# Patient Record
Sex: Female | Born: 1945 | Race: White | Hispanic: No | Marital: Married | Smoking: Former smoker
Health system: Southern US, Community
[De-identification: ages and names within clinical notes are randomized; demographics above are authoritative.]

## PROBLEM LIST (undated history)

## (undated) DIAGNOSIS — Z87442 Personal history of urinary calculi: Secondary | ICD-10-CM

## (undated) DIAGNOSIS — K219 Gastro-esophageal reflux disease without esophagitis: Secondary | ICD-10-CM

## (undated) DIAGNOSIS — F32A Depression, unspecified: Secondary | ICD-10-CM

## (undated) DIAGNOSIS — I82409 Acute embolism and thrombosis of unspecified deep veins of unspecified lower extremity: Secondary | ICD-10-CM

## (undated) DIAGNOSIS — I739 Peripheral vascular disease, unspecified: Secondary | ICD-10-CM

## (undated) DIAGNOSIS — Z9889 Other specified postprocedural states: Secondary | ICD-10-CM

## (undated) DIAGNOSIS — I10 Essential (primary) hypertension: Secondary | ICD-10-CM

## (undated) DIAGNOSIS — Z8619 Personal history of other infectious and parasitic diseases: Secondary | ICD-10-CM

## (undated) DIAGNOSIS — I779 Disorder of arteries and arterioles, unspecified: Secondary | ICD-10-CM

## (undated) DIAGNOSIS — F329 Major depressive disorder, single episode, unspecified: Secondary | ICD-10-CM

## (undated) DIAGNOSIS — D6851 Activated protein C resistance: Secondary | ICD-10-CM

## (undated) DIAGNOSIS — G4730 Sleep apnea, unspecified: Secondary | ICD-10-CM

## (undated) DIAGNOSIS — I219 Acute myocardial infarction, unspecified: Secondary | ICD-10-CM

## (undated) DIAGNOSIS — N179 Acute kidney failure, unspecified: Secondary | ICD-10-CM

## (undated) DIAGNOSIS — L03115 Cellulitis of right lower limb: Secondary | ICD-10-CM

## (undated) DIAGNOSIS — Z8673 Personal history of transient ischemic attack (TIA), and cerebral infarction without residual deficits: Secondary | ICD-10-CM

## (undated) DIAGNOSIS — J189 Pneumonia, unspecified organism: Secondary | ICD-10-CM

## (undated) DIAGNOSIS — E785 Hyperlipidemia, unspecified: Secondary | ICD-10-CM

## (undated) DIAGNOSIS — R112 Nausea with vomiting, unspecified: Secondary | ICD-10-CM

## (undated) DIAGNOSIS — R569 Unspecified convulsions: Secondary | ICD-10-CM

## (undated) DIAGNOSIS — I2510 Atherosclerotic heart disease of native coronary artery without angina pectoris: Secondary | ICD-10-CM

## (undated) DIAGNOSIS — E1165 Type 2 diabetes mellitus with hyperglycemia: Secondary | ICD-10-CM

## (undated) HISTORY — PX: BACK SURGERY: SHX140

## (undated) HISTORY — DX: Disorder of arteries and arterioles, unspecified: I77.9

## (undated) HISTORY — DX: Unspecified convulsions: R56.9

## (undated) HISTORY — PX: CATARACT EXTRACTION: SUR2

## (undated) HISTORY — PX: CHOLECYSTECTOMY: SHX55

## (undated) HISTORY — DX: Personal history of other infectious and parasitic diseases: Z86.19

## (undated) HISTORY — PX: TUBAL LIGATION: SHX77

## (undated) HISTORY — PX: OTHER SURGICAL HISTORY: SHX169

## (undated) HISTORY — PX: ABDOMINAL HYSTERECTOMY: SHX81

## (undated) HISTORY — DX: Peripheral vascular disease, unspecified: I73.9

## (undated) HISTORY — DX: Cellulitis of right lower limb: L03.115

## (undated) HISTORY — PX: LITHOTRIPSY: SUR834

## (undated) HISTORY — PX: TONSILLECTOMY: SUR1361

## (undated) HISTORY — DX: Acute kidney failure, unspecified: N17.9

## (undated) HISTORY — DX: Personal history of transient ischemic attack (TIA), and cerebral infarction without residual deficits: Z86.73

## (undated) HISTORY — PX: CARDIAC CATHETERIZATION: SHX172

---

## 1987-12-18 HISTORY — PX: ABDOMINAL HYSTERECTOMY: SHX81

## 2002-12-17 DIAGNOSIS — Z8673 Personal history of transient ischemic attack (TIA), and cerebral infarction without residual deficits: Secondary | ICD-10-CM

## 2002-12-17 HISTORY — DX: Personal history of transient ischemic attack (TIA), and cerebral infarction without residual deficits: Z86.73

## 2012-03-26 DIAGNOSIS — L403 Pustulosis palmaris et plantaris: Secondary | ICD-10-CM | POA: Insufficient documentation

## 2012-05-31 DIAGNOSIS — N200 Calculus of kidney: Secondary | ICD-10-CM | POA: Insufficient documentation

## 2014-06-22 DIAGNOSIS — M1990 Unspecified osteoarthritis, unspecified site: Secondary | ICD-10-CM | POA: Insufficient documentation

## 2015-12-18 HISTORY — PX: CATARACT EXTRACTION: SUR2

## 2016-01-23 DIAGNOSIS — E7253 Hyperoxaluria: Secondary | ICD-10-CM | POA: Insufficient documentation

## 2016-01-23 DIAGNOSIS — M109 Gout, unspecified: Secondary | ICD-10-CM | POA: Insufficient documentation

## 2016-01-23 DIAGNOSIS — R82994 Hypercalciuria: Secondary | ICD-10-CM | POA: Insufficient documentation

## 2016-01-23 DIAGNOSIS — R82993 Hyperuricosuria: Secondary | ICD-10-CM | POA: Insufficient documentation

## 2016-01-23 DIAGNOSIS — R34 Anuria and oliguria: Secondary | ICD-10-CM | POA: Insufficient documentation

## 2018-03-27 ENCOUNTER — Encounter: Payer: Self-pay | Admitting: Internal Medicine

## 2018-03-27 ENCOUNTER — Ambulatory Visit: Payer: Self-pay | Admitting: Internal Medicine

## 2018-03-27 DIAGNOSIS — E1165 Type 2 diabetes mellitus with hyperglycemia: Secondary | ICD-10-CM | POA: Diagnosis not present

## 2018-03-27 DIAGNOSIS — E1159 Type 2 diabetes mellitus with other circulatory complications: Secondary | ICD-10-CM

## 2018-03-27 LAB — POCT GLYCOSYLATED HEMOGLOBIN (HGB A1C): Hemoglobin A1C: 9.5

## 2018-03-27 MED ORDER — FREESTYLE LIBRE 14 DAY SENSOR MISC: 1.0000 | 11 refills | Status: AC

## 2018-03-27 MED ORDER — FREESTYLE LIBRE 14 DAY READER DEVI: 1.0000 | Freq: Once | 1 refills | Status: AC

## 2018-03-27 NOTE — Progress Notes (Signed)
Patient ID: Adriana [name], female   DOB: 27-May-1946, 72 y.o.   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 She moved from [place] to [place] in 10/2017. Insurance: Hydrologist  Last hemoglobin A1c was: No results found for: HGBA1C  Pt is on a regimen of: - Metformin 1000 mg 2x a day, with meals - Basaglar 24 units at bedtime - Humalog 12 units 3-4x a week (!), before meals  Pt checks her sugars 1x a month (!). - am: n/c - 2h after b'fast: n/c - before lunch: n/c - 2h after lunch: n/c - before dinner: n/c - 2h after dinner: n/c - bedtime: n/c - nighttime: n/c No lows. Lowest sugar was 68; she has hypoglycemia awareness at 70s.  Highest sugar was 215.  Glucometer: One Touch  Pt's meals are: - Brunch: leftovers from dinner, sausage or bacon + scrambled eggs + toast, spaghetti and meatballs, chicken salad + croissant, sandwich - Dinner: hamburger patties, red meat, chicken + fries + veggies - Snacks: fruit, 3 desserts a week; 2 chocolate chip cookies a day  - no CKD, last BUN/creatinine:  No results found for: BUN, CREATININE  On Lisinopril.  - last set of lipids: No results found for: CHOL, HDL, LDLCALC, LDLDIRECT, TRIG, CHOLHDL  On Simvastatin 40.  - last eye exam was in 2018. 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 + macular edema.  + glaucoma, s/p Sx and implants.  - no numbness and tingling in her feet.  Pt has FH of DM in B, MGM.  + palmar-plantar pustular psoriasis. + h/o kidney stones. + h/o thyroid nodule - s/p Benign Bx 2017  ROS: Constitutional: no weight gain/loss, no fatigue, no subjective hyperthermia/hypothermia Eyes: no blurry vision, no xerophthalmia ENT: no sore throat, no nodules palpated in throat, no dysphagia/odynophagia, no hoarseness Cardiovascular: no CP/SOB/palpitations/leg swelling Respiratory:  no cough/SOB Gastrointestinal: no N/V/D/C/+ heartburn Musculoskeletal: + Both: Muscle/joint aches Skin: + Rash on feet  Neurological: no tremors/numbness/tingling/dizziness Psychiatric: no depression/anxiety  Past medical history: Type 2 diabetes GERD Factor V Leiden mutation Osteoarthritis Pulmonary-plantar pustular psoriasis History of kidney stones History of stroke in 2004  Past surgical history: Double carotid artery surgery 2004 Cholecystectomy Lithotripsy- 3 times Back surgery Hysterectomy Tubal ligation Root canals-3 surgeries Cataract surgery x2 with implants  Social History   Socioeconomic History  . Marital status:  Married    Spouse name: Not on file  . Number of children: 3  . Years of education: Not on file  . Highest education level: Not on file  Occupational History  .  Retired from [employer]  . Smoking status:  Former smoker, quit 2004  Substance and Sexual Activity  . Alcohol use: No  . Drug use: No  Lifestyle  Relationships  . Social connections:    Talks on phone: Not on file    Gets together: Not on file    Attends religious service: Not on file    Active member of club or organization: Not on file    Attends meetings of clubs or organizations: Not on file    Relationship status: Not on file  . Intimate partner violence:    Fear of current or ex partner: Not on file    Emotionally abused: Not on file    Physically abused: Not on file    Forced sexual activity: Not on file  Other Topics Concern  . Not on file  Social History Narrative  . Not on file   Current Outpatient Medications on File Prior to Visit  Medication Sig Dispense Refill  . chlorthalidone (HYGROTON) 25 MG tablet Take 25 mg by mouth daily.    . Cholecalciferol (VITAMIN D) 2000 units CAPS Take by mouth.    . clobetasol cream (TEMOVATE) 0.05 % Apply 1 application topically as needed.    . diclofenac sodium (VOLTAREN) 1 % GEL Apply topically as  needed.    . Insulin Glargine (BASAGLAR KWIKPEN) 100 UNIT/ML SOPN Inject 24 Units into the skin at bedtime.    . Insulin Lispro (HUMALOG KWIKPEN [place]) Inject 12 Units into the skin 3 (three) times daily before meals.    Marland Kitchen. lisinopril (PRINIVIL,ZESTRIL) 5 MG tablet Take 5 mg by mouth daily.    . metFORMIN (GLUCOPHAGE) 1000 MG tablet Take 1,000 mg by mouth 2 (two) times daily with a meal.    . potassium citrate (UROCIT-K) 10 MEQ (1080 MG) SR tablet Take 10 mEq by mouth 3 (three) times daily with meals.    . promethazine (PHENERGAN) 25 MG tablet Take 12.5 mg by mouth every 6 (six) hours as needed for nausea or vomiting.    . simvastatin (ZOCOR) 40 MG tablet Take 40 mg by mouth daily.    . tacrolimus (PROTOPIC) 0.1 % ointment Apply topically as needed.    . warfarin (COUMADIN) 5 MG tablet Take 5 mg by mouth daily.     No current facility-administered medications on file prior to visit.    Allergies  Allergen Reactions  . Anesthesia S-I-60 Nausea And Vomiting  . Aspirin Nausea And Vomiting    Family history: See HPI + Mother with hypertension, rheumatic fever.  Father with lung cancer.  PE: There were no vitals taken for this visit.  Patient left before obtaining vital signs. Wt Readings from Last 3 Encounters:  No data found for Wt   Constitutional: Normal weight, in NAD Eyes: PERRLA, EOMI, no exophthalmos ENT: moist mucous membranes, no thyromegaly, no cervical lymphadenopathy Cardiovascular: RRR, No MRG Respiratory: CTA B Gastrointestinal: abdomen soft, NT, ND, BS+ Musculoskeletal: no deformities, strength intact in all 4 Skin: moist, warm, no rashes Neurological: no tremor with outstretched hands, DTR normal in all 4  ASSESSMENT: 1. DM2, insulin-dependent, uncontrolled, with complications - + carotid stenosis >> 2004: surgery - + cerebrovascular ds. 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 Patient with long-standing, uncontrolled  diabetes, on basal insulin regimen only, which is insufficient.  She is supposed to be on Humalog before meals, but she is taking this rarely.  She is also not having a good diet and not exercising.   I gave her instructions about improving her diet and we also discussed about the importance of checking sugars at least 2-3 times a day.  I suggested a freestyle libre CGM since the prescription to her pharmacy.  I demonstrated how this is used. - In the meantime, since I suspect that postprandial hyperglycemia is the cause for her uncontrolled diabetes (she is not checking sugars at home, but HbA1c today is high, at 9.5%), I suggested to start Trulicity once a week.  We will start low dose.  Discussed about benefits and possible side effects.  If she cannot obtain this or she cannot tolerate it, we will need to use Humalog before meals.  I gave her a more flexible regimen,  in case we need to use Humalog.  For now, we will continue Basaglar at the current dose and also metformin 2x 1000 mg  a day. - I suggested to:  Patient Instructions  Start checking sugars 3x a day, before meals and occasional at bedtime.  Please continue: - Basaglar 24 units at bedtime  Please start Trulicity 0.75 mg weekly. Let me know when you are close to running out to call in the higher dose to your pharmacy (1.5 mg).  If you cannot take this, then take: - Humalog: 8-12 units depending on the size of the meal  Please call and schedule an eye appt with Dr. Randon Goldsmith: [facility]:  Dr. Jeni Salles MD ?  Address: [address] [place], [place], [place] [address]  [phone]  Please return in 3 months with your sugar log.   - Strongly advised her to start checking sugars at different times of the day - check 2-3 times a day, rotating checks - given sugar log and advised how to fill it and to bring it at next appt  - given foot care handout and explained the principles  - given instructions for  hypoglycemia management "15-15 rule"  - advised for yearly eye exams >> recommended local ophthalmologist  - Return to clinic in 3 mo with sugar log   - time spent with the patient: 45 min, of which >50% was spent in obtaining information about her symptoms, reviewing her previous labs, evaluations, and treatments, counseling her about her condition (please see the discussed topics above), and developing a plan to further investigate it; she had a number of questions which I addressed.   Carlus Pavlov, MD PhD [facility] Endocrinology

## 2018-03-27 NOTE — Patient Instructions (Addendum)
Start checking sugars 3x a day, before meals and occasional at bedtime.  Please continue: - Basaglar 24 units at bedtime  Please start Trulicity 0.75 mg weekly. Let me know when you are close to running out to call in the higher dose to your pharmacy (1.5 mg).  If you cannot take this, then take: - Humalog: 8-12 units depending on the size of the meal  Please call and schedule an eye appt with Dr. Randon Goldsmith: [facility]:  Dr. Jeni Salles MD ?  Address: [address] [place], [place], [place] [address]  [phone]  Please return in 3 months with your sugar log.   PATIENT INSTRUCTIONS FOR TYPE 2 DIABETES:  **Please join MyChart!** - see attached instructions about how to join if you have not done so already.  DIET AND EXERCISE Diet and exercise is an important part of diabetic treatment.  We recommended aerobic exercise in the form of brisk walking (working between 40-60% of maximal aerobic capacity, similar to brisk walking) for 150 minutes per week (such as 30 minutes five days per week) along with 3 times per week performing 'resistance' training (using various gauge rubber tubes with handles) 5-10 exercises involving the major muscle groups (upper body, lower body and core) performing 10-15 repetitions (or near fatigue) each exercise. Start at half the above goal but build slowly to reach the above goals. If limited by weight, joint pain, or disability, we recommend daily walking in a swimming pool with water up to waist to reduce pressure from joints while allow for adequate exercise.    BLOOD GLUCOSES Monitoring your blood glucoses is important for continued management of your diabetes. Please check your blood glucoses 2-4 times a day: fasting, before meals and at bedtime (you can rotate these measurements - e.g. one day check before the 3 meals, the next day check before 2 of the meals and before bedtime, etc.).   HYPOGLYCEMIA (low blood sugar) Hypoglycemia is  usually a reaction to not eating, exercising, or taking too much insulin/ other diabetes drugs.  Symptoms include tremors, sweating, hunger, confusion, headache, etc. Treat IMMEDIATELY with 15 grams of Carbs: . 4 glucose tablets .  cup regular juice/soda . 2 tablespoons raisins . 4 teaspoons sugar . 1 tablespoon honey Recheck blood glucose in 15 mins and repeat above if still symptomatic/blood glucose <100.  RECOMMENDATIONS TO REDUCE YOUR RISK OF DIABETIC COMPLICATIONS: * Take your prescribed MEDICATION(S) * Follow a DIABETIC diet: Complex carbs, fiber rich foods, (monounsaturated and polyunsaturated) fats * AVOID saturated/trans fats, high fat foods, >2,300 mg salt per day. * EXERCISE at least 5 times a week for 30 minutes or preferably daily.  * DO NOT SMOKE OR DRINK more than 1 drink a day. * Check your FEET every day. Do not wear tightfitting shoes. Contact us if you develop an ulcer * See your EYE doctor once a year or more if needed * Get a FLU shot once a year * Get a PNEUMONIA vaccine once before and once after age 35 years  GOALS:  * Your Hemoglobin A1c of <7%  * fasting sugars need to be <130 * after meals sugars need to be <180 (2h after you start eating) * Your Systolic BP should be 140 or lower  * Your Diastolic BP should be 80 or lower  * Your HDL (Good Cholesterol) should be 40 or higher  * Your LDL (Bad Cholesterol) should be 100 or lower. * Your Triglycerides should be 150 or lower  *  Your Urine microalbumin (kidney function) should be <30 * Your Body Mass Index should be 25 or lower    Please consider the following ways to cut down carbs and fat and increase fiber and micronutrients in your diet: - substitute whole grain for white bread or pasta - substitute brown rice for white rice - substitute 90-calorie flat bread pieces for slices of bread when possible - substitute sweet potatoes or yams for white potatoes - substitute humus for margarine - substitute  tofu for cheese when possible - substitute almond or rice milk for regular milk (would not drink soy milk daily due to concern for soy estrogen influence on breast cancer risk) - substitute dark chocolate for other sweets when possible - substitute water - can add lemon or orange slices for taste - for diet sodas (artificial sweeteners will trick your body that you can eat sweets without getting calories and will lead you to overeating and weight gain in the long run) - do not skip breakfast or other meals (this will slow down the metabolism and will result in more weight gain over time)  - can try smoothies made from fruit and almond/rice milk in am instead of regular breakfast - can also try old-fashioned (not instant) oatmeal made with almond/rice milk in am - order the dressing on the side when eating salad at a restaurant (pour less than half of the dressing on the salad) - eat as little meat as possible - can try juicing, but should not forget that juicing will get rid of the fiber, so would alternate with eating raw veg./fruits or drinking smoothies - use as little oil as possible, even when using olive oil - can dress a salad with a mix of balsamic vinegar and lemon juice, for e.g. - use agave nectar, stevia sugar, or regular sugar rather than artificial sweateners - steam or broil/roast veggies  - snack on veggies/fruit/nuts (unsalted, preferably) when possible, rather than processed foods - reduce or eliminate aspartame in diet (it is in diet sodas, chewing gum, etc) Read the labels!  Try to read Dr. Katherina RightNeal Barnard's book: "Program for Reversing Diabetes" for other ideas for healthy eating.

## 2018-03-28 DIAGNOSIS — Z7901 Long term (current) use of anticoagulants: Secondary | ICD-10-CM | POA: Insufficient documentation

## 2018-03-28 DIAGNOSIS — K219 Gastro-esophageal reflux disease without esophagitis: Secondary | ICD-10-CM | POA: Insufficient documentation

## 2018-03-28 DIAGNOSIS — I6523 Occlusion and stenosis of bilateral carotid arteries: Secondary | ICD-10-CM | POA: Insufficient documentation

## 2018-03-28 DIAGNOSIS — Z720 Tobacco use: Secondary | ICD-10-CM | POA: Insufficient documentation

## 2018-03-28 DIAGNOSIS — M810 Age-related osteoporosis without current pathological fracture: Secondary | ICD-10-CM | POA: Insufficient documentation

## 2018-04-28 ENCOUNTER — Ambulatory Visit: Payer: Self-pay | Admitting: Internal Medicine

## 2018-04-29 ENCOUNTER — Emergency Department (HOSPITAL_COMMUNITY)
Admission: EM | Admit: 2018-04-29 | Discharge: 2018-04-30 | Disposition: A | Discharge status: Home / Self Care | Payer: Federal, State, Local not specified - PPO | Attending: Emergency Medicine | Admitting: Emergency Medicine

## 2018-04-29 DIAGNOSIS — M79604 Pain in right leg: Secondary | ICD-10-CM | POA: Diagnosis present

## 2018-04-29 DIAGNOSIS — Z87891 Personal history of nicotine dependence: Secondary | ICD-10-CM | POA: Insufficient documentation

## 2018-04-29 DIAGNOSIS — Z79899 Other long term (current) drug therapy: Secondary | ICD-10-CM | POA: Diagnosis not present

## 2018-04-29 DIAGNOSIS — L03115 Cellulitis of right lower limb: Secondary | ICD-10-CM | POA: Insufficient documentation

## 2018-04-30 ENCOUNTER — Other Ambulatory Visit: Payer: Self-pay

## 2018-04-30 ENCOUNTER — Encounter (HOSPITAL_COMMUNITY): Payer: Self-pay | Admitting: Emergency Medicine

## 2018-04-30 LAB — URINALYSIS, ROUTINE W REFLEX MICROSCOPIC
BILIRUBIN URINE: NEGATIVE
Glucose, UA: >=500 mg/dL — AB
Ketones, ur: 5 mg/dL — AB
NITRITE: NEGATIVE
Protein, ur: NEGATIVE mg/dL
SPECIFIC GRAVITY, URINE: 1.024 (ref 1.005–1.030)
pH: 5 (ref 5.0–8.0)

## 2018-04-30 LAB — BASIC METABOLIC PANEL
Anion gap: 11 (ref 5–15)
BUN: 25 mg/dL — ABNORMAL HIGH (ref 6–20)
CHLORIDE: 92 mmol/L — AB (ref 101–111)
CO2: 30 mmol/L (ref 22–32)
CREATININE: 1.03 mg/dL — AB (ref 0.44–1.00)
Calcium: 9.8 mg/dL (ref 8.9–10.3)
GFR calc non Af Amer: 54 mL/min — ABNORMAL LOW (ref >60)
Glucose, Bld: 310 mg/dL — ABNORMAL HIGH (ref 65–99)
Potassium: 4.6 mmol/L (ref 3.5–5.1)
Sodium: 133 mmol/L — ABNORMAL LOW (ref 135–145)

## 2018-04-30 LAB — CBC WITH DIFFERENTIAL/PLATELET
Abs Immature Granulocytes: 0.1 10*3/uL (ref 0.0–0.1)
Basophils Absolute: 0 10*3/uL (ref 0.0–0.1)
Basophils Relative: 0 %
EOS PCT: 0 %
Eosinophils Absolute: 0 10*3/uL (ref 0.0–0.7)
HEMATOCRIT: 45.2 % (ref 36.0–46.0)
HEMOGLOBIN: 14.7 g/dL (ref 12.0–15.0)
Immature Granulocytes: 0 %
LYMPHS ABS: 1.8 10*3/uL (ref 0.7–4.0)
LYMPHS PCT: 16 %
MCH: 29.2 pg (ref 26.0–34.0)
MCHC: 32.5 g/dL (ref 30.0–36.0)
MCV: 89.9 fL (ref 78.0–100.0)
MONOS PCT: 9 %
Monocytes Absolute: 1 10*3/uL (ref 0.1–1.0)
Neutro Abs: 8.6 10*3/uL — ABNORMAL HIGH (ref 1.7–7.7)
Neutrophils Relative %: 75 %
Platelets: 221 10*3/uL (ref 150–400)
RBC: 5.03 MIL/uL (ref 3.87–5.11)
RDW: 13.2 % (ref 11.5–15.5)
WBC: 11.5 10*3/uL — ABNORMAL HIGH (ref 4.0–10.5)

## 2018-04-30 LAB — CBG MONITORING, ED
GLUCOSE-CAPILLARY: 196 mg/dL — AB (ref 65–99)
Glucose-Capillary: 331 mg/dL — ABNORMAL HIGH (ref 65–99)

## 2018-04-30 MED ORDER — FENTANYL CITRATE (PF) 100 MCG/2ML IJ SOLN
50.0000 ug | Freq: Once | INTRAMUSCULAR | Status: AC
  Administered 2018-04-30: 50 ug via INTRAVENOUS
  Filled 2018-04-30: qty 2

## 2018-04-30 MED ORDER — ONDANSETRON 4 MG PO TBDP
4.0000 mg | ORAL_TABLET | Freq: Once | ORAL | Status: AC
  Administered 2018-04-30: 4 mg via ORAL
  Filled 2018-04-30: qty 1

## 2018-04-30 MED ORDER — SULFAMETHOXAZOLE-TRIMETHOPRIM 800-160 MG PO TABS
1.0000 | ORAL_TABLET | Freq: Once | ORAL | Status: AC
  Administered 2018-04-30: 1 via ORAL
  Filled 2018-04-30: qty 1

## 2018-04-30 MED ORDER — SULFAMETHOXAZOLE-TRIMETHOPRIM 800-160 MG PO TABS: 1.0000 | ORAL_TABLET | Freq: Two times a day (BID) | ORAL | 0 refills | Status: DC

## 2018-04-30 MED ORDER — INSULIN ASPART 100 UNIT/ML ~~LOC~~ SOLN
5.0000 [IU] | Freq: Once | SUBCUTANEOUS | Status: AC
  Administered 2018-04-30: 5 [IU] via INTRAVENOUS
  Filled 2018-04-30: qty 1

## 2018-04-30 MED ORDER — SODIUM CHLORIDE 0.9 % IV BOLUS
1000.0000 mL | Freq: Once | INTRAVENOUS | Status: AC
  Administered 2018-04-30: 1000 mL via INTRAVENOUS

## 2018-04-30 MED ORDER — SODIUM CHLORIDE 0.9 % IV BOLUS
500.0000 mL | Freq: Once | INTRAVENOUS | Status: AC
  Administered 2018-04-30: 500 mL via INTRAVENOUS

## 2018-04-30 MED ORDER — HYDROCODONE-ACETAMINOPHEN 5-325 MG PO TABS: 2.0000 | ORAL_TABLET | Freq: Four times a day (QID) | ORAL | 0 refills | Status: DC | PRN

## 2018-04-30 MED ORDER — ONDANSETRON 4 MG PO TBDP: 4.0000 mg | ORAL_TABLET | Freq: Four times a day (QID) | ORAL | 0 refills | Status: DC | PRN

## 2018-04-30 MED ORDER — HYDROCODONE-ACETAMINOPHEN 5-325 MG PO TABS
2.0000 | ORAL_TABLET | Freq: Once | ORAL | Status: AC
  Administered 2018-04-30: 2 via ORAL
  Filled 2018-04-30: qty 2

## 2018-04-30 MED ORDER — CEPHALEXIN 500 MG PO CAPS: 500.0000 mg | ORAL_CAPSULE | Freq: Four times a day (QID) | ORAL | 0 refills | Status: DC

## 2018-04-30 MED ORDER — CEPHALEXIN 250 MG PO CAPS
500.0000 mg | ORAL_CAPSULE | Freq: Once | ORAL | Status: AC
  Administered 2018-04-30: 500 mg via ORAL
  Filled 2018-04-30: qty 2

## 2018-04-30 NOTE — Discharge Instructions (Signed)
Please discontinue the steroids that you are prescribed today.

## 2018-04-30 NOTE — ED Provider Notes (Signed)
TIME SEEN: 3:01 AM  CHIEF COMPLAINT: Right lower extremity pain, nausea, chills, hypoglycemia  HPI: Patient is a 72 year old female with history of insulin-dependent diabetes, hypertension, factor V Leiden on Coumadin who presents to the emergency department with right lower extremity pain, redness and warmth.  Symptoms have been present for several days and progressively worsening.  Was seen at a hospital in [place] earlier today and had normal blood work and was told this was likely an allergic reaction and started on steroids.  States that this is driven her blood sugar up after only 1 dose of steroids and it was in the 250s earlier today.  States the redness, warmth has gotten worse and she is now having increasing pain, chills and nausea.  She had an ultrasound outside hospital that showed no DVT but did show an inguinal lymph node.  States that they were told by her daughter's dermatologist to come to the emergency department as this appeared to be more infectious in nature.  ROS: See HPI Constitutional: no fever; + chills Eyes: no drainage  ENT: no runny nose   Cardiovascular:  no chest pain  Resp: no SOB  GI: no vomiting GU: no dysuria Integumentary: no rash  Allergy: no hives  Musculoskeletal: no leg swelling  Neurological: no slurred speech ROS otherwise negative  PAST MEDICAL HISTORY/PAST SURGICAL HISTORY:  History reviewed. No pertinent past medical history.  MEDICATIONS:  Prior to Admission medications   Medication Sig Start Date End Date Taking? Authorizing Provider  chlorthalidone (HYGROTON) 25 MG tablet Take 25 mg by mouth daily.    [provider]  Cholecalciferol (VITAMIN D) 2000 units CAPS Take by mouth.    [provider]  clobetasol cream (TEMOVATE) 0.05 % Apply 1 application topically as needed.    [provider]  Continuous Blood Gluc Sensor (FREESTYLE LIBRE 14 DAY SENSOR) MISC 1 each by Does not apply route every 14 (fourteen)  days. Change every 2 weeks 03/27/18   Carlus Pavlov, MD  diclofenac sodium (VOLTAREN) 1 % GEL Apply topically as needed.    [provider]  Insulin Glargine (BASAGLAR KWIKPEN) 100 UNIT/ML SOPN Inject 24 Units into the skin at bedtime.    [provider]  Insulin Lispro (HUMALOG KWIKPEN [place]) Inject 12 Units into the skin 3 (three) times daily before meals.    [provider]  lisinopril (PRINIVIL,ZESTRIL) 5 MG tablet Take 5 mg by mouth daily.    [provider]  metFORMIN (GLUCOPHAGE) 1000 MG tablet Take 1,000 mg by mouth 2 (two) times daily with a meal.    [provider]  potassium citrate (UROCIT-K) 10 MEQ (1080 MG) SR tablet Take 10 mEq by mouth 3 (three) times daily with meals.    [provider]  promethazine (PHENERGAN) 25 MG tablet Take 12.5 mg by mouth every 6 (six) hours as needed for nausea or vomiting.    [provider]  simvastatin (ZOCOR) 40 MG tablet Take 40 mg by mouth daily.    [provider]  tacrolimus (PROTOPIC) 0.1 % ointment Apply topically as needed.    [provider]  warfarin (COUMADIN) 5 MG tablet Take 5 mg by mouth daily.    [provider]    ALLERGIES:  Allergies  Allergen Reactions  . Anesthesia S-I-60 Nausea And Vomiting  . Aspirin Nausea And Vomiting    SOCIAL HISTORY:  Social History   Tobacco Use  . Smoking status: Former Smoker    Last attempt to  quit: 2004    Years since quitting: 15.3  . Smokeless tobacco: Never Used  Substance Use Topics  . Alcohol use: Not Currently    FAMILY HISTORY: No family history on file.  EXAM: BP 122/73 (BP Location: Right Arm)   Pulse 95   Temp 98.2 F (36.8 C) (Oral)   Resp 18   Ht  (1.676 m)   Wt 63 kg (139 lb)   SpO2 96%   BMI 22.44 kg/m  CONSTITUTIONAL: Alert and oriented and responds appropriately to questions. Well-appearing; well-nourished HEAD: Normocephalic EYES: Conjunctivae clear, pupils appear  equal, EOMI ENT: normal nose; moist mucous membranes NECK: Supple, no meningismus, no nuchal rigidity, no LAD  CARD: RRR; S1 and S2 appreciated; no murmurs, no clicks, no rubs, no gallops RESP: Normal chest excursion without splinting or tachypnea; breath sounds clear and equal bilaterally; no wheezes, no rhonchi, no rales, no hypoxia or respiratory distress, speaking full sentences ABD/GI: Normal bowel sounds; non-distended; soft, non-tender, no rebound, no guarding, no peritoneal signs, no hepatosplenomegaly BACK:  The back appears normal and is non-tender to palpation, there is no CVA tenderness EXT: Patient has redness and warmth noted almost circumferentially around the distal right lower extremity just above the ankle.  There is no joint effusion.  Compartments are soft.  2+ DP pulses bilaterally.  Normal ROM in all joints; non-tender to palpation; no edema; normal capillary refill; no cyanosis, no calf tenderness or swelling    SKIN: Normal color for age and race; warm; no petechiae or purpura, no blisters or desquamation, no urticaria NEURO: Moves all extremities equally PSYCH: The patient's mood and manner are appropriate. Grooming and personal hygiene are appropriate.  MEDICAL DECISION MAKING: Patient here with what appears to be cellulitis.  I have reviewed the labs and imaging that was obtained from outside hospital.  Appears at that time showed a white blood cell count of 10.3, INR of 2.08, blood glucose of 259.  She had an ultrasound of the right lower extremity that showed no sonographic evidence of DVT but did show nonspecific mildly prominent lymph node in the right groin that measured 1.9 cm.  I am concerned more for cellulitis given her presentation.  Her blood glucose here is 331.  We will repeat labs today to ensure no DKA and give IV fluids, IV insulin.  We will start her on Keflex and Bactrim and give her pain and nausea medicine.  She is otherwise well-appearing, does not appear  toxic or septic at this time.   ED PROGRESS: Patient's labs show now a mild leukocytosis of 11.5 with left shift.  No DKA.  Blood sugar has improved to 196 after IV fluids and insulin.  She is comfortable plan to be discharged home on Keflex, Bactrim and with pain medication.  She will follow-up with her primary care physician.  Area of redness has been outlined with a tissue marker.  We discussed at length return precautions.  Patient and daughter are comfortable with this plan and appreciative of care.  Have advised her to discontinue the steroids that she was prescribed this morning.  At this time, I do not feel there is any life-threatening condition present. I have reviewed and discussed all results (EKG, imaging, lab, urine as appropriate) and exam findings with patient/family. I have reviewed nursing notes and appropriate previous records.  I feel the patient is safe to be discharged home without further emergent workup and can continue workup as an outpatient as needed. Discussed usual  and customary return precautions. Patient/family verbalize understanding and are comfortable with this plan.  Outpatient follow-up has been provided if needed. All questions have been answered.      Latoria Dry, Layla Maw, DO 04/30/18 [phone]

## 2018-04-30 NOTE — ED Triage Notes (Signed)
Pt states that she woke up with a rash and leg pain to her lower right leg radiating to her groin. Erythema present. Pt states she was seen in [place] at [facility] and had labs and [place] dont to R/O DVT. Pt was negative for DVT but had a swollen nymph node and no significant elevated labs. She was given steroids and discharged.

## 2018-05-01 ENCOUNTER — Encounter (HOSPITAL_COMMUNITY): Payer: Self-pay | Admitting: Emergency Medicine

## 2018-05-01 ENCOUNTER — Other Ambulatory Visit: Payer: Self-pay

## 2018-05-01 ENCOUNTER — Inpatient Hospital Stay (HOSPITAL_COMMUNITY)
Admission: EM | Admit: 2018-05-01 | Discharge: 2018-05-05 | DRG: 872 | Disposition: A | Discharge status: Home Health | Payer: Medicare Other | Source: Ambulatory Visit | Attending: Internal Medicine | Admitting: Internal Medicine

## 2018-05-01 ENCOUNTER — Emergency Department (HOSPITAL_COMMUNITY): Payer: Medicare Other

## 2018-05-01 DIAGNOSIS — F419 Anxiety disorder, unspecified: Secondary | ICD-10-CM | POA: Diagnosis present

## 2018-05-01 DIAGNOSIS — I82409 Acute embolism and thrombosis of unspecified deep veins of unspecified lower extremity: Secondary | ICD-10-CM | POA: Diagnosis present

## 2018-05-01 DIAGNOSIS — K2970 Gastritis, unspecified, without bleeding: Secondary | ICD-10-CM | POA: Diagnosis present

## 2018-05-01 DIAGNOSIS — I639 Cerebral infarction, unspecified: Secondary | ICD-10-CM | POA: Diagnosis not present

## 2018-05-01 DIAGNOSIS — N179 Acute kidney failure, unspecified: Secondary | ICD-10-CM | POA: Diagnosis present

## 2018-05-01 DIAGNOSIS — L03115 Cellulitis of right lower limb: Secondary | ICD-10-CM | POA: Diagnosis present

## 2018-05-01 DIAGNOSIS — E785 Hyperlipidemia, unspecified: Secondary | ICD-10-CM

## 2018-05-01 DIAGNOSIS — T50905A Adverse effect of unspecified drugs, medicaments and biological substances, initial encounter: Secondary | ICD-10-CM | POA: Diagnosis present

## 2018-05-01 DIAGNOSIS — E1159 Type 2 diabetes mellitus with other circulatory complications: Secondary | ICD-10-CM | POA: Diagnosis present

## 2018-05-01 DIAGNOSIS — Z86718 Personal history of other venous thrombosis and embolism: Secondary | ICD-10-CM

## 2018-05-01 DIAGNOSIS — M25571 Pain in right ankle and joints of right foot: Secondary | ICD-10-CM

## 2018-05-01 DIAGNOSIS — I700 Atherosclerosis of aorta: Secondary | ICD-10-CM | POA: Diagnosis present

## 2018-05-01 DIAGNOSIS — G4733 Obstructive sleep apnea (adult) (pediatric): Secondary | ICD-10-CM | POA: Diagnosis present

## 2018-05-01 DIAGNOSIS — D6851 Activated protein C resistance: Secondary | ICD-10-CM

## 2018-05-01 DIAGNOSIS — E1165 Type 2 diabetes mellitus with hyperglycemia: Secondary | ICD-10-CM

## 2018-05-01 DIAGNOSIS — Z7901 Long term (current) use of anticoagulants: Secondary | ICD-10-CM | POA: Diagnosis not present

## 2018-05-01 DIAGNOSIS — Z823 Family history of stroke: Secondary | ICD-10-CM

## 2018-05-01 DIAGNOSIS — I10 Essential (primary) hypertension: Secondary | ICD-10-CM | POA: Diagnosis present

## 2018-05-01 DIAGNOSIS — R1084 Generalized abdominal pain: Secondary | ICD-10-CM | POA: Diagnosis not present

## 2018-05-01 DIAGNOSIS — Z801 Family history of malignant neoplasm of trachea, bronchus and lung: Secondary | ICD-10-CM

## 2018-05-01 DIAGNOSIS — I739 Peripheral vascular disease, unspecified: Secondary | ICD-10-CM | POA: Diagnosis not present

## 2018-05-01 DIAGNOSIS — I872 Venous insufficiency (chronic) (peripheral): Secondary | ICD-10-CM | POA: Diagnosis present

## 2018-05-01 DIAGNOSIS — R6520 Severe sepsis without septic shock: Secondary | ICD-10-CM | POA: Diagnosis present

## 2018-05-01 DIAGNOSIS — I959 Hypotension, unspecified: Secondary | ICD-10-CM | POA: Diagnosis present

## 2018-05-01 DIAGNOSIS — R109 Unspecified abdominal pain: Secondary | ICD-10-CM | POA: Diagnosis present

## 2018-05-01 DIAGNOSIS — Z825 Family history of asthma and other chronic lower respiratory diseases: Secondary | ICD-10-CM

## 2018-05-01 DIAGNOSIS — Z87891 Personal history of nicotine dependence: Secondary | ICD-10-CM | POA: Diagnosis not present

## 2018-05-01 DIAGNOSIS — Z7952 Long term (current) use of systemic steroids: Secondary | ICD-10-CM

## 2018-05-01 DIAGNOSIS — Z8673 Personal history of transient ischemic attack (TIA), and cerebral infarction without residual deficits: Secondary | ICD-10-CM

## 2018-05-01 DIAGNOSIS — I708 Atherosclerosis of other arteries: Secondary | ICD-10-CM | POA: Diagnosis present

## 2018-05-01 DIAGNOSIS — I803 Phlebitis and thrombophlebitis of lower extremities, unspecified: Secondary | ICD-10-CM | POA: Diagnosis present

## 2018-05-01 DIAGNOSIS — F329 Major depressive disorder, single episode, unspecified: Secondary | ICD-10-CM | POA: Diagnosis present

## 2018-05-01 DIAGNOSIS — A419 Sepsis, unspecified organism: Principal | ICD-10-CM

## 2018-05-01 DIAGNOSIS — R52 Pain, unspecified: Secondary | ICD-10-CM

## 2018-05-01 DIAGNOSIS — E860 Dehydration: Secondary | ICD-10-CM | POA: Diagnosis present

## 2018-05-01 DIAGNOSIS — Z794 Long term (current) use of insulin: Secondary | ICD-10-CM

## 2018-05-01 DIAGNOSIS — Z8619 Personal history of other infectious and parasitic diseases: Secondary | ICD-10-CM

## 2018-05-01 DIAGNOSIS — F32A Depression, unspecified: Secondary | ICD-10-CM | POA: Diagnosis present

## 2018-05-01 HISTORY — DX: Hyperlipidemia, unspecified: E78.5

## 2018-05-01 HISTORY — DX: Major depressive disorder, single episode, unspecified: F32.9

## 2018-05-01 HISTORY — DX: Personal history of other infectious and parasitic diseases: Z86.19

## 2018-05-01 HISTORY — DX: Depression, unspecified: F32.A

## 2018-05-01 HISTORY — DX: Acute embolism and thrombosis of unspecified deep veins of unspecified lower extremity: I82.409

## 2018-05-01 HISTORY — DX: Activated protein C resistance: D68.51

## 2018-05-01 HISTORY — DX: Essential (primary) hypertension: I10

## 2018-05-01 HISTORY — DX: Cellulitis of right lower limb: L03.115

## 2018-05-01 HISTORY — DX: Type 2 diabetes mellitus with hyperglycemia: E11.65

## 2018-05-01 LAB — CBC WITH DIFFERENTIAL/PLATELET
Abs Immature Granulocytes: 0 10*3/uL (ref 0.0–0.1)
Basophils Absolute: 0 10*3/uL (ref 0.0–0.1)
Basophils Relative: 0 %
EOS PCT: 0 %
Eosinophils Absolute: 0 10*3/uL (ref 0.0–0.7)
HEMATOCRIT: 40.2 % (ref 36.0–46.0)
HEMOGLOBIN: 12.9 g/dL (ref 12.0–15.0)
Immature Granulocytes: 0 %
LYMPHS ABS: 2.2 10*3/uL (ref 0.7–4.0)
LYMPHS PCT: 17 %
MCH: 29 pg (ref 26.0–34.0)
MCHC: 32.1 g/dL (ref 30.0–36.0)
MCV: 90.3 fL (ref 78.0–100.0)
MONO ABS: 1.2 10*3/uL — AB (ref 0.1–1.0)
MONOS PCT: 9 %
Neutro Abs: 9.7 10*3/uL — ABNORMAL HIGH (ref 1.7–7.7)
Neutrophils Relative %: 74 %
Platelets: 195 10*3/uL (ref 150–400)
RBC: 4.45 MIL/uL (ref 3.87–5.11)
RDW: 13.2 % (ref 11.5–15.5)
WBC: 13.2 10*3/uL — AB (ref 4.0–10.5)

## 2018-05-01 LAB — URINALYSIS, ROUTINE W REFLEX MICROSCOPIC
Bilirubin Urine: NEGATIVE
GLUCOSE, UA: NEGATIVE mg/dL
Hgb urine dipstick: NEGATIVE
Ketones, ur: NEGATIVE mg/dL
LEUKOCYTES UA: NEGATIVE
Nitrite: NEGATIVE
Protein, ur: NEGATIVE mg/dL
SPECIFIC GRAVITY, URINE: 1.018 (ref 1.005–1.030)
pH: 5 (ref 5.0–8.0)

## 2018-05-01 LAB — COMPREHENSIVE METABOLIC PANEL
ALBUMIN: 3.2 g/dL — AB (ref 3.5–5.0)
ALK PHOS: 115 U/L (ref 38–126)
ALT: 38 U/L (ref 14–54)
AST: 49 U/L — ABNORMAL HIGH (ref 15–41)
Anion gap: 14 (ref 5–15)
BILIRUBIN TOTAL: 0.4 mg/dL (ref 0.3–1.2)
BUN: 23 mg/dL — AB (ref 6–20)
CALCIUM: 8.4 mg/dL — AB (ref 8.9–10.3)
CO2: 22 mmol/L (ref 22–32)
CREATININE: 1.31 mg/dL — AB (ref 0.44–1.00)
Chloride: 99 mmol/L — ABNORMAL LOW (ref 101–111)
GFR calc Af Amer: 47 mL/min — ABNORMAL LOW (ref >60)
GFR calc non Af Amer: 40 mL/min — ABNORMAL LOW (ref >60)
GLUCOSE: 127 mg/dL — AB (ref 65–99)
Potassium: 4.1 mmol/L (ref 3.5–5.1)
Sodium: 135 mmol/L (ref 135–145)
Total Protein: 6.4 g/dL — ABNORMAL LOW (ref 6.5–8.1)

## 2018-05-01 LAB — I-STAT CG4 LACTIC ACID, ED
LACTIC ACID, VENOUS: 1.83 mmol/L (ref 0.5–1.9)
Lactic Acid, Venous: 2.65 mmol/L (ref 0.5–1.9)

## 2018-05-01 LAB — PROTIME-INR
INR: 3.14
Prothrombin Time: 32 seconds — ABNORMAL HIGH (ref 11.4–15.2)

## 2018-05-01 LAB — GLUCOSE, CAPILLARY: Glucose-Capillary: 82 mg/dL (ref 65–99)

## 2018-05-01 MED ORDER — SIMVASTATIN 40 MG PO TABS
40.0000 mg | ORAL_TABLET | Freq: Every day | ORAL | Status: DC
  Administered 2018-05-02 – 2018-05-05 (×4): 40 mg via ORAL
  Filled 2018-05-01 (×4): qty 1

## 2018-05-01 MED ORDER — ACETAMINOPHEN 325 MG PO TABS
650.0000 mg | ORAL_TABLET | Freq: Four times a day (QID) | ORAL | Status: DC | PRN
  Administered 2018-05-02 – 2018-05-04 (×4): 650 mg via ORAL
  Filled 2018-05-01 (×4): qty 2

## 2018-05-01 MED ORDER — INSULIN ASPART 100 UNIT/ML ~~LOC~~ SOLN: 0.0000 [IU] | Freq: Three times a day (TID) | SUBCUTANEOUS | Status: DC

## 2018-05-01 MED ORDER — PROMETHAZINE HCL 25 MG PO TABS
12.5000 mg | ORAL_TABLET | Freq: Every day | ORAL | Status: DC
  Administered 2018-05-01 – 2018-05-04 (×4): 12.5 mg via ORAL
  Filled 2018-05-01 (×4): qty 1

## 2018-05-01 MED ORDER — VITAMIN D 1000 UNITS PO TABS
2000.0000 [IU] | ORAL_TABLET | Freq: Every day | ORAL | Status: DC
  Administered 2018-05-02 – 2018-05-05 (×4): 2000 [IU] via ORAL
  Filled 2018-05-01 (×4): qty 2

## 2018-05-01 MED ORDER — PIPERACILLIN-TAZOBACTAM 3.375 G IVPB 30 MIN
3.3750 g | Freq: Once | INTRAVENOUS | Status: AC
  Administered 2018-05-01: 3.375 g via INTRAVENOUS
  Filled 2018-05-01: qty 50

## 2018-05-01 MED ORDER — SODIUM CHLORIDE 0.9 % IV BOLUS
1000.0000 mL | Freq: Once | INTRAVENOUS | Status: AC
Start: 2018-05-01 — End: 2018-05-01
  Administered 2018-05-01: 1000 mL via INTRAVENOUS

## 2018-05-01 MED ORDER — HYDROCORTISONE NA SUCCINATE PF 100 MG IJ SOLR
50.0000 mg | Freq: Once | INTRAMUSCULAR | Status: AC
  Administered 2018-05-01: 50 mg via INTRAVENOUS
  Filled 2018-05-01: qty 2

## 2018-05-01 MED ORDER — ONDANSETRON HCL 4 MG/2ML IJ SOLN
4.0000 mg | Freq: Three times a day (TID) | INTRAMUSCULAR | Status: DC | PRN
Start: 2018-05-01 — End: 2018-05-05
  Administered 2018-05-02 – 2018-05-04 (×4): 4 mg via INTRAVENOUS
  Filled 2018-05-01 (×4): qty 2

## 2018-05-01 MED ORDER — ACETAMINOPHEN 650 MG RE SUPP: 650.0000 mg | Freq: Four times a day (QID) | RECTAL | Status: DC | PRN

## 2018-05-01 MED ORDER — WARFARIN - PHARMACIST DOSING INPATIENT
Freq: Every day | Status: DC
  Administered 2018-05-03 – 2018-05-05 (×2)

## 2018-05-01 MED ORDER — SODIUM CHLORIDE 0.9 % IV BOLUS
2000.0000 mL | Freq: Once | INTRAVENOUS | Status: AC
  Administered 2018-05-01: 2000 mL via INTRAVENOUS

## 2018-05-01 MED ORDER — SODIUM CHLORIDE 0.9 % IV SOLN
INTRAVENOUS | Status: DC
Start: 2018-05-01 — End: 2018-05-03
  Administered 2018-05-01 – 2018-05-03 (×2): via INTRAVENOUS

## 2018-05-01 MED ORDER — METRONIDAZOLE IN NACL 5-0.79 MG/ML-% IV SOLN
500.0000 mg | Freq: Three times a day (TID) | INTRAVENOUS | Status: DC
  Administered 2018-05-01 – 2018-05-04 (×8): 500 mg via INTRAVENOUS
  Filled 2018-05-01 (×8): qty 100

## 2018-05-01 MED ORDER — SODIUM CHLORIDE 0.9 % IV SOLN
1.0000 g | INTRAVENOUS | Status: DC
  Administered 2018-05-02 – 2018-05-04 (×3): 1 g via INTRAVENOUS
  Filled 2018-05-01 (×3): qty 10

## 2018-05-01 MED ORDER — VITAMIN D 50 MCG (2000 UT) PO CAPS: 2000.0000 [IU] | ORAL_CAPSULE | Freq: Every day | ORAL | Status: DC

## 2018-05-01 MED ORDER — HYDROCODONE-ACETAMINOPHEN 5-325 MG PO TABS
2.0000 | ORAL_TABLET | Freq: Four times a day (QID) | ORAL | Status: DC | PRN
  Administered 2018-05-01 – 2018-05-02 (×2): 2 via ORAL
  Filled 2018-05-01 (×2): qty 2

## 2018-05-01 MED ORDER — FENTANYL CITRATE (PF) 100 MCG/2ML IJ SOLN
25.0000 ug | Freq: Once | INTRAMUSCULAR | Status: AC
  Administered 2018-05-01: 25 ug via INTRAVENOUS
  Filled 2018-05-01: qty 2

## 2018-05-01 MED ORDER — ALPRAZOLAM 0.25 MG PO TABS
0.2500 mg | ORAL_TABLET | Freq: Three times a day (TID) | ORAL | Status: DC | PRN
  Administered 2018-05-02: 0.25 mg via ORAL
  Filled 2018-05-01: qty 1

## 2018-05-01 MED ORDER — VANCOMYCIN HCL IN DEXTROSE 1-5 GM/200ML-% IV SOLN
1000.0000 mg | Freq: Once | INTRAVENOUS | Status: AC
Start: 2018-05-01 — End: 2018-05-01
  Administered 2018-05-01: 1000 mg via INTRAVENOUS
  Filled 2018-05-01: qty 200

## 2018-05-01 MED ORDER — ONDANSETRON HCL 4 MG/2ML IJ SOLN
4.0000 mg | Freq: Once | INTRAMUSCULAR | Status: AC
  Administered 2018-05-01: 4 mg via INTRAVENOUS
  Filled 2018-05-01: qty 2

## 2018-05-01 MED ORDER — SODIUM CHLORIDE 0.9 % IV SOLN: 1.0000 g | INTRAVENOUS | Status: DC

## 2018-05-01 MED ORDER — HYDRALAZINE HCL 20 MG/ML IJ SOLN: 5.0000 mg | INTRAMUSCULAR | Status: DC | PRN

## 2018-05-01 MED ORDER — VANCOMYCIN HCL IN DEXTROSE 1-5 GM/200ML-% IV SOLN
1000.0000 mg | INTRAVENOUS | Status: DC
Start: 2018-05-02 — End: 2018-05-04
  Administered 2018-05-02 – 2018-05-03 (×2): 1000 mg via INTRAVENOUS
  Filled 2018-05-01 (×2): qty 200

## 2018-05-01 MED ORDER — BUPROPION HCL ER (XL) 150 MG PO TB24
150.0000 mg | ORAL_TABLET | Freq: Every day | ORAL | Status: DC
  Administered 2018-05-03 – 2018-05-05 (×3): 150 mg via ORAL
  Filled 2018-05-01 (×3): qty 1

## 2018-05-01 MED ORDER — ZOLPIDEM TARTRATE 5 MG PO TABS: 5.0000 mg | ORAL_TABLET | Freq: Every evening | ORAL | Status: DC | PRN

## 2018-05-01 MED ORDER — FENTANYL CITRATE (PF) 100 MCG/2ML IJ SOLN: 50.0000 ug | Freq: Once | INTRAMUSCULAR | Status: DC

## 2018-05-01 NOTE — ED Notes (Signed)
Pt alert and oriented x4. Ambulatory with assistance. Skin warm and dry. Respirations equal and unlabored. Abdomen soft and non distended. Pt has right foot and tenderness. Pt has redden area on posterior side of leg, area marked. Cap refill on toes is less than three seconds. Pt also has redden area on upper right thigh, area also marked, skin still intact.

## 2018-05-01 NOTE — ED Notes (Signed)
Pt given [place] sandwich and caffeine free coke

## 2018-05-01 NOTE — ED Notes (Signed)
Pt placed on 2L [place] due to sats 88% on room air

## 2018-05-01 NOTE — Progress Notes (Signed)
Pharmacy Antibiotic Note  Adriana [name] is a 72 y.o. female admitted on 05/01/2018 with cellulitis. Pharmacy has been consulted for vancomycin dosing. Patient also receiving ceftriaxone dosed per MD. WBC 13.2, temp 100 on admit to ED. Scr 1.31 (BLunknown - 0.6-0.8 in 2013, ~1 on 04/30/18), estimated CrCl ~40 mL/min.  Plan: Vancomycin  IV q24h. Goal trough 10-15 mcg/mL. F/u C&S, clinical status, renal function, de-escalation, LOT, vancomycin levels as indicated  Height:  (167.6 cm) Weight: 139 lb (63 kg) IBW/kg (Calculated) : 59.3  Temp (24hrs), Avg:99.2 F (37.3 C), Min:98.3 F (36.8 C), Max:100 F (37.8 C)  Recent Labs  Lab 04/30/18 0342 05/01/18 1705 05/01/18 1722 05/01/18 1911  WBC 11.5* 13.2*  --   --   CREATININE 1.03* 1.31*  --   --   LATICACIDVEN  --   --  2.65* 1.83    Estimated Creatinine Clearance: 37.4 mL/min (A) (by C-G formula based on SCr of 1.31 mg/dL (H)).    Allergies  Allergen Reactions  . Anesthesia S-I-60 Nausea And Vomiting  . Aspirin Nausea And Vomiting    Antimicrobials this admission: Vanc 5/16 >> CTX 5/16 >>  Dose adjustments this admission: None  Microbiology results: 5/16 BCx: pending  Thank you for allowing pharmacy to be a part of this patient's care.  Roderic Scarce Zigmund Daniel, PharmD PGY1 Pharmacy Resident Pager: [phone] 05/01/2018 9:27 PM

## 2018-05-01 NOTE — ED Notes (Signed)
X-ray at bedside

## 2018-05-01 NOTE — ED Triage Notes (Signed)
Pt sent in from doctors office for worsening cellulitis to her leg, NP called and spoke with this nurse stating that the redness on her leg was getting worse, pt was tachycardic, and febrile in the office.

## 2018-05-01 NOTE — ED Notes (Signed)
ED Provider at bedside. 

## 2018-05-01 NOTE — Progress Notes (Signed)
ANTICOAGULATION CONSULT NOTE - Initial Consult  Pharmacy Consult for Warfarin Indication: Factor V Leiden, hx DVT  Allergies  Allergen Reactions  . Anesthesia S-I-60 Nausea And Vomiting  . Aspirin Nausea And Vomiting   Patient Measurements: Height:  (167.6 cm) Weight: 139 lb (63 kg) IBW/kg (Calculated) : 59.3  Vital Signs: Temp: 100 F (37.8 C) (05/16 1741) Temp Source: Rectal (05/16 1741) BP: 96/47 (05/16 2015) Pulse Rate: 87 (05/16 2015)  Labs: Recent Labs    04/30/18 0342 05/01/18 1705  HGB 14.7 12.9  HCT 45.2 40.2  PLT 221 195  LABPROT  --  32.0*  INR  --  3.14  CREATININE 1.03* 1.31*   Estimated Creatinine Clearance: 37.4 mL/min (A) (by C-G formula based on SCr of 1.31 mg/dL (H)).  Assessment: 13 yoF on warfarin PTA for h/o Factor V Leiden and DVT. Pharmacy consulted to dose warfarin upon admission. Last dose taken 5/16 at 1500. INR currently 3.14. CBC WNL, no bleeding noted. Patient currently NPO.  PTA warfarin regimen:  PO daily (admit INR 3.14)  Goal of Therapy:  INR 2-3 Monitor platelets by anticoagulation protocol: Yes   Plan:  Warfarin already taken today - will get INR in AM and reassess Daily INR Monitor for s/sx of bleeding  Erin N. Zigmund Daniel, PharmD PGY1 Pharmacy Resident Pager: [phone] 05/01/2018,9:43 PM

## 2018-05-01 NOTE — ED Provider Notes (Signed)
Patient placed in Quick Look pathway, seen and evaluated   Chief Complaint: Worsening redness to right leg  HPI:   Adriana [name] is a 72 y.o. female sent by PCP for worsening cellulitis to right lower extremity.  In the emergency department on 5/15 and started on Bactrim and Keflex.  Over the last 2 days, redness has progressively worsened.  She has had subjective fever and chills as well.  She was seen by her primary care doctor earlier today where NP from facility states that redness on her leg was worse.  She was tachycardic and febrile in the office.  She has also been complaining of nausea and vomiting.  She did get 12.5 IM Phenergan.  She took a pain pill which did have some acetaminophen in it, but no other antipyretics taken prior to arrival.   ROS: + Color change of skin, fever.   Physical Exam:   Gen: No distress  Neuro: Awake and Alert  Skin: Warm    Focused Exam: Area of erythema to right lower extremity was demarcated at prior ER visit.  It has spread outside of these lines and was marked again at PCP today.  It is warm to the touch.    Initiation of care has begun. The patient has been counseled on the process, plan, and necessity for staying for the completion/evaluation, and the remainder of the medical screening examination.  Hypotensive 80/47.  Signs of worsening cellulitis failed outpatient antibiotics.  Per nurse practitioner at PCP, was tachycardic and febrile in the office.  Started on IV fluids, blood cultures and labs ordered.  Nursing staff made aware of hypotension and need for bed as soon as available.     Ward, Chase Picket, PA-C 05/01/18 1709    Little, Ambrose Finland, MD 05/02/18 1249

## 2018-05-01 NOTE — ED Provider Notes (Signed)
[name] EMERGENCY DEPARTMENT Provider Note   CSN: [id number] Arrival date & time: 05/01/18  1648     History   Chief Complaint Chief Complaint  Patient presents with  . Cellulitis    HPI Adriana [name] is a 72 y.o. female.  The history is provided by the patient and a relative. No language interpreter was used.    Adriana [name] is a 72 y.o. female who presents to the Emergency Department complaining of leg pain. 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 She presents for evaluation of the leg swelling and pain. Four days ago she developed redness and pain in her right leg with a knot in her right groin. She was seen in the ED in [place] and given a one-time dose of steroids. She later presented to the emergency department at [facility] two days ago was started on antibiotics, pain medications and nausea medicines. After she was seen in the emergency department she states that the swelling and redness had improved in her leg until last night when the redness began to extend outside of the borders and she developed worsening nausea and chills. No reports of fever at home but she has not checked her temperature. She went to her PCPs office today who referred her to the emergency department due to worsening leukocytosis as well as hypertension. She did receive IM Phenergan prior to ED arrival. She has been compliant with the Bactrim and Keflex that she was prescribed a few days ago.  Past Medical History:  Diagnosis Date  . Depression   . DVT (deep venous thrombosis) (HCC)   . Essential hypertension   . Factor V Leiden mutation (HCC)   . HLD (hyperlipidemia)   . Poorly controlled type 2 diabetes mellitus (HCC)   . Stroke (cerebrum) [facility])     Patient Active Problem List   Diagnosis Date Noted  . Sepsis (HCC) 05/01/2018  . Cellulitis of right leg 05/01/2018  . Abdominal pain 05/01/2018  . Depression   . DVT (deep  venous thrombosis) (HCC)   . Essential hypertension   . Factor V Leiden mutation (HCC)   . HLD (hyperlipidemia)   . Poorly controlled type 2 diabetes mellitus (HCC)   . Stroke (cerebrum) (HCC)   . Poorly controlled type 2 diabetes mellitus with circulatory disorder (HCC) 03/27/2018    Past Surgical History:  Procedure Laterality Date  . ABDOMINAL HYSTERECTOMY    . BACK SURGERY    . Carotid artery endarterectomy    . CHOLECYSTECTOMY    . TONSILLECTOMY    . TUBAL LIGATION       OB History   None      Home Medications    Prior to Admission medications   Medication Sig Start Date End Date Taking? Authorizing Provider  buPROPion (WELLBUTRIN XL) 150 MG 24 hr tablet Take 150 mg by mouth daily. 03/28/18  Yes [provider]  cephALEXin (KEFLEX) 500 MG capsule Take 1 capsule (500 mg total) by mouth 4 (four) times daily. 04/30/18  Yes Ward, Layla Maw, DO  chlorthalidone (HYGROTON) 25 MG tablet Take 25 mg by mouth daily.   Yes [provider]  Cholecalciferol (VITAMIN D) 2000 units CAPS Take 2,000 Units by mouth daily.    Yes [provider]  clobetasol cream (TEMOVATE) 0.05 % Apply 1 application topically as needed.   Yes [provider]  diclofenac sodium (VOLTAREN) 1 % GEL Apply topically as needed.  Yes [provider]  HYDROcodone-acetaminophen (NORCO/VICODIN) 5-325 MG tablet Take 2 tablets by mouth every 6 (six) hours as needed. 04/30/18  Yes Ward, Baxter Hire N, DO  lisinopril (PRINIVIL,ZESTRIL) 5 MG tablet Take 5 mg by mouth daily.   Yes [provider]  metFORMIN (GLUCOPHAGE) 1000 MG tablet Take 1,000 mg by mouth 2 (two) times daily with a meal.   Yes [provider]  ondansetron (ZOFRAN ODT) 4 MG disintegrating tablet Take 1 tablet (4 mg total) by mouth every 6 (six) hours as needed. 04/30/18  Yes Ward, Baxter Hire N, DO  potassium citrate (UROCIT-K) 10 MEQ (1080 MG) SR tablet Take 10 mEq by mouth 3 (three) times daily with  meals.   Yes [provider]  predniSONE (DELTASONE) 20 MG tablet Take 20 mg by mouth 3 (three) times daily. 04/29/18  Yes [provider]  promethazine (PHENERGAN) 12.5 MG tablet Take 12.5 mg by mouth at bedtime. 04/14/18  Yes [provider]  simvastatin (ZOCOR) 40 MG tablet Take 40 mg by mouth daily.   Yes [provider]  sulfamethoxazole-trimethoprim (BACTRIM DS,SEPTRA DS) 800-160 MG tablet Take 1 tablet by mouth 2 (two) times daily for 7 days. 04/30/18 05/07/18 Yes Ward, Layla Maw, DO  warfarin (COUMADIN) 5 MG tablet Take 5 mg by mouth daily.   Yes [provider]  Continuous Blood Gluc Sensor (FREESTYLE LIBRE 14 DAY SENSOR) MISC 1 each by Does not apply route every 14 (fourteen) days. Change every 2 weeks 03/27/18   Carlus Pavlov, MD    Family History Family History  Problem Relation Age of Onset  . Stroke Mother   . Lung cancer Father   . COPD Brother     Social History Social History   Tobacco Use  . Smoking status: Former Smoker    Last attempt to quit: 2004    Years since quitting: 15.3  . Smokeless tobacco: Never Used  Substance Use Topics  . Alcohol use: Not Currently  . Drug use: Not Currently     Allergies   Anesthesia s-i-60 and Aspirin   Review of Systems Review of Systems  All other systems reviewed and are negative.    Physical Exam Updated Vital Signs BP (!) 95/57   Pulse 96   Temp (!) 100.9 F (38.3 C) (Rectal)   Resp 20   Ht  (1.676 m)   Wt 63 kg (139 lb)   SpO2 (!) 86%   BMI 22.44 kg/m   Physical Exam  Constitutional: She is oriented to person, place, and time. She appears well-developed and well-nourished.  HENT:  Head: Normocephalic and atraumatic.  Cardiovascular: Normal rate and regular rhythm.  No murmur heard. Pulmonary/Chest: Effort normal and breath sounds normal. No respiratory distress.  Abdominal: Soft. There is no tenderness. There is no rebound and no guarding.    Musculoskeletal:  Right inguinal region with firm erythema and integration. There is mild local tenderness. There is no crepitance or abscess. 2+ DP pulses bilaterally. Right calf with area of erythema edema and induration with streaking proximately.  Neurological: She is alert and oriented to person, place, and time.  Skin: Skin is warm and dry.  Psychiatric: She has a normal mood and affect. Her behavior is normal.  Nursing note and vitals reviewed.    ED Treatments / Results  Labs (all labs ordered are listed, but only abnormal results are displayed) Labs Reviewed  COMPREHENSIVE METABOLIC PANEL - Abnormal; Notable for the following components:  Result Value   Chloride 99 (*)    Glucose, Bld 127 (*)    BUN 23 (*)    Creatinine, Ser 1.31 (*)    Calcium 8.4 (*)    Total Protein 6.4 (*)    Albumin 3.2 (*)    AST 49 (*)    GFR calc non Af Amer 40 (*)    GFR calc Af Amer 47 (*)    All other components within normal limits  CBC WITH DIFFERENTIAL/PLATELET - Abnormal; Notable for the following components:   WBC 13.2 (*)    Neutro Abs 9.7 (*)    Monocytes Absolute 1.2 (*)    All other components within normal limits  PROTIME-INR - Abnormal; Notable for the following components:   Prothrombin Time 32.0 (*)    All other components within normal limits  I-STAT CG4 LACTIC ACID, ED - Abnormal; Notable for the following components:   Lactic Acid, Venous 2.65 (*)    All other components within normal limits  CULTURE, BLOOD (ROUTINE X 2)  CULTURE, BLOOD (ROUTINE X 2)  MRSA PCR SCREENING  URINALYSIS, ROUTINE W REFLEX MICROSCOPIC  GLUCOSE, CAPILLARY  LACTIC ACID, PLASMA  PROCALCITONIN  CORTISOL-AM, BLOOD  LIPASE, BLOOD  C-REACTIVE PROTEIN  SEDIMENTATION RATE  PROTIME-INR  I-STAT CG4 LACTIC ACID, ED    EKG EKG Interpretation  Date/Time:  Thursday May 01 2018 17:17:46 EDT Ventricular Rate:  92 PR Interval:    QRS Duration: 92 QT Interval:  361 QTC Calculation: 447 R  Axis:   87 Text Interpretation:  Sinus rhythm Left atrial enlargement Borderline right axis deviation Baseline wander in lead(s) I II aVR V2 V3 V4 V5 V6 Confirmed by Tilden Fossa [phone]) on 05/01/2018 5:41:52 PM   Radiology Dg Chest Port 1 View  Result Date: 05/01/2018 CLINICAL DATA:  Fever, sepsis. EXAM: PORTABLE CHEST 1 VIEW COMPARISON:  None. FINDINGS: Cardiomediastinal silhouette is normal. Mediastinal contours appear intact. Calcific atherosclerotic disease of the aorta. There is no evidence of focal airspace consolidation, pleural effusion or pneumothorax. Osseous structures are without acute abnormality. Soft tissues are grossly normal. IMPRESSION: No active disease. Calcific atherosclerotic disease of the aorta. Electronically Signed   By: Ted Mcalpine M.D.   On: 05/01/2018 18:25    Procedures Procedures (including critical care time) CRITICAL CARE Performed by: Tilden Fossa   Total critical care time: 35 minutes  Critical care time was exclusive of separately billable procedures and treating other patients.  Critical care was necessary to treat or prevent imminent or life-threatening deterioration.  Critical care was time spent personally by me on the following activities: development of treatment plan with patient and/or surrogate as well as nursing, discussions with consultants, evaluation of patient's response to treatment, examination of patient, obtaining history from patient or surrogate, ordering and performing treatments and interventions, ordering and review of laboratory studies, ordering and review of radiographic studies, pulse oximetry and re-evaluation of patient's condition.  Medications Ordered in ED Medications  ondansetron (ZOFRAN) injection 4 mg (has no administration in time range)  0.9 %  sodium chloride infusion ( Intravenous New Bag/Given 05/01/18 2343)  metroNIDAZOLE (FLAGYL) IVPB 500 mg (0 mg Intravenous Stopped 05/02/18 0042)  insulin aspart  (novoLOG) injection 0-9 Units (has no administration in time range)  cefTRIAXone (ROCEPHIN) 1 g in sodium chloride 0.9 % 100 mL IVPB (has no administration in time range)  acetaminophen (TYLENOL) tablet 650 mg (650 mg Oral Given 05/02/18 0044)    Or  acetaminophen (TYLENOL) suppository 650 mg (  Rectal See Alternative 05/02/18 0044)  hydrALAZINE (APRESOLINE) injection 5 mg (has no administration in time range)  zolpidem (AMBIEN) tablet 5 mg (has no administration in time range)  ALPRAZolam (XANAX) tablet 0.25 mg (has no administration in time range)  buPROPion (WELLBUTRIN XL) 24 hr tablet 150 mg (has no administration in time range)  HYDROcodone-acetaminophen (NORCO/VICODIN) 5-325 MG per tablet 2 tablet (2 tablets Oral Given 05/01/18 2329)  promethazine (PHENERGAN) tablet 12.5 mg (12.5 mg Oral Given 05/01/18 2330)  simvastatin (ZOCOR) tablet 40 mg (has no administration in time range)  vancomycin (VANCOCIN) IVPB 1000 mg/200 mL premix (has no administration in time range)  cholecalciferol (VITAMIN D) tablet 2,000 Units (2,000 Units Oral Not Given 05/01/18 2343)  Warfarin - Pharmacist Dosing Inpatient (has no administration in time range)  sodium chloride 0.9 % bolus 500 mL (has no administration in time range)  sodium chloride 0.9 % bolus 2,000 mL (0 mLs Intravenous Stopped 05/01/18 1954)  piperacillin-tazobactam (ZOSYN) IVPB 3.375 g (0 g Intravenous Stopped 05/01/18 1844)  vancomycin (VANCOCIN) IVPB 1000 mg/200 mL premix (0 mg Intravenous Stopped 05/01/18 1900)  ondansetron (ZOFRAN) injection 4 mg (4 mg Intravenous Given 05/01/18 1748)  fentaNYL (SUBLIMAZE) injection 25 mcg (25 mcg Intravenous Given 05/01/18 2133)  sodium chloride 0.9 % bolus 1,000 mL (0 mLs Intravenous Stopped 05/01/18 2330)  hydrocortisone sodium succinate (SOLU-CORTEF) 100 MG injection 50 mg (50 mg Intravenous Given 05/01/18 2328)     Initial Impression / Assessment and Plan / ED Course  I have reviewed the triage vital signs and  the nursing notes.  Pertinent labs & imaging results that were available during my care of the patient were reviewed by me and considered in my medical decision making (see chart for details).    Records reviewed from [facility]. At their lab today she had a creatinine of 1.1, sodium 136, glucose 112, BUN 26, WBC 14, hemoglobin 13.1, platelet 197, INR 3.9. Vital signs in their office 100/44, heart rate 103, SPO2 93%, temperature 99.3.   Patient here for evaluation of fever, leg pain. She was hypotensive on ED arrival with exam concerning for cellulitis. No evidence of necrotizing skin infection or abscess at this time. She was started on IV fluids and broad-spectrum antibiotics. Her hypertension did resolve following IV fluid.  Patient and family updated of findings of studies and recommendation for admission and they are in agreement with plan.  Hospitalist consulted for admission.    Final Clinical Impressions(s) / ED Diagnoses   Final diagnoses:  Severe sepsis (HCC)  Cellulitis of right lower extremity    ED Discharge Orders    None       Tilden Fossa, MD 05/02/18 [phone]

## 2018-05-01 NOTE — H&P (Signed)
History and Physical    Adriana [name] OIN:[id number] DOB: September 24, 1946 DOA: 05/01/2018  Referring MD/NP/PA:   PCP: [name], MD   Patient coming from:  The patient is coming from home.  At baseline, pt is independent for most of ADL.   Chief Complaint: Right leg pain, fever, chills, nausea, vomiting, abdominal pain  HPI: Adriana [name] is a 72 y.o. female with medical history significant of hypertension, hyperlipidemia, diabetes mellitus, stroke, depression, OSA not on CPAP, factor V Leiden mutation and DVT on Coumadin, who presents with right leg pain, fever, chills, nausea, vomiting, abdominal pain.  Pt states that she has been having right leg pain for 2 days. The right leg is also swelling and erythematous. It has spreaded from lower leg to upper thigh.  Was seen at a hospital in [place] earlier yesterday. She was told this was likely an allergic reaction and started on steroids. She states that this is driven her blood sugar up at 250s after only 1 dose of steroids, therefore she stopped taking prednisone. Her symptoms have been progressively getting worse.  Her pain is constant, 8 out of 10 in severity, sharp, nonradiating.  She also developed fever and chills.  She was seen in ED yesterday, and started with Keflex and Bactrim for cellulitis, without improvement. Pt also has nausea vomiting and abdominal pain.  She vomited twice.  Her abdominal pain has been going on for almost a week.  She had loose stool yesterday, which has resolved.  Currently no diarrhea.  Abdominal pain is located in the upper abdomen, constant, moderate, nonradiating. Of note, she had an ultrasound outside hospital in [place] that showed no DVT, but did show an inguinal lymph node.  Patient had hypotension with a blood pressure 73/47, which improved to SBP>90s after 2L NS bolus.  Patient had one episode of O2 desaturation 88% in ED, which improved to 96% later on room air.   Currently patient does not have any respiratory distress, no cough, shortness of breath, or chest pain.  ED Course: pt was found to have WBC 13.2, lactic acid 2.65, 1.83, INR 3.14, negative urinalysis, acute renal injury with a creatinine 1.31, BUN 23, temperature 100, heart rate 87, respiration rate 24, negative chest x-ray.  Patient is admitted to stepdown as inpatient.  Review of Systems:   General: has fevers, chills, no body weight gain, has fatigue HEENT: no blurry vision, hearing changes or sore throat Respiratory: no dyspnea, coughing, wheezing CV: no chest pain, no palpitations GI: has nausea, vomiting, abdominal pain, no diarrhea, constipation GU: no dysuria, burning on urination, increased urinary frequency, hematuria  Ext: no leg edema Neuro: no unilateral weakness, numbness, or tingling, no vision change or hearing loss Skin: has right leg swelling, erythema, warmth and tenderness MSK: No muscle spasm, no deformity, no limitation of range of movement in spin Heme: No easy bruising.  Travel history: No recent long distant travel.  Allergy:  Allergies  Allergen Reactions  . Anesthesia S-I-60 Nausea And Vomiting  . Aspirin Nausea And Vomiting    Past Medical History:  Diagnosis Date  . Depression   . DVT (deep venous thrombosis) ([place])   . Essential hypertension   . Factor V Leiden mutation ([place])   . HLD (hyperlipidemia)   . Poorly controlled type 2 diabetes mellitus ([place])   . Stroke (cerebrum) [facility])     Past Surgical History:  Procedure Laterality Date  . ABDOMINAL HYSTERECTOMY    . BACK SURGERY    .  Carotid artery endarterectomy    . CHOLECYSTECTOMY    . TONSILLECTOMY    . TUBAL LIGATION      Social History:  reports that she quit smoking about 15 years ago. She has never used smokeless tobacco. She reports that she drank alcohol. She reports that she has current or past drug history.  Family History:  Family History  Problem Relation Age of Onset  .  Stroke Mother   . Lung cancer Father   . COPD Brother      Prior to Admission medications   Medication Sig Start Date End Date Taking? Authorizing Provider  oxyCODONE-acetaminophen (PERCOCET/ROXICET) 5-325 MG tablet Take 1 tablet by mouth every 4 (four) hours as needed for pain. 10/31/15  Yes [provider]  buPROPion (WELLBUTRIN XL) 150 MG 24 hr tablet Take 150 mg by mouth daily. 03/28/18   [provider]  cephALEXin (KEFLEX) 500 MG capsule Take 1 capsule (500 mg total) by mouth 4 (four) times daily. 04/30/18   Ward, Delice Bison, DO  chlorthalidone (HYGROTON) 25 MG tablet Take 25 mg by mouth daily.    [provider]  Cholecalciferol (VITAMIN D) 2000 units CAPS Take by mouth.    [provider]  clobetasol cream (TEMOVATE) 4.53 % Apply 1 application topically as needed.    [provider]  Continuous Blood Gluc Sensor (FREESTYLE LIBRE 14 DAY SENSOR) MISC 1 each by Does not apply route every 14 (fourteen) days. Change every 2 weeks 03/27/18   Philemon Kingdom, MD  diclofenac sodium (VOLTAREN) 1 % GEL Apply topically as needed.    [provider]  HYDROcodone-acetaminophen (NORCO/VICODIN) 5-325 MG tablet Take 2 tablets by mouth every 6 (six) hours as needed. 04/30/18   Ward, Delice Bison, DO  Insulin Glargine (BASAGLAR KWIKPEN) 100 UNIT/ML SOPN Inject 24 Units into the skin at bedtime.    [provider]  Insulin Lispro (HUMALOG KWIKPEN [place]) Inject 12 Units into the skin 3 (three) times daily before meals.    [provider]  lisinopril (PRINIVIL,ZESTRIL) 5 MG tablet Take 5 mg by mouth daily.    [provider]  metFORMIN (GLUCOPHAGE) 1000 MG tablet Take 1,000 mg by mouth 2 (two) times daily with a meal.    [provider]  ondansetron (ZOFRAN ODT) 4 MG disintegrating tablet Take 1 tablet (4 mg total) by mouth every 6 (six) hours as needed. 04/30/18   Ward, Delice Bison, DO  potassium citrate (UROCIT-K) 10 MEQ (1080  MG) SR tablet Take 10 mEq by mouth 3 (three) times daily with meals.    [provider]  predniSONE (DELTASONE) 20 MG tablet Take 20 mg by mouth 3 (three) times daily. 04/29/18   [provider]  promethazine (PHENERGAN) 12.5 MG tablet Take 12.5 mg by mouth at bedtime. 04/14/18   [provider]  promethazine (PHENERGAN) 25 MG tablet Take 12.5 mg by mouth every 6 (six) hours as needed for nausea or vomiting.    [provider]  simvastatin (ZOCOR) 40 MG tablet Take 40 mg by mouth daily.    [provider]  sulfamethoxazole-trimethoprim (BACTRIM DS,SEPTRA DS) 800-160 MG tablet Take 1 tablet by mouth 2 (two) times daily for 7 days. 04/30/18 05/07/18  Ward, Delice Bison, DO  tacrolimus (PROTOPIC) 0.1 % ointment Apply topically as needed.    [provider]  warfarin (COUMADIN) 5 MG tablet Take 5 mg by mouth daily.    [provider]    Physical Exam: Vitals:  05/02/18 0530 05/02/18 0545 05/02/18 0615 05/02/18 0630  BP: (!) 104/54 (!) 88/44 (!) 116/58 (!) 107/52  Pulse: 79 89 83 81  Resp: _0 Temp:      TempSrc:      SpO2: 99% 97% 91% (!) 89%  Weight:      Height:       General: Not in acute distress HEENT:       Eyes: PERRL, EOMI, no scleral icterus.       ENT: No discharge from the ears and nose, no pharynx injection, no tonsillar enlargement.        Neck: No JVD, no bruit, no mass felt. Heme: No neck lymph node enlargement. Cardiac: S1/S2, RRR, No murmurs, No gallops or rubs. Respiratory:  No rales, wheezing, rhonchi or rubs. GI: Soft, nondistended, has tenderness in upper abdomen, no rebound pain, no organomegaly, BS present. GU: No hematuria Ext: No pitting leg edema bilaterally. 2+DP/PT pulse bilaterally. Musculoskeletal: No joint deformities, No joint redness or warmth, no limitation of ROM in spin. Skin:  has right leg swelling, erythema, warmth and tenderness from right lower leg to upper thigh medially. Neuro:  Alert, oriented X3, cranial nerves II-XII grossly intact, moves all extremities normally.  Psych: Patient is not psychotic, no suicidal or hemocidal ideation.  Labs on Admission: I have personally reviewed following labs and imaging studies  CBC: Recent Labs  Lab 04/30/18 0342 05/01/18 1705  WBC 11.5* 13.2*  NEUTROABS 8.6* 9.7*  HGB 14.7 12.9  HCT 45.2 40.2  MCV 89.9 90.3  PLT 221 132   Basic Metabolic Panel: Recent Labs  Lab 04/30/18 0342 05/01/18 1705  NA 133* 135  K 4.6 4.1  CL 92* 99*  CO2 30 22  GLUCOSE 310* 127*  BUN 25* 23*  CREATININE 1.03* 1.31*  CALCIUM 9.8 8.4*   GFR: Estimated Creatinine Clearance: 37.4 mL/min (A) (by C-G formula based on SCr of 1.31 mg/dL (H)). Liver Function Tests: Recent Labs  Lab 05/01/18 1705  AST 49*  ALT 38  ALKPHOS 115  BILITOT 0.4  PROT 6.4*  ALBUMIN 3.2*   Recent Labs  Lab 05/02/18 0028  LIPASE 27   No results for input(s): AMMONIA in the last 168 hours. Coagulation Profile: Recent Labs  Lab 05/01/18 1705 05/02/18 0329  INR 3.14 3.53   Cardiac Enzymes: No results for input(s): CKTOTAL, CKMB, CKMBINDEX, TROPONINI in the last 168 hours. BNP (last 3 results) No results for input(s): PROBNP in the last 8760 hours. HbA1C: No results for input(s): HGBA1C in the last 72 hours. CBG: Recent Labs  Lab 04/30/18 0327 04/30/18 0523 05/01/18 2258  GLUCAP 331* 196* 82   Lipid Profile: No results for input(s): CHOL, HDL, LDLCALC, TRIG, CHOLHDL, LDLDIRECT in the last 72 hours. Thyroid Function Tests: No results for input(s): TSH, T4TOTAL, FREET4, T3FREE, THYROIDAB in the last 72 hours. Anemia Panel: No results for input(s): VITAMINB12, FOLATE, FERRITIN, TIBC, IRON, RETICCTPCT in the last 72 hours. Urine analysis:    Component Value Date/Time   COLORURINE YELLOW 05/01/2018 [place] 05/01/2018 1658   LABSPEC 1.018 05/01/2018 1658   PHURINE 5.0 05/01/2018 1658   GLUCOSEU NEGATIVE 05/01/2018 1658    HGBUR NEGATIVE 05/01/2018 [place] 05/01/2018 1658   KETONESUR NEGATIVE 05/01/2018 1658   PROTEINUR NEGATIVE 05/01/2018 1658   NITRITE NEGATIVE 05/01/2018 1658   LEUKOCYTESUR NEGATIVE 05/01/2018 1658   Sepsis Labs: _1 (procalcitonin:4,lacticidven:4) ) Recent Results (from the past 240 hour(s))  MRSA  PCR Screening     Status: None   Collection Time: 05/01/18 10:34 PM  Result Value Ref Range Status   MRSA by PCR NEGATIVE NEGATIVE Final    Comment:        The GeneXpert MRSA Assay (FDA approved for NASAL specimens only), is one component of a comprehensive MRSA colonization surveillance program. It is not intended to diagnose MRSA infection nor to guide or monitor treatment for MRSA infections. Performed at [facility], [place] [address], [place], [place] [address]      Radiological Exams on Admission: Ct Abdomen Pelvis Wo Contrast  Result Date: 05/02/2018 CLINICAL DATA:  Abdominal pain with nausea and vomiting EXAM: CT ABDOMEN AND PELVIS WITHOUT CONTRAST TECHNIQUE: Multidetector CT imaging of the abdomen and pelvis was performed following the standard protocol without IV contrast. Oral contrast was administered. COMPARISON:  None. FINDINGS: Lower chest: There is bibasilar atelectatic change. There are foci of coronary artery calcification evident. There is a small hiatal hernia. Hepatobiliary: No focal liver lesions are evident on this noncontrast enhanced study. Gallbladder is absent. There is no appreciable biliary duct dilatation. Pancreas: No pancreatic mass or inflammatory focus. Spleen: No splenic lesions are evident. Adrenals/Urinary Tract: Adrenals bilaterally appear unremarkable. Kidneys bilaterally show no appreciable mass or hydronephrosis on either side. There is mild perinephric stranding or along the lower pole left kidney with nearby slight fluid in the lateral conal fascia region. There is no renal or ureteral calculus on either side. Urinary  bladder is midline with wall thickness within normal limits. Stomach/Bowel: There are multiple descending colonic and sigmoid diverticula without diverticulitis. Mild wall thickening in the mid sigmoid region is likely due to muscular hypertrophy from chronic diverticulosis. No evidence of bowel obstruction. No free air or portal venous air appreciable. Vascular/Lymphatic: There is atherosclerotic calcification in the aorta and common iliac arteries. There is no abdominal aortic aneurysm. Major mesenteric vessels appear patent on this noncontrast enhanced study. Reproductive: Uterus is absent.  No evident pelvic mass. Other: Appendix appears normal. There is no abscess or ascites in the abdomen or pelvis. Musculoskeletal: There are no blastic or lytic bone lesions. There is degenerative change in the lumbar spine. No intramuscular or abdominal wall lesions are evident. IMPRESSION: 1. There is slight stranding along the lower pole left kidney with a small amount of fluid in the nearby lateral conal fascia on the left. No renal mass or hydronephrosis. The possibility of a degree of pyelonephritis on the left must be of concern. No evidence of abscess. Correlation with urinalysis advised. 2. No bowel obstruction. No abscess elsewhere in the abdomen or pelvis. Appendix appears normal. There is extensive left colonic diverticulosis without frank diverticulitis appreciable on this study. 3. Extensive aortoiliac atherosclerosis as well as coronary artery calcification. 4.  Small hiatal hernia. 5.  Gallbladder absent.  Uterus absent. Aortic Atherosclerosis (ICD10-I70.0). Electronically Signed   By: Lowella Grip III M.D.   On: 05/02/2018 07:29   Dg Chest Port 1 View  Result Date: 05/01/2018 CLINICAL DATA:  Fever, sepsis. EXAM: PORTABLE CHEST 1 VIEW COMPARISON:  None. FINDINGS: Cardiomediastinal silhouette is normal. Mediastinal contours appear intact. Calcific atherosclerotic disease of the aorta. There is no  evidence of focal airspace consolidation, pleural effusion or pneumothorax. Osseous structures are without acute abnormality. Soft tissues are grossly normal. IMPRESSION: No active disease. Calcific atherosclerotic disease of the aorta. Electronically Signed   By: Fidela Salisbury M.D.   On: 05/01/2018 18:25     EKG: Independently reviewed.  Sinus rhythm, QTC 447, LAD, nonspecific T wave change.    Assessment/Plan Principal Problem:   Cellulitis of right leg Active Problems:   Poorly controlled type 2 diabetes mellitus with circulatory disorder (HCC)   Depression   DVT (deep venous thrombosis) (HCC)   Essential hypertension   Factor V Leiden mutation (HCC)   HLD (hyperlipidemia)   Poorly controlled type 2 diabetes mellitus (HCC)   Stroke (cerebrum) (HCC)   Sepsis (HCC)   Abdominal pain   AKI (acute kidney injury) ([place])  Sepsis due to cellulitis of right leg: Patient admitted for sepsis with leukocytosis, hypotension, tachypnea.  Blood pressure responded to IV fluid.  Currently hemodynamically stable. No DVT by LE doppler from outside hospital  - will admit to SUD bed as inpt - Empiric antimicrobial treatment with vancomycin, Rocephin and Flagyl (patient received 1 dose of Zosyn) - PRN Zofran for nausea, Norco for pain - Blood cultures x 2  - ESR and CRP - will get Procalcitonin and trend lactic acid levels per sepsis protocol. - IVF: 3.5 L of NS bolus in ED, followed by 125 cc/h  Poorly controlled type 2 diabetes mellitus with circulatory disorder ([place]): Last A1c 9.5 on 03/27/18, poorly controled. Patient is taking metformin. -SSI  Depression and anxiety: Stable, no suicidal or homicidal ideations. -Continue home medications: Wellbutrin  DVT (deep venous thrombosis) and Factor V Leiden mutation  -coumadin per pharm  HTN:  --Hold home meds due to hypotension -IV hydralazine prn  HLD: -zocor  Stroke (cerebrum) ([place]): -on zocor  Abdominal pain: It is not clear.   Patient does not have acute abdominal on  physical examination. -check lipase  AKI: Likely due to prerenal secondary to dehydration and continuation of ACEI, diuretics, and bactrim - IVF as above - Follow up renal function by BMP - Hold lisinopril and DC'd Bactrim  DVT ppx: on Coumadin Code Status: Full code Family Communication:  Yes, patient's 2 daughters at bed side Disposition Plan:  Anticipate discharge back to previous home environment Consults called:  none Admission status:  SDU/inpation       Date of Service 05/02/2018    Ivor Costa [facility] Pager [phone]  If 7PM-7AM, please contact night-coverage [email or web address] Password [facility] 05/02/2018, 7:32 AM

## 2018-05-02 ENCOUNTER — Inpatient Hospital Stay (HOSPITAL_COMMUNITY): Payer: Medicare Other

## 2018-05-02 DIAGNOSIS — N179 Acute kidney failure, unspecified: Secondary | ICD-10-CM

## 2018-05-02 HISTORY — DX: Acute kidney failure, unspecified: N17.9

## 2018-05-02 LAB — SEDIMENTATION RATE: Sed Rate: 29 mm/hr — ABNORMAL HIGH (ref 0–22)

## 2018-05-02 LAB — PROTIME-INR
INR: 3.53
Prothrombin Time: 35.1 seconds — ABNORMAL HIGH (ref 11.4–15.2)

## 2018-05-02 LAB — GLUCOSE, CAPILLARY
Glucose-Capillary: 138 mg/dL — ABNORMAL HIGH (ref 65–99)
Glucose-Capillary: 149 mg/dL — ABNORMAL HIGH (ref 65–99)
Glucose-Capillary: 126 mg/dL — ABNORMAL HIGH (ref 65–99)
Glucose-Capillary: 171 mg/dL — ABNORMAL HIGH (ref 65–99)

## 2018-05-02 LAB — C-REACTIVE PROTEIN: CRP: 15.4 mg/dL — ABNORMAL HIGH (ref <1.0)

## 2018-05-02 LAB — MRSA PCR SCREENING: MRSA by PCR: NEGATIVE

## 2018-05-02 LAB — LIPASE, BLOOD: Lipase: 27 U/L (ref 11–51)

## 2018-05-02 LAB — CORTISOL-AM, BLOOD: Cortisol - AM: 32.3 ug/dL — ABNORMAL HIGH (ref 6.7–22.6)

## 2018-05-02 LAB — LACTIC ACID, PLASMA: LACTIC ACID, VENOUS: 1.6 mmol/L (ref 0.5–1.9)

## 2018-05-02 LAB — PROCALCITONIN

## 2018-05-02 MED ORDER — MORPHINE SULFATE (PF) 2 MG/ML IV SOLN
1.0000 mg | INTRAVENOUS | Status: DC | PRN
  Administered 2018-05-04: 2 mg via INTRAVENOUS
  Filled 2018-05-02: qty 1

## 2018-05-02 MED ORDER — SODIUM CHLORIDE 0.9 % IV BOLUS
500.0000 mL | Freq: Once | INTRAVENOUS | Status: AC
  Administered 2018-05-02: 500 mL via INTRAVENOUS

## 2018-05-02 MED ORDER — INSULIN ASPART 100 UNIT/ML ~~LOC~~ SOLN
0.0000 [IU] | Freq: Three times a day (TID) | SUBCUTANEOUS | Status: DC
  Administered 2018-05-03 (×2): 2 [IU] via SUBCUTANEOUS

## 2018-05-02 MED ORDER — TRAMADOL HCL 50 MG PO TABS
50.0000 mg | ORAL_TABLET | Freq: Four times a day (QID) | ORAL | Status: DC | PRN
  Administered 2018-05-02: 50 mg via ORAL
  Filled 2018-05-02: qty 1

## 2018-05-02 MED ORDER — OXYCODONE HCL 5 MG PO TABS
5.0000 mg | ORAL_TABLET | ORAL | Status: DC | PRN
  Administered 2018-05-02: 5 mg via ORAL
  Administered 2018-05-02: 10 mg via ORAL
  Administered 2018-05-03: 5 mg via ORAL
  Administered 2018-05-03 (×2): 10 mg via ORAL
  Administered 2018-05-04: 5 mg via ORAL
  Administered 2018-05-04: 10 mg via ORAL
  Administered 2018-05-04 – 2018-05-05 (×3): 5 mg via ORAL
  Filled 2018-05-02: qty 1
  Filled 2018-05-02: qty 2
  Filled 2018-05-02 (×2): qty 1
  Filled 2018-05-02 (×2): qty 2
  Filled 2018-05-02: qty 1
  Filled 2018-05-02: qty 2
  Filled 2018-05-02 (×2): qty 1

## 2018-05-02 MED ORDER — INSULIN ASPART 100 UNIT/ML ~~LOC~~ SOLN
0.0000 [IU] | Freq: Every day | SUBCUTANEOUS | Status: DC
  Administered 2018-05-03: 2 [IU] via SUBCUTANEOUS

## 2018-05-02 MED ORDER — ORAL CARE MOUTH RINSE
15.0000 mL | Freq: Two times a day (BID) | OROMUCOSAL | Status: DC
  Administered 2018-05-02 – 2018-05-04 (×4): 15 mL via OROMUCOSAL

## 2018-05-02 MED ORDER — IOPAMIDOL (ISOVUE-300) INJECTION 61%
INTRAVENOUS | Status: AC
  Filled 2018-05-02: qty 30

## 2018-05-02 MED ORDER — GABAPENTIN 100 MG PO CAPS
100.0000 mg | ORAL_CAPSULE | Freq: Two times a day (BID) | ORAL | Status: DC
  Administered 2018-05-02 – 2018-05-04 (×5): 100 mg via ORAL
  Filled 2018-05-02 (×5): qty 1

## 2018-05-02 NOTE — Plan of Care (Signed)
Pt progressing and reporting a decrease in redness and pain to right leg. Pt remains on IV abx at this time. Will continue to monitor.

## 2018-05-02 NOTE — Progress Notes (Signed)
ANTICOAGULATION CONSULT NOTE  Pharmacy Consult for Warfarin Indication: Factor V Leiden, hx DVT  Allergies  Allergen Reactions  . Anesthesia S-I-60 Nausea And Vomiting  . Aspirin Nausea And Vomiting   Patient Measurements: Height:  (167.6 cm) Weight: 139 lb (63 kg) IBW/kg (Calculated) : 59.3  Vital Signs: Temp: 98.1 F (36.7 C) (05/17 0913) Temp Source: Rectal (05/17 0913) BP: 121/65 (05/17 0913) Pulse Rate: 87 (05/17 0913)  Labs: Recent Labs    04/30/18 0342 05/01/18 1705 05/02/18 0329  HGB 14.7 12.9  --   HCT 45.2 40.2  --   PLT 221 195  --   LABPROT  --  32.0* 35.1*  INR  --  3.14 3.53  CREATININE 1.03* 1.31*  --    Estimated Creatinine Clearance: 37.4 mL/min (A) (by C-G formula based on SCr of 1.31 mg/dL (H)).  Assessment: 46 yoF on warfarin PTA for h/o Factor V Leiden and DVT. Pharmacy consulted to dose warfarin upon admission. Last dose taken 5/16 at 1500. INR supratherapeutic 3.14 on admit, now trending up to 3.53. CBC WNL, no bleeding documented. Noted, patient is on Flagyl - likely will cause INR to bump.  PTA warfarin regimen:  PO daily except 7.5mg  on Mon/Thurs per patient discussion with RN. Last dose 5/15 PTA  Goal of Therapy:  INR 2-3 Monitor platelets by anticoagulation protocol: Yes   Plan:  Hold warfarin again tonight with INR trending up (likely due to interaction with Flagyl) Monitor daily INR, CBC, s/sx bleeding  Babs Bertin, PharmD, BCPS Clinical Pharmacist 05/02/2018 12:46 PM

## 2018-05-02 NOTE — Progress Notes (Signed)
Initial Nutrition Assessment  DOCUMENTATION CODES:   Not applicable  INTERVENTION:    Continue Carbohydrate Modified diet  NUTRITION DIAGNOSIS:   Inadequate oral intake related to poor appetite as evidenced by meal completion < 25%  GOAL:   Patient will meet greater than or equal to 90% of their needs  MONITOR:   PO intake, Supplement acceptance, Labs, Skin, Weight trends, I & O's  REASON FOR ASSESSMENT:   Malnutrition Screening Tool  ASSESSMENT:   26 Female with PMH of DM, stroke, depression, OSA not on CPAP, factor V Leiden mutation and DVT on Coumadin who presented with 2 days of right leg pain, fever, chills, nausea, vomiting, and abdominal pain.  RD spoke with pt at bedside. Daughter present. Pt reports a poor appetite. Had a few bites of lunch. Says her grilled cheese was dry. Endorses a 12 lb weight loss x 1 month. She's not sure why. Believes it may be medication related.  Pt declined addition of nutrition supplements during hospitalization. Labs and medications reviewed. CBG's [phone].  NUTRITION - FOCUSED PHYSICAL EXAM:  Completed. No muscle or fat depletion noticed.  Diet Order:   Diet Order           Diet Carb Modified Fluid consistency: Thin; Room service appropriate? Yes  Diet effective now        EDUCATION NEEDS:   No education needs have been identified at this time  Skin:  Skin Assessment: Reviewed RN Assessment  Last BM:  5/16  Height:   Ht Readings from Last 1 Encounters:  05/01/18  (1.676 m)   Weight:   Wt Readings from Last 1 Encounters:  05/01/18 139 lb (63 kg)   Ideal Body Weight:  59 kg  BMI:  Body mass index is 22.44 kg/m.  Estimated Nutritional Needs:   Kcal:  1600-1800  Protein:  75-90 gm  Fluid:  1.6-1.8 L  Maureen Chatters, RD, LDN Pager #: [phone] After-Hours Pager #: [phone]

## 2018-05-02 NOTE — Progress Notes (Signed)
Neuro: Pt A&O X4, neuro exam WNL. Will continue to monitor.   Respiratory: Pt remains on RA, lungs clear/diminished.   Cardiovascular: Pt remains in sinus rhythm with no ectopy at this time. BP continues to be low at times but WDL. Pt afebrile with mild edema in right lower extremity.     GI/GU :Pt voiding in [facility] with no difficulty. Pt c/o abd pain and distention with poor appetite at this time. Pt given Zofran and reported relief.   Skin: Skin intact with no s/s of skin breakdown at this time. Pt able to reposition independently.   Pain: Pt c.o pain in right leg and reporting adequate relief with PRN medications.   Events: NO acute events throughout shift. Pts plan of care to continue with current regimen, Family updated and no further questions at this time.

## 2018-05-02 NOTE — Progress Notes (Signed)
[facility] TEAM 1 - Stepdown/ICU TEAM  Adriana [name]  ZOX:[id number] DOB: August 31, 1946 DOA: 05/01/2018 PCP: [name], MD    Brief Narrative:  72 y.o. female with a hx of hypertension, hyperlipidemia, diabetes mellitus, stroke, depression, OSA not on CPAP, factor V Leiden mutation and DVT on Coumadin who presented with 2 days of right leg pain, fever, chills, nausea, vomiting, and abdominal pain.  She was seen in ED the day prior to her admit and started with Keflex and Bactrim for cellulitis, without improvement.  In the ED she was found to have a blood pressure of 73/47, which improved to SBP>90s after 2L NS bolus. WBC 13.2, lactate 2.65, negative UA, crt 1.31, and an unremarkable CXR.    Significant Events: 5/16 admit   Subjective: The patient reports that she is feeling better already.  She feels that the erythema in her affected leg is much improved as are the spots tracking up her leg.  She denies chest pain or shortness of breath.  She feels her nausea vomiting and vague abdominal pain have nearly resolved.  Assessment & Plan:  Sepsis due to cellulitis of right leg no DVT by LE doppler from outside hospital - cont empiric antimicrobial treatment with vancomycin, Rocephin and Flagyl for now, but if continues to improve can likely narrow spectrum 5/18  Abdominal pain CT abdom unremarkable - pain has resolved - likely simply constitutional sx due to severity of cellulitis   Poorly controlled DM2 with circulatory disorder A1c 9.5 03/27/18 - CBG well controlled at this time - follow   Depression and anxiety Continue home med  Hx of DVT / Factor V Leiden mutation  coumadin per Pharmacy - is therapeutic   Hypotension w/ hx of HTN Holding usual BP med for now - keep hydrated - follow trend   HLD Cont usual home med tx  Hx of CVA  AKI prerenal secondary to dehydration and continuation of ACEI, diuretics, and bactrim - f/u in AM w/ volume expansion   DVT  prophylaxis: warfarin  Code Status: FULL CODE Family Communication: spoke w/ daughter at bedside   Disposition Plan:   Consultants:  None  Antimicrobials:  Rocephin 5/16 > Flagyl 5/16 > Vanc 5/16 >  Objective: Blood pressure 121/65, pulse 87, temperature 98.1 F (36.7 C), temperature source Rectal, resp. rate 13, height  (1.676 m), weight 63 kg (139 lb), SpO2 95 %.  Intake/Output Summary (Last 24 hours) at 05/02/2018 1333 Last data filed at 05/02/2018 0600 Gross per 24 hour  Intake 6585.42 ml  Output 700 ml  Net 5885.42 ml   Filed Weights   05/01/18 1656  Weight: 63 kg (139 lb)    Examination: General: No acute respiratory distress Lungs: Clear to auscultation bilaterally without wheezes or crackles Cardiovascular: Regular rate and rhythm without murmur gallop or rub normal S1 and S2 Abdomen: Nontender, nondistended, soft, bowel sounds positive, no rebound, no ascites, no appreciable mass Extremities: circumferential area of erythema R LE is now smaller than ink lines previously noting it's edge - 2 small areas in R groin c/w streaking erythema due to lymphatic spread from LE infection - no fluctuance or purulent d/c - no signif skin wounds    CBC: Recent Labs  Lab 04/30/18 0342 05/01/18 1705  WBC 11.5* 13.2*  NEUTROABS 8.6* 9.7*  HGB 14.7 12.9  HCT 45.2 40.2  MCV 89.9 90.3  PLT 221 195   Basic Metabolic Panel: Recent Labs  Lab 04/30/18 0342 05/01/18 1705  NA  133* 135  K 4.6 4.1  CL 92* 99*  CO2 30 22  GLUCOSE 310* 127*  BUN 25* 23*  CREATININE 1.03* 1.31*  CALCIUM 9.8 8.4*   GFR: Estimated Creatinine Clearance: 37.4 mL/min (A) (by C-G formula based on SCr of 1.31 mg/dL (H)).  Liver Function Tests: Recent Labs  Lab 05/01/18 1705  AST 49*  ALT 38  ALKPHOS 115  BILITOT 0.4  PROT 6.4*  ALBUMIN 3.2*   Recent Labs  Lab 05/02/18 0028  LIPASE 27    Coagulation Profile: Recent Labs  Lab 05/01/18 1705 05/02/18 0329  INR 3.14 3.53     HbA1C: Hemoglobin A1C  Date/Time Value Ref Range Status  03/27/2018 09:23 AM 9.5%  Final    CBG: Recent Labs  Lab 04/30/18 0327 04/30/18 0523 05/01/18 2258 05/02/18 0732 05/02/18 1305  GLUCAP 331* 196* 82 138* 149*    Recent Results (from the past 240 hour(s))  Culture, blood (Routine x 2)     Status: None (Preliminary result)   Collection Time: 05/01/18  5:05 PM  Result Value Ref Range Status   Specimen Description BLOOD LEFT HAND  Final   Special Requests   Final    BOTTLES DRAWN AEROBIC ONLY Blood Culture results may not be optimal due to an inadequate volume of blood received in culture bottles   Culture   Final    NO GROWTH < 24 HOURS Performed at [facility], [address], [place], [place] [address]    Report Status PENDING  Incomplete  Culture, blood (Routine x 2)     Status: None (Preliminary result)   Collection Time: 05/01/18  5:28 PM  Result Value Ref Range Status   Specimen Description BLOOD RIGHT ANTECUBITAL  Final   Special Requests   Final    BOTTLES DRAWN AEROBIC AND ANAEROBIC Blood Culture adequate volume   Culture   Final    NO GROWTH < 24 HOURS Performed at [facility], [address] [place], [place] [address]    Report Status PENDING  Incomplete  MRSA PCR Screening     Status: None   Collection Time: 05/01/18 10:34 PM  Result Value Ref Range Status   MRSA by PCR NEGATIVE NEGATIVE Final    Comment:        The GeneXpert MRSA Assay (FDA approved for NASAL specimens only), is one component of a comprehensive MRSA colonization surveillance program. It is not intended to diagnose MRSA infection nor to guide or monitor treatment for MRSA infections. Performed at [facility], [address] [place], [place] [address]      Scheduled Meds: . buPROPion  150 mg Oral Daily  . cholecalciferol  2,000 Units Oral Daily  . insulin aspart  0-5 Units Subcutaneous QHS  . insulin aspart  0-9 Units Subcutaneous TID WC  .  iopamidol      . mouth rinse  15 mL Mouth Rinse BID  . promethazine  12.5 mg Oral QHS  . simvastatin  40 mg Oral Daily  . Warfarin - Pharmacist Dosing Inpatient   Does not apply q1800     LOS: 1 day   Lonia Blood, MD [facility] Office  [phone] Pager - Text Page per Amion as per below:  On-Call/Text Page:      [email or web address]      password [email or web address]  If 7PM-7AM, please contact night-coverage [email or web address] Password [email or web address] 05/02/2018, 1:33 PM

## 2018-05-02 NOTE — Progress Notes (Signed)
Pt is NPO pending the results of her abdominal CT scan. If CT abdomen is negative, pt can have heart healthy/carb modified diet, per Dr. Stoney Bang, RN 05/02/18 12:43 AM

## 2018-05-03 LAB — GLUCOSE, CAPILLARY
GLUCOSE-CAPILLARY: 181 mg/dL — AB (ref 65–99)
Glucose-Capillary: 114 mg/dL — ABNORMAL HIGH (ref 65–99)
Glucose-Capillary: 195 mg/dL — ABNORMAL HIGH (ref 65–99)
Glucose-Capillary: 211 mg/dL — ABNORMAL HIGH (ref 65–99)

## 2018-05-03 LAB — PROTIME-INR
INR: 2.39
PROTHROMBIN TIME: 25.8 s — AB (ref 11.4–15.2)

## 2018-05-03 LAB — BASIC METABOLIC PANEL
Anion gap: 8 (ref 5–15)
BUN: 6 mg/dL (ref 6–20)
CALCIUM: 7.8 mg/dL — AB (ref 8.9–10.3)
CO2: 26 mmol/L (ref 22–32)
CREATININE: 0.8 mg/dL (ref 0.44–1.00)
Chloride: 105 mmol/L (ref 101–111)
GFR calc non Af Amer: >60 mL/min (ref >60)
Glucose, Bld: 148 mg/dL — ABNORMAL HIGH (ref 65–99)
Potassium: 3.6 mmol/L (ref 3.5–5.1)
SODIUM: 139 mmol/L (ref 135–145)

## 2018-05-03 LAB — CBC
HCT: 35 % — ABNORMAL LOW (ref 36.0–46.0)
Hemoglobin: 11.2 g/dL — ABNORMAL LOW (ref 12.0–15.0)
MCH: 29.1 pg (ref 26.0–34.0)
MCHC: 32 g/dL (ref 30.0–36.0)
MCV: 90.9 fL (ref 78.0–100.0)
PLATELETS: 201 10*3/uL (ref 150–400)
RBC: 3.85 MIL/uL — AB (ref 3.87–5.11)
RDW: 13.4 % (ref 11.5–15.5)
WBC: 12.5 10*3/uL — ABNORMAL HIGH (ref 4.0–10.5)

## 2018-05-03 MED ORDER — WARFARIN SODIUM 3 MG PO TABS
3.0000 mg | ORAL_TABLET | Freq: Once | ORAL | Status: AC
  Administered 2018-05-03: 3 mg via ORAL
  Filled 2018-05-03: qty 1

## 2018-05-03 NOTE — Progress Notes (Addendum)
PROGRESS NOTE    Adriana [name]  ZOX:[id number] DOB: [birth year] DOA: 05/01/2018 PCP: [name], MD   Brief Narrative: Patient is a 72 year old female with past medical history of hypertension, hyperlipidemia, diabetes mellitus, stroke, depression, OSA not on CPAP, factor V. Leiden mutation and DVT on Coumadin who presented to the emergency department with 2-day history of right leg pain, fever, chills, nausea, vomiting and abdominal pain.  She failed outpatient oral antibiotic  treatment as she was seen in the ED on the day prior to the admission and was started with Keflex and Bactrim. Patient also found to be hypotensive on presentation.  Her lactic acid level was elevated.  Currently she is on IV antibiotics and her cellulitis is improving.  Assessment & Plan:   Principal Problem:   Cellulitis of right leg Active Problems:   Poorly controlled type 2 diabetes mellitus with circulatory disorder (HCC)   Depression   DVT (deep venous thrombosis) (HCC)   Essential hypertension   Factor V Leiden mutation (HCC)   HLD (hyperlipidemia)   Poorly controlled type 2 diabetes mellitus (HCC)   Stroke (cerebrum) (HCC)   Sepsis (HCC)   Abdominal pain   AKI (acute kidney injury) (HCC)  Sepsis secondary to cellulitis of right leg: Continue broad-spectrum antibiotics.  On vancomycin, Rocephin and Flagyl IV.  We will change antibiotics to oral on discharge tomorrow.  Left upper extremity Doppler did not show DVT.  Abdominal pain: Resolved.  CT abdomen unremarkable.  Poorly controlled diabetes mellitus type 2: Hemoglobin A1c 9.5 as per 03/27/2018.  Depression/anxiety: Continue home meds.  Currently mood is stable  History of DVT/factor V Leiden mutation: On warfarin at home.  Continue warfarin here.  Continue daily INR monitoring.  History of hypertension: Hypotensive on presentation.  Blood pressure medications were held on admission.  IV fluids discontinued today.  Blood pressure  stable currently.    History of CVA: Continue  statin.    Hyperlipidemia: Continue statin.  Acute kidney injury: Resolved with IV fluids.  Patient reported difficulty in ambulation.  Will request for physical therapy evaluation today.  DVT prophylaxis: Coumadin Code Status: Full Family Communication: Family member present at the bedside Disposition Plan: Home tomorrow   Consultants: None Procedures:None  Antimicrobials: Vancomycin, ceftriaxone, Flagyl since 5/16.Day 3  Subjective: Patient seen and examined the bedside this morning.  Remains comfortable.  Complains of difficulty on bearing weight on her right lower extremity.  Areas of cellulitis on the right lower extremities getting better.  Objective: Vitals:   05/03/18 0500 05/03/18 0700 05/03/18 0800 05/03/18 0900  BP: (!) 121/59 (!) 120/54 (!) 152/73 122/70  Pulse: 84 83 (!) 101 (!) 112  Resp: Temp: 99.1 F (37.3 C)     TempSrc: Rectal     SpO2: 94% 97% 92% 93%  Weight:      Height:        Intake/Output Summary (Last 24 hours) at 05/03/2018 1042 Last data filed at 05/03/2018 0902 Gross per 24 hour  Intake 3825.83 ml  Output 2025 ml  Net 1800.83 ml   Filed Weights   05/01/18 1656  Weight: 63 kg (139 lb)    Examination:  General exam: Appears calm and comfortable ,Not in distress,average built HEENT:PERRL,Oral mucosa moist, Ear/Nose normal on gross exam Respiratory system: Bilateral equal air entry, normal vesicular breath sounds, no wheezes or crackles  Cardiovascular system: S1 & S2 heard, RRR. No JVD, murmurs, rubs, gallops or clicks. No pedal edema. Gastrointestinal system:  Abdomen is nondistended, soft and nontender. No organomegaly or masses felt. Normal bowel sounds heard. Central nervous system: Alert and oriented. No focal neurological deficits. Extremities: Areas of Erythema of the left lower extremity just above the ankle ,no edema, no clubbing ,no cyanosis, distal peripheral pulses  palpable. Skin: No rashes, lesions or ulcers,no icterus ,no pallor MSK: Normal muscle bulk,tone ,power Psychiatry: Judgement and insight appear normal. Mood & affect appropriate.     Data Reviewed: I have personally reviewed following labs and imaging studies  CBC: Recent Labs  Lab 04/30/18 0342 05/01/18 1705 05/03/18 0343  WBC 11.5* 13.2* 12.5*  NEUTROABS 8.6* 9.7*  --   HGB 14.7 12.9 11.2*  HCT 45.2 40.2 35.0*  MCV 89.9 90.3 90.9  PLT 221 195 201   Basic Metabolic Panel: Recent Labs  Lab 04/30/18 0342 05/01/18 1705 05/03/18 0343  NA 133* 135 139  K 4.6 4.1 3.6  CL 92* 99* 105  CO2 GLUCOSE 310* 127* 148*  BUN 25* 23* 6  CREATININE 1.03* 1.31* 0.80  CALCIUM 9.8 8.4* 7.8*   GFR: Estimated Creatinine Clearance: 61.3 mL/min (by C-G formula based on SCr of 0.8 mg/dL). Liver Function Tests: Recent Labs  Lab 05/01/18 1705  AST 49*  ALT 38  ALKPHOS 115  BILITOT 0.4  PROT 6.4*  ALBUMIN 3.2*   Recent Labs  Lab 05/02/18 0028  LIPASE 27   No results for input(s): AMMONIA in the last 168 hours. Coagulation Profile: Recent Labs  Lab 05/01/18 1705 05/02/18 0329 05/03/18 0343  INR 3.14 3.53 2.39   Cardiac Enzymes: No results for input(s): CKTOTAL, CKMB, CKMBINDEX, TROPONINI in the last 168 hours. BNP (last 3 results) No results for input(s): PROBNP in the last 8760 hours. HbA1C: No results for input(s): HGBA1C in the last 72 hours. CBG: Recent Labs  Lab 05/02/18 0732 05/02/18 1305 05/02/18 1730 05/02/18 2118 05/03/18 0721  GLUCAP 138* 149* 126* 171* 114*   Lipid Profile: No results for input(s): CHOL, HDL, LDLCALC, TRIG, CHOLHDL, LDLDIRECT in the last 72 hours. Thyroid Function Tests: No results for input(s): TSH, T4TOTAL, FREET4, T3FREE, THYROIDAB in the last 72 hours. Anemia Panel: No results for input(s): VITAMINB12, FOLATE, FERRITIN, TIBC, IRON, RETICCTPCT in the last 72 hours. Sepsis Labs: Recent Labs  Lab 05/01/18 1722  05/01/18 1911 05/02/18 0028  PROCALCITON  --   --  <0.10  LATICACIDVEN 2.65* 1.83 1.6    Recent Results (from the past 240 hour(s))  Culture, blood (Routine x 2)     Status: None (Preliminary result)   Collection Time: 05/01/18  5:05 PM  Result Value Ref Range Status   Specimen Description BLOOD LEFT HAND  Final   Special Requests   Final    BOTTLES DRAWN AEROBIC ONLY Blood Culture results may not be optimal due to an inadequate volume of blood received in culture bottles   Culture   Final    NO GROWTH < 24 HOURS Performed at [facility], [address], [place], [place] [address]    Report Status PENDING  Incomplete  Culture, blood (Routine x 2)     Status: None (Preliminary result)   Collection Time: 05/01/18  5:28 PM  Result Value Ref Range Status   Specimen Description BLOOD RIGHT ANTECUBITAL  Final   Special Requests   Final    BOTTLES DRAWN AEROBIC AND ANAEROBIC Blood Culture adequate volume   Culture   Final    NO GROWTH < 24 HOURS Performed  at [facility], [address], [place], [place] [address]    Report Status PENDING  Incomplete  MRSA PCR Screening     Status: None   Collection Time: 05/01/18 10:34 PM  Result Value Ref Range Status   MRSA by PCR NEGATIVE NEGATIVE Final    Comment:        The GeneXpert MRSA Assay (FDA approved for NASAL specimens only), is one component of a comprehensive MRSA colonization surveillance program. It is not intended to diagnose MRSA infection nor to guide or monitor treatment for MRSA infections. Performed at [facility], [address] [place], [place] [address]          Radiology Studies: Ct Abdomen Pelvis Wo Contrast  Result Date: 05/02/2018 CLINICAL DATA:  Abdominal pain with nausea and vomiting EXAM: CT ABDOMEN AND PELVIS WITHOUT CONTRAST TECHNIQUE: Multidetector CT imaging of the abdomen and pelvis was performed following the standard protocol without IV contrast. Oral contrast was  administered. COMPARISON:  None. FINDINGS: Lower chest: There is bibasilar atelectatic change. There are foci of coronary artery calcification evident. There is a small hiatal hernia. Hepatobiliary: No focal liver lesions are evident on this noncontrast enhanced study. Gallbladder is absent. There is no appreciable biliary duct dilatation. Pancreas: No pancreatic mass or inflammatory focus. Spleen: No splenic lesions are evident. Adrenals/Urinary Tract: Adrenals bilaterally appear unremarkable. Kidneys bilaterally show no appreciable mass or hydronephrosis on either side. There is mild perinephric stranding or along the lower pole left kidney with nearby slight fluid in the lateral conal fascia region. There is no renal or ureteral calculus on either side. Urinary bladder is midline with wall thickness within normal limits. Stomach/Bowel: There are multiple descending colonic and sigmoid diverticula without diverticulitis. Mild wall thickening in the mid sigmoid region is likely due to muscular hypertrophy from chronic diverticulosis. No evidence of bowel obstruction. No free air or portal venous air appreciable. Vascular/Lymphatic: There is atherosclerotic calcification in the aorta and common iliac arteries. There is no abdominal aortic aneurysm. Major mesenteric vessels appear patent on this noncontrast enhanced study. Reproductive: Uterus is absent.  No evident pelvic mass. Other: Appendix appears normal. There is no abscess or ascites in the abdomen or pelvis. Musculoskeletal: There are no blastic or lytic bone lesions. There is degenerative change in the lumbar spine. No intramuscular or abdominal wall lesions are evident. IMPRESSION: 1. There is slight stranding along the lower pole left kidney with a small amount of fluid in the nearby lateral conal fascia on the left. No renal mass or hydronephrosis. The possibility of a degree of pyelonephritis on the left must be of concern. No evidence of abscess.  Correlation with urinalysis advised. 2. No bowel obstruction. No abscess elsewhere in the abdomen or pelvis. Appendix appears normal. There is extensive left colonic diverticulosis without frank diverticulitis appreciable on this study. 3. Extensive aortoiliac atherosclerosis as well as coronary artery calcification. 4.  Small hiatal hernia. 5.  Gallbladder absent.  Uterus absent. Aortic Atherosclerosis (ICD10-I70.0). Electronically Signed   By: Bretta Bang III M.D.   On: 05/02/2018 07:29   Dg Chest Port 1 View  Result Date: 05/01/2018 CLINICAL DATA:  Fever, sepsis. EXAM: PORTABLE CHEST 1 VIEW COMPARISON:  None. FINDINGS: Cardiomediastinal silhouette is normal. Mediastinal contours appear intact. Calcific atherosclerotic disease of the aorta. There is no evidence of focal airspace consolidation, pleural effusion or pneumothorax. Osseous structures are without acute abnormality. Soft tissues are grossly normal. IMPRESSION: No active disease. Calcific atherosclerotic disease  of the aorta. Electronically Signed   By: Ted Mcalpine M.D.   On: 05/01/2018 18:25        Scheduled Meds: . buPROPion  150 mg Oral Daily  . cholecalciferol  2,000 Units Oral Daily  . gabapentin  100 mg Oral BID  . insulin aspart  0-5 Units Subcutaneous QHS  . insulin aspart  0-9 Units Subcutaneous TID WC  . mouth rinse  15 mL Mouth Rinse BID  . promethazine  12.5 mg Oral QHS  . simvastatin  40 mg Oral Daily  . Warfarin - Pharmacist Dosing Inpatient   Does not apply q1800   Continuous Infusions: . cefTRIAXone (ROCEPHIN)  IV Stopped (05/03/18 0330)  . metronidazole 500 mg (05/03/18 0902)  . vancomycin Stopped (05/02/18 1920)     LOS: 2 days    Time spent: 35 mins. More than 50% of that time was spent in counseling and/or coordination of care.      Burnadette Pop, MD [facility] Pager [phone]  If 7PM-7AM, please contact night-coverage [email or web address] Password [facility] 05/03/2018, 10:42 AM

## 2018-05-03 NOTE — Progress Notes (Signed)
ANTICOAGULATION CONSULT NOTE  Pharmacy Consult for Warfarin Indication: Factor V Leiden, hx DVT  Allergies  Allergen Reactions  . Anesthesia S-I-60 Nausea And Vomiting  . Aspirin Nausea And Vomiting   Patient Measurements: Height:  (167.6 cm) Weight: 139 lb (63 kg) IBW/kg (Calculated) : 59.3  Vital Signs: Temp: 99.1 F (37.3 C) (05/18 0500) Temp Source: Rectal (05/18 0500) BP: 151/72 (05/18 1000) Pulse Rate: 107 (05/18 1000)  Labs: Recent Labs    05/01/18 1705 05/02/18 0329 05/03/18 0343  HGB 12.9  --  11.2*  HCT 40.2  --  35.0*  PLT 195  --  201  LABPROT 32.0* 35.1* 25.8*  INR 3.14 3.53 2.39  CREATININE 1.31*  --  0.80   Estimated Creatinine Clearance: 61.3 mL/min (by C-G formula based on SCr of 0.8 mg/dL).  Assessment: 55 yoF on warfarin PTA for h/o Factor V Leiden and DVT. Pharmacy consulted to dose warfarin upon admission. Last dose taken 5/16 at 1500. INR supratherapeutic 3.14 on admit, now trending up to 3.53. CBC WNL, no bleeding documented. Noted, patient is on Flagyl and ceftriaxone - likely will cause INR to bump.  PTA warfarin regimen:  PO daily except 7.5mg  on Mon/Thurs per patient discussion with RN. Last dose 5/15 PTA.  INR is therapeutic today at 2.39, which has decreased after 2 held doses. No signs/symptoms of bleeding noted. AKI is resolving with fluids. Antibiotics continue.   Will give a decreased dose of warfarin tonight in setting of INR trending down (after being elevated) but potentially interacting antibiotics.   Goal of Therapy:  INR 2-3 Monitor platelets by anticoagulation protocol: Yes   Plan:  Warfarin  PO tonight x1 Monitor daily INR, CBC, s/sx bleeding F/U plan for antibiotics on discharge and potential DDIs  Carylon Perches, PharmD PGY2 Oncology Pharmacy Resident  Pharmacy Phone: [phone] 05/03/2018

## 2018-05-04 ENCOUNTER — Inpatient Hospital Stay (HOSPITAL_COMMUNITY): Payer: Medicare Other

## 2018-05-04 LAB — CBC WITH DIFFERENTIAL/PLATELET
Abs Immature Granulocytes: 0 10*3/uL (ref 0.0–0.1)
BASOS ABS: 0 10*3/uL (ref 0.0–0.1)
BASOS PCT: 0 %
EOS ABS: 0.1 10*3/uL (ref 0.0–0.7)
EOS PCT: 1 %
HEMATOCRIT: 37.4 % (ref 36.0–46.0)
Hemoglobin: 12.1 g/dL (ref 12.0–15.0)
Immature Granulocytes: 0 %
Lymphocytes Relative: 23 %
Lymphs Abs: 2.2 10*3/uL (ref 0.7–4.0)
MCH: 29.4 pg (ref 26.0–34.0)
MCHC: 32.4 g/dL (ref 30.0–36.0)
MCV: 90.8 fL (ref 78.0–100.0)
Monocytes Absolute: 0.9 10*3/uL (ref 0.1–1.0)
Monocytes Relative: 9 %
NEUTROS PCT: 67 %
Neutro Abs: 6.4 10*3/uL (ref 1.7–7.7)
PLATELETS: 235 10*3/uL (ref 150–400)
RBC: 4.12 MIL/uL (ref 3.87–5.11)
RDW: 13.3 % (ref 11.5–15.5)
WBC: 9.6 10*3/uL (ref 4.0–10.5)

## 2018-05-04 LAB — PROTIME-INR
INR: 1.82
PROTHROMBIN TIME: 20.9 s — AB (ref 11.4–15.2)

## 2018-05-04 LAB — GLUCOSE, CAPILLARY
GLUCOSE-CAPILLARY: 189 mg/dL — AB (ref 65–99)
GLUCOSE-CAPILLARY: 153 mg/dL — AB (ref 65–99)
GLUCOSE-CAPILLARY: 195 mg/dL — AB (ref 65–99)
Glucose-Capillary: 203 mg/dL — ABNORMAL HIGH (ref 65–99)

## 2018-05-04 LAB — CREATININE, SERUM
CREATININE: 0.86 mg/dL (ref 0.44–1.00)
GFR calc Af Amer: >60 mL/min (ref >60)

## 2018-05-04 MED ORDER — DOXYCYCLINE HYCLATE 100 MG PO TABS
100.0000 mg | ORAL_TABLET | Freq: Two times a day (BID) | ORAL | Status: DC
  Administered 2018-05-04 – 2018-05-05 (×3): 100 mg via ORAL
  Filled 2018-05-04 (×3): qty 1

## 2018-05-04 MED ORDER — WARFARIN SODIUM 7.5 MG PO TABS: 7.5000 mg | ORAL_TABLET | Freq: Once | ORAL | Status: DC

## 2018-05-04 MED ORDER — CEPHALEXIN 500 MG PO CAPS
500.0000 mg | ORAL_CAPSULE | Freq: Three times a day (TID) | ORAL | Status: DC
  Administered 2018-05-05 (×2): 500 mg via ORAL
  Filled 2018-05-04 (×2): qty 1

## 2018-05-04 MED ORDER — INSULIN ASPART 100 UNIT/ML ~~LOC~~ SOLN
0.0000 [IU] | Freq: Three times a day (TID) | SUBCUTANEOUS | Status: DC
  Administered 2018-05-04 (×2): 2 [IU] via SUBCUTANEOUS
  Administered 2018-05-04: 3 [IU] via SUBCUTANEOUS
  Administered 2018-05-05: 2 [IU] via SUBCUTANEOUS
  Administered 2018-05-05 (×2): 3 [IU] via SUBCUTANEOUS

## 2018-05-04 MED ORDER — WHITE PETROLATUM EX OINT
TOPICAL_OINTMENT | CUTANEOUS | Status: AC
  Administered 2018-05-04: 16:00:00
  Filled 2018-05-04: qty 28.35

## 2018-05-04 MED ORDER — GABAPENTIN 100 MG PO CAPS
100.0000 mg | ORAL_CAPSULE | Freq: Three times a day (TID) | ORAL | Status: DC
  Administered 2018-05-04 – 2018-05-05 (×4): 100 mg via ORAL
  Filled 2018-05-04 (×4): qty 1

## 2018-05-04 MED ORDER — WARFARIN SODIUM 7.5 MG PO TABS
7.5000 mg | ORAL_TABLET | Freq: Once | ORAL | Status: AC
  Administered 2018-05-04: 7.5 mg via ORAL
  Filled 2018-05-04: qty 1

## 2018-05-04 NOTE — Progress Notes (Signed)
ANTICOAGULATION CONSULT NOTE  Pharmacy Consult for Warfarin Indication: Factor V Leiden, hx DVT  Allergies  Allergen Reactions  . Anesthesia S-I-60 Nausea And Vomiting  . Aspirin Nausea And Vomiting   Patient Measurements: Height:  (167.6 cm) Weight: 139 lb (63 kg) IBW/kg (Calculated) : 59.3  Vital Signs: Temp: 100 F (37.8 C) (05/19 0855) Temp Source: Rectal (05/19 0855) BP: 142/82 (05/19 0725) Pulse Rate: 106 (05/19 0725)  Labs: Recent Labs    05/01/18 1705 05/02/18 0329 05/03/18 0343 05/04/18 0321  HGB 12.9  --  11.2* 12.1  HCT 40.2  --  35.0* 37.4  PLT 195  --  201 235  LABPROT 32.0* 35.1* 25.8* 20.9*  INR 3.14 3.53 2.39 1.82  CREATININE 1.31*  --  0.80 0.86   Estimated Creatinine Clearance: 57 mL/min (by C-G formula based on SCr of 0.86 mg/dL).  Assessment: 89 yoF on warfarin PTA for h/o Factor V Leiden and DVT. Pharmacy consulted to dose warfarin upon admission. Last dose taken 5/16 at 1500. INR supratherapeutic 3.14 on admit, now trending up to 3.53. CBC WNL, no bleeding documented. Noted, patient is on Flagyl and ceftriaxone - likely will cause INR to bump.  PTA warfarin regimen:  PO daily except 7.5mg  on Mon/Thurs per patient discussion with RN. Last dose 5/15 PTA.  INR is subtherapeutic at 1.82 today, which has decreased after 2 held doses and a small dose decrease last night. No signs/symptoms of bleeding noted. AKI is resolving with fluids.  Spoke with MD about home antibiotic plan - she will be taking doxycycline and cephalexin for 6 more days after discharge today. These antibiotics usually do not have a large impact on INR, but do have potential to increase it. Close outpatient monitoring will be important.   Will give boost dose today since INR is trending down after held doses.   Goal of Therapy:  INR 2-3 Monitor platelets by anticoagulation protocol: Yes   Plan:  Warfarin 7.5mg  PO once today before discharge Resume home dose of warfarin  tomorrow at home (elevated INR on admission was likely 2/2 bactrim drug interaction).  Have INR checked early next week - around Tuesday if possible.   Carylon Perches, PharmD PGY2 Oncology Pharmacy Resident  Pharmacy Phone: [phone] 05/04/2018

## 2018-05-04 NOTE — Progress Notes (Addendum)
PROGRESS NOTE    Adriana [name]  ZOX:[id number] DOB: [birth year] DOA: 05/01/2018 PCP: [name], MD   Brief Narrative: Patient is a 72 year old female with past medical history of hypertension, hyperlipidemia, diabetes mellitus, stroke, depression, OSA not on CPAP, factor V. Leiden mutation and DVT on Coumadin who presented to the emergency department with 2-day history of right leg pain, fever, chills, nausea, vomiting and abdominal pain.  She failed outpatient oral antibiotic  treatment as she was seen in the ED on the day prior to the admission and was started with Keflex and Bactrim. Patient also found to be hypotensive on presentation.  Her lactic acid level was elevated.  She was started on  IV antibiotics and her cellulitis is improving.  Physical therapy recommended home health. Discharge was planned but patient continues to complain of persistent right leg pain and unable to bear weight.  Xrays have been ordered.  Assessment & Plan:   Principal Problem:   Cellulitis of right leg Active Problems:   Poorly controlled type 2 diabetes mellitus with circulatory disorder (HCC)   Depression   DVT (deep venous thrombosis) (HCC)   Essential hypertension   Factor V Leiden mutation (HCC)   HLD (hyperlipidemia)   Poorly controlled type 2 diabetes mellitus (HCC)   Stroke (cerebrum) (HCC)   Sepsis (HCC)   Abdominal pain   AKI (acute kidney injury) (HCC)  Sepsis secondary to cellulitis of right leg: Was started on vancomycin, Rocephin and Flagyl IV.  We will change antibiotics to oral today.  Left upper extremity Doppler done as an outpatient did not show DVT. Cellulitic area has significantly improved with IV antibiotics.  Minimal erythema, minimal edema.   Right leg pain: Consistently complains that her pain on the right leg has not improved .There is significant improvement in the cellulitis.She also has mild edema around right ankle and has significant tenderness.  Will  order the x-rays.  Since CT scan also showed extensive aortoiliac atherosclerosis and she has history of diabetes, will do arterial Doppler/ABI to rule out any peripheral vascular disease. PT recommended home health on discharge. Right leg pain could also be from diabetic neuropathy .We will start on low-dose gabapentin. If any abnormalities seen on Xray,we will consider Ortho consult.  Abdominal pain: Resolved.  CT abdomen unremarkable.  Poorly controlled diabetes mellitus type 2: Hemoglobin A1c 9.5 as per 03/27/2018.  Depression/anxiety: Continue home meds.  Currently mood is stable  History of DVT/factor V Leiden mutation: On warfarin at home.  Continue warfarin here.  Continue daily INR monitoring.  History of hypertension: Hypotensive on presentation.  Blood pressure medications were held on admission.  IV fluids discontinued .  Blood pressure stable currently.    History of CVA: Continue  statin.    Hyperlipidemia: Continue statin.  Acute kidney injury: Resolved with IV fluids.  DVT prophylaxis: Coumadin Code Status: Full Family Communication: Family members present at the bedside Disposition Plan: Home likely after report of imaging studies becomes available t   Consultants: None Procedures:None  Antimicrobials: Vancomycin, ceftriaxone, Flagyl from  5/16-5/19 On Keflex and doxycycline since 05/04/2018  Subjective: Patient seen and examined the bedside this morning.  Remains comfortable.  Complains of difficulty on bearing weight on her right lower extremity and pain.  Areas of cellulitis on the right lower extremities much better. Objective: Vitals:   05/04/18 0300 05/04/18 0400 05/04/18 0725 05/04/18 0855  BP:   (!) 142/82   Pulse:   (!) 106   Resp: 19 19 16  Temp:    100 F (37.8 C)  TempSrc:    Rectal  SpO2:   90%   Weight:      Height:        Intake/Output Summary (Last 24 hours) at 05/04/2018 1228 Last data filed at 05/04/2018 1610 Gross per 24 hour    Intake 1100 ml  Output 1775 ml  Net -675 ml   Filed Weights   05/01/18 1656  Weight: 63 kg (139 lb)    Examination:  General exam: Appears calm and comfortable ,Not in distress,average built HEENT:PERRL,Oral mucosa moist, Ear/Nose normal on gross exam Respiratory system: Bilateral equal air entry, normal vesicular breath sounds, no wheezes or crackles  Cardiovascular system: S1 & S2 heard, RRR. No JVD, murmurs, rubs, gallops or clicks. No pedal edema. Gastrointestinal system: Abdomen is nondistended, soft and nontender. No organomegaly or masses felt. Normal bowel sounds heard. Central nervous system: Alert and oriented. No focal neurological deficits. Extremities: Improving Areas of Erythema of the left lower extremity just above the ankle ,no edema, no clubbing ,no cyanosis, distal peripheral pulses palpable. Skin: No rashes, lesions or ulcers,no icterus ,no pallor MSK: Normal muscle bulk,tone ,power Psychiatry: Judgement and insight appear normal. Mood & affect appropriate.     Data Reviewed: I have personally reviewed following labs and imaging studies  CBC: Recent Labs  Lab 04/30/18 0342 05/01/18 1705 05/03/18 0343 05/04/18 0321  WBC 11.5* 13.2* 12.5* 9.6  NEUTROABS 8.6* 9.7*  --  6.4  HGB 14.7 12.9 11.2* 12.1  HCT 45.2 40.2 35.0* 37.4  MCV 89.9 90.3 90.9 90.8  PLT 221 195 201 235   Basic Metabolic Panel: Recent Labs  Lab 04/30/18 0342 05/01/18 1705 05/03/18 0343 05/04/18 0321  NA 133* 135 139  --   K 4.6 4.1 3.6  --   CL 92* 99* 105  --   CO2 --   GLUCOSE 310* 127* 148*  --   BUN 25* 23* 6  --   CREATININE 1.03* 1.31* 0.80 0.86  CALCIUM 9.8 8.4* 7.8*  --    GFR: Estimated Creatinine Clearance: 57 mL/min (by C-G formula based on SCr of 0.86 mg/dL). Liver Function Tests: Recent Labs  Lab 05/01/18 1705  AST 49*  ALT 38  ALKPHOS 115  BILITOT 0.4  PROT 6.4*  ALBUMIN 3.2*   Recent Labs  Lab 05/02/18 0028  LIPASE 27   No results for  input(s): AMMONIA in the last 168 hours. Coagulation Profile: Recent Labs  Lab 05/01/18 1705 05/02/18 0329 05/03/18 0343 05/04/18 0321  INR 3.14 3.53 2.39 1.82   Cardiac Enzymes: No results for input(s): CKTOTAL, CKMB, CKMBINDEX, TROPONINI in the last 168 hours. BNP (last 3 results) No results for input(s): PROBNP in the last 8760 hours. HbA1C: No results for input(s): HGBA1C in the last 72 hours. CBG: Recent Labs  Lab 05/03/18 1217 05/03/18 1747 05/03/18 2115 05/04/18 0722 05/04/18 1212  GLUCAP 195* 181* 211* 189* 153*   Lipid Profile: No results for input(s): CHOL, HDL, LDLCALC, TRIG, CHOLHDL, LDLDIRECT in the last 72 hours. Thyroid Function Tests: No results for input(s): TSH, T4TOTAL, FREET4, T3FREE, THYROIDAB in the last 72 hours. Anemia Panel: No results for input(s): VITAMINB12, FOLATE, FERRITIN, TIBC, IRON, RETICCTPCT in the last 72 hours. Sepsis Labs: Recent Labs  Lab 05/01/18 1722 05/01/18 1911 05/02/18 0028  PROCALCITON  --   --  <0.10  LATICACIDVEN 2.65* 1.83 1.6    Recent Results (from the past 240 hour(s))  Culture,  blood (Routine x 2)     Status: None (Preliminary result)   Collection Time: 05/01/18  5:05 PM  Result Value Ref Range Status   Specimen Description BLOOD LEFT HAND  Final   Special Requests   Final    BOTTLES DRAWN AEROBIC ONLY Blood Culture results may not be optimal due to an inadequate volume of blood received in culture bottles   Culture   Final    NO GROWTH 3 DAYS Performed at [facility], [address], [place], [place] [address]    Report Status PENDING  Incomplete  Culture, blood (Routine x 2)     Status: None (Preliminary result)   Collection Time: 05/01/18  5:28 PM  Result Value Ref Range Status   Specimen Description BLOOD RIGHT ANTECUBITAL  Final   Special Requests   Final    BOTTLES DRAWN AEROBIC AND ANAEROBIC Blood Culture adequate volume   Culture   Final    NO GROWTH 3 DAYS Performed at [facility], [address] [place], [place] [address]    Report Status PENDING  Incomplete  MRSA PCR Screening     Status: None   Collection Time: 05/01/18 10:34 PM  Result Value Ref Range Status   MRSA by PCR NEGATIVE NEGATIVE Final    Comment:        The GeneXpert MRSA Assay (FDA approved for NASAL specimens only), is one component of a comprehensive MRSA colonization surveillance program. It is not intended to diagnose MRSA infection nor to guide or monitor treatment for MRSA infections. Performed at [facility], [address], [place], [place] [address]          Radiology Studies: No results found.      Scheduled Meds: . buPROPion  150 mg Oral Daily  . [START ON 05/05/2018] cephALEXin  500 mg Oral Q8H  . cholecalciferol  2,000 Units Oral Daily  . doxycycline  100 mg Oral Q12H  . gabapentin  100 mg Oral BID  . insulin aspart  0-5 Units Subcutaneous QHS  . insulin aspart  0-9 Units Subcutaneous TID WC  . mouth rinse  15 mL Mouth Rinse BID  . promethazine  12.5 mg Oral QHS  . simvastatin  40 mg Oral Daily  . warfarin  7.5 mg Oral ONCE-1800  . Warfarin - Pharmacist Dosing Inpatient   Does not apply q1800   Continuous Infusions:    LOS: 3 days    Time spent: 35 mins. More than 50% of that time was spent in counseling and/or coordination of care.      Burnadette Pop, MD [facility] Pager [phone]  If 7PM-7AM, please contact night-coverage [email or web address] Password [email or web address] 05/04/2018, 12:28 PM

## 2018-05-04 NOTE — Care Management Note (Signed)
Case Management Note  Patient Details  Name: Hollee Fate MRN: [id number] Date of Birth: [birth year]  Subjective/Objective:       Pt presents for RLE cellulitis, sepsis, DM.    Pt from home with husband and has 2 adult daughters to assist.  Pt has W/C, walker, 3n1 and other DME at home from previous surgeries.  Pt was independent PTA.          Action/Plan: Pt and family choose [facility] for [facility] PT.  Will need HH order/F2F.  Pt anticipates d/c tomorrow. Jermaine with [facility] accepted referral and will follow for [facility] order.   Expected Discharge Date:                  Expected Discharge Plan:  Home w Home Health Services  In-House Referral:  NA  Discharge planning Services  CM Consult  Post Acute Care Choice:  Home Health Choice offered to:  Patient  DME Arranged:  N/A DME Agency:  NA  HH Arranged:  PT HH Agency:  [facility]  Status of Service:  Completed, signed off  If discussed at Long Length of Stay Meetings, dates discussed:    Additional Comments:  Deveron Furlong, RN 05/04/2018, 1:57 PM

## 2018-05-04 NOTE — Discharge Instructions (Signed)

## 2018-05-04 NOTE — Plan of Care (Signed)
Discussed with patient plan of care for the evening, pain management and bedtime medications with some teach back displayed 

## 2018-05-04 NOTE — Evaluation (Signed)
Physical Therapy Evaluation Patient Details Name: Adriana [name] MRN: [id number] DOB: 1946/01/09 Today's Date: 05/04/2018   History of Present Illness   72 y.o. female with PMHx: HTN, HLD, DM,CVA, depression, OSA not on CPAP. Admitted with sepsis and RLE cellulitis  Clinical Impression  Pt pleasant in bed on arrival with leg with marked redness well within previously marked borders. However, once RLE dependent rubor noted with increased edema and pain limiting pt's tolerance for standing and walking with inability to bear weight. Pt with decreased transfers, gait , function and pain who will benefit from acute therapy to maximize function and independence. Pt also noted to be dizzy and nauseous with initial gait. Pt with all necessary DME for functioning with NWB RLE but pt eager to have RLE better to return to walking. Will follow acutely.      Follow Up Recommendations Home health PT    Equipment Recommendations  None recommended by PT    Recommendations for Other Services       Precautions / Restrictions Precautions Precautions: None      Mobility  Bed Mobility Overal bed mobility: Modified Independent                Transfers Overall transfer level: Needs assistance   Transfers: Sit to/from Stand;Stand Pivot Transfers Sit to Stand: Supervision Stand pivot transfers: Supervision       General transfer comment: cues for hand placement, sequence and safety with transfers. Pt able to pivot from bed to [facility] with RW maintaining RLE NWB status due to pain  Ambulation/Gait Ambulation/Gait assistance: Min guard Ambulation Distance (Feet): 12 Feet Assistive device: Rolling walker (2 wheeled) Gait Pattern/deviations: Step-to pattern   Gait velocity interpretation: <1.8 ft/sec, indicate of risk for recurrent falls General Gait Details: pt unable to tolerate weight on RLE stating numb and painful. pt hopped 12' to door then became dizzy and nauseated with chair  pulled to her. Pt then able to stand and return to chair. Cues for position in RW  Stairs            Wheelchair Mobility    Modified Rankin (Stroke Patients Only)       Balance Overall balance assessment: Mild deficits observed, not formally tested                                           Pertinent Vitals/Pain Pain Assessment: 0-10 Pain Score: 8  Pain Location: RLE dependent Pain Descriptors / Indicators: Throbbing Pain Intervention(s): Limited activity within patient's tolerance;Monitored during session;Repositioned    Home Living Family/patient expects to be discharged to:: Private residence Living Arrangements: Spouse/significant other Available Help at Discharge: Family;Available 24 hours/day Type of Home: House Home Access: Level entry     Home Layout: One level Home Equipment: Walker - 2 wheels;Bedside commode;Wheelchair - manual      Prior Function Level of Independence: Independent         Comments: likes to shop for 10 grandkids     Hand Dominance        Extremity/Trunk Assessment   Upper Extremity Assessment Upper Extremity Assessment: Overall WFL for tasks assessed    Lower Extremity Assessment Lower Extremity Assessment: RLE deficits/detail RLE Deficits / Details: decreased function due to pain    Cervical / Trunk Assessment Cervical / Trunk Assessment: Normal  Communication   Communication: No difficulties  Cognition Arousal/Alertness: Awake/alert Behavior  During Therapy: WFL for tasks assessed/performed Overall Cognitive Status: Within Functional Limits for tasks assessed                                        General Comments      Exercises     Assessment/Plan    PT Assessment Patient needs continued PT services  PT Problem List Decreased balance;Decreased activity tolerance;Impaired sensation;Decreased knowledge of use of DME       PT Treatment Interventions Gait  training;Therapeutic activities;Therapeutic exercise;DME instruction;Functional mobility training;Balance training;Patient/family education    PT Goals (Current goals can be found in the Care Plan section)  Acute Rehab PT Goals Patient Stated Goal: return to shopping PT Goal Formulation: With patient/family Time For Goal Achievement: 05/18/18 Potential to Achieve Goals: Good    Frequency Min 3X/week   Barriers to discharge        Co-evaluation               AM-PAC PT "6 Clicks" Daily Activity  Outcome Measure Difficulty turning over in bed (including adjusting bedclothes, sheets and blankets)?: None Difficulty moving from lying on back to sitting on the side of the bed? : None Difficulty sitting down on and standing up from a chair with arms (e.g., wheelchair, bedside commode, etc,.)?: A Little Help needed moving to and from a bed to chair (including a wheelchair)?: A Little Help needed walking in hospital room?: A Little Help needed climbing 3-5 steps with a railing? : A Lot 6 Click Score: 19    End of Session   Activity Tolerance: Patient limited by pain Patient left: in chair;with call bell/phone within reach;with family/visitor present Nurse Communication: Mobility status PT Visit Diagnosis: Other abnormalities of gait and mobility (R26.89);Pain Pain - Right/Left: Right Pain - part of body: Leg    Time: 1129-1200 PT Time Calculation (min) (ACUTE ONLY): 31 min   Charges:   PT Evaluation $PT Eval Moderate Complexity: 1 Mod PT Treatments $Therapeutic Activity: 8-22 mins   PT G Codes:        Adriana [name], PT [phone]   Adriana [name] 05/04/2018, 12:12 PM

## 2018-05-05 ENCOUNTER — Inpatient Hospital Stay (HOSPITAL_COMMUNITY): Payer: Medicare Other

## 2018-05-05 DIAGNOSIS — I739 Peripheral vascular disease, unspecified: Secondary | ICD-10-CM

## 2018-05-05 DIAGNOSIS — L03115 Cellulitis of right lower limb: Secondary | ICD-10-CM

## 2018-05-05 LAB — LIPID PANEL
Cholesterol: 83 mg/dL (ref 0–200)
HDL: 31 mg/dL — ABNORMAL LOW (ref >40)
LDL CALC: 33 mg/dL (ref 0–99)
Total CHOL/HDL Ratio: 2.7 RATIO
Triglycerides: 93 mg/dL (ref <150)
VLDL: 19 mg/dL (ref 0–40)

## 2018-05-05 LAB — GLUCOSE, CAPILLARY
GLUCOSE-CAPILLARY: 237 mg/dL — AB (ref 65–99)
Glucose-Capillary: 152 mg/dL — ABNORMAL HIGH (ref 65–99)
Glucose-Capillary: 202 mg/dL — ABNORMAL HIGH (ref 65–99)

## 2018-05-05 LAB — PROTIME-INR
INR: 1.98
PROTHROMBIN TIME: 22.3 s — AB (ref 11.4–15.2)

## 2018-05-05 MED ORDER — IBUPROFEN 200 MG PO TABS: 200.0000 mg | ORAL_TABLET | Freq: Three times a day (TID) | ORAL | 0 refills | Status: AC

## 2018-05-05 MED ORDER — CEPHALEXIN 500 MG PO CAPS: 500.0000 mg | ORAL_CAPSULE | Freq: Four times a day (QID) | ORAL | 0 refills | Status: AC

## 2018-05-05 MED ORDER — IBUPROFEN 200 MG PO TABS: 200.0000 mg | ORAL_TABLET | Freq: Three times a day (TID) | ORAL | 0 refills | Status: DC

## 2018-05-05 MED ORDER — ACETAMINOPHEN 500 MG PO TABS: 500.0000 mg | ORAL_TABLET | Freq: Four times a day (QID) | ORAL | 0 refills | Status: DC | PRN

## 2018-05-05 MED ORDER — TRAMADOL HCL 50 MG PO TABS: 50.0000 mg | ORAL_TABLET | Freq: Four times a day (QID) | ORAL | 0 refills | Status: DC | PRN

## 2018-05-05 MED ORDER — PANTOPRAZOLE SODIUM 40 MG PO TBEC: 40.0000 mg | DELAYED_RELEASE_TABLET | Freq: Every day | ORAL | 0 refills | Status: DC

## 2018-05-05 MED ORDER — WARFARIN SODIUM 5 MG PO TABS
5.0000 mg | ORAL_TABLET | Freq: Once | ORAL | Status: AC
  Administered 2018-05-05: 5 mg via ORAL
  Filled 2018-05-05: qty 1

## 2018-05-05 MED ORDER — IBUPROFEN 200 MG PO TABS
200.0000 mg | ORAL_TABLET | Freq: Three times a day (TID) | ORAL | Status: DC
  Administered 2018-05-05: 200 mg via ORAL
  Filled 2018-05-05: qty 1

## 2018-05-05 MED ORDER — PANTOPRAZOLE SODIUM 40 MG PO TBEC
40.0000 mg | DELAYED_RELEASE_TABLET | Freq: Every day | ORAL | Status: DC
  Administered 2018-05-05: 40 mg via ORAL
  Filled 2018-05-05: qty 1

## 2018-05-05 NOTE — Discharge Summary (Addendum)
Physician Discharge Summary  Adriana [name] ZOX:[id number] DOB: 10-31-1946 DOA: 05/01/2018  PCP: [name], MD  Admit date: 05/01/2018 Discharge date: 05/05/2018  Admitted From: home Disposition:  home  Recommendations for Outpatient Follow-up and new medication changes:  1. Follow up with PCP in 1- week 2. Patient has advised to keep her right leg elevated 3. Physical therapy and home health have been arranged 4. Patient will take 7 more days of Cephalexin 500 mg qid 5. Pain control with Ultram and acetaminophen as needed 6. Placed on 10 day course of pantoprazole.  7. Patient placed on ibuprofen 200 mg tid for 7 days.   Home Health: yes  Equipment/Devices: no    Discharge Condition: stable  CODE STATUS: full  Diet recommendation: Heart healthy   Brief/Interim Summary: 72 year old female who presents with right leg pain, fever, nausea and abdominal pain. She does have the significant past medical history for hypertension, dyslipidemia, type 2 diabetes mellitus, history of CVA, depression, obstructive sleep apnea and factor V Leiden mutation. Patient reported 2 day history of worsening right leg edema and erythema, associated with severe sharp pain. She was treated as an outpatient with antibiotic therapy with no significant improvement. Her initial blood pressure was 73/47 which improved after IV fluids to 107/54, heart rate 89, respiratory rate 19, oxygen saturation 91%, dry mucous membranes, lungs clear to auscultation bilaterally, heart S1-S2 present rhythmic, no gallops, rubs or murmurs, abdomen was soft, nondistended, but tender in the upper quadrant, right leg with edema, erythema, increased local temperature and significant tenderness. Sodium 135, potassium 4.1, chloride 99, bicarbonate 22, glucose 127, BUN 23, creatinine 1.31, venous lactic acid 2.6, white count 13.2, hemoglobin 12.9, hematocrit 41, platelets 195, urinalysis negative for infection. CT of the abdomen with  slight stranding along the lower pole left kidney. Chest x-ray negative for infiltrates. EKG sinus rhythm, normal intervals.  Patient was admitted to hospital with the working diagnosis of sepsis due to right lower extremity cellulitis complicated by acute kidney injury.   1.  Sepsis due to right lower extremity cellulitis.  Patient was admitted to the stepdown unit, she was placed on IV broad-spectrum therapy with vancomycin, ceftriaxone and metronidazole, with significant improvement of her symptoms.  She received IV fluids and remained hemodynamically stable.  Her blood cultures resulted with no growth, her discharge white cell count is 9.6.  She has been afebrile.  She continues to have pain on her right lower extremity but has improved in intensity.  Patient will continue on her antibiotic therapy with cephalexin for the next 7 days with a follow-up next week at the outpatient clinic.   2.  Right leg phlebitis venous insufficiency.  She does have symptoms of right leg venous insufficiency with erythema and pain, when her leg is exposed to gravity.  She was advised to keep her leg elevated, continue pain control with analgesics, and eventually use compression stocking.  Home health and home physical therapy have been arranged.  Patient underwent arerial Doppler ultrasonography before discharge. Will continue 200 mg ibuprofen tid for 7 days.   3.  Medication induced gastritis.  Abdominal pain has been improving, patient has been placed on antiacids, that will continue for the next 10 days.  CT of the abdomen was negative for acute changes.  Patient will continue taking pantoprazole for the next 10 days.  4.  History of DVT, factor V Leiden mutation.  Continue anticoagulation with warfarin, her INR was closely monitored during her hospitalization, her discharge INR  is 1.98.   5.  Hypertension.  Antihypertensive agents were held during her hospitalization to prevent hypotension, at discharge she will  resume lisinopril 5 mg daily.  6.  Type 2 diabetes mellitus.  Patient was placed on insulin sliding scale for glucose coverage and monitoring, at discharge will resume taking metformin 1000 mg twice daily.  Her capillary glucose remained well controlled.  7.  Acute kidney injury.  Likely related to sepsis, responded well to IV fluids, discharge creatinine 0.86.  8.  History of CVA/ and dyslipidemia.  Remained stable, continue statin therapy, blood pressure control.   Discharge Diagnoses:  Principal Problem:   Cellulitis of right leg Active Problems:   Poorly controlled type 2 diabetes mellitus with circulatory disorder (HCC)   Depression   DVT (deep venous thrombosis) (HCC)   Essential hypertension   Factor V Leiden mutation (HCC)   HLD (hyperlipidemia)   Poorly controlled type 2 diabetes mellitus (HCC)   Stroke (cerebrum) (HCC)   Sepsis (HCC)   Abdominal pain   AKI (acute kidney injury) (HCC)    Discharge Instructions   Allergies as of 05/05/2018      Reactions   Anesthesia S-i-60 Nausea And Vomiting   Aspirin Nausea And Vomiting      Medication List    STOP taking these medications   HYDROcodone-acetaminophen 5-325 MG tablet Commonly known as:  NORCO/VICODIN   predniSONE 20 MG tablet Commonly known as:  DELTASONE   sulfamethoxazole-trimethoprim 800-160 MG tablet Commonly known as:  BACTRIM DS,SEPTRA DS     TAKE these medications   acetaminophen 500 MG tablet Commonly known as:  TYLENOL Take 1 tablet (500 mg total) by mouth every 6 (six) hours as needed for moderate pain (or Fever >/= 101).   buPROPion 150 MG 24 hr tablet Commonly known as:  WELLBUTRIN XL Take 150 mg by mouth daily.   cephALEXin 500 MG capsule Commonly known as:  KEFLEX Take 1 capsule (500 mg total) by mouth 4 (four) times daily for 10 days.   chlorthalidone 25 MG tablet Commonly known as:  HYGROTON Take 25 mg by mouth daily.   clobetasol cream 0.05 % Commonly known as:   TEMOVATE Apply 1 application topically as needed.   FREESTYLE LIBRE 14 DAY SENSOR Misc 1 each by Does not apply route every 14 (fourteen) days. Change every 2 weeks   lisinopril 5 MG tablet Commonly known as:  PRINIVIL,ZESTRIL Take 5 mg by mouth daily.   metFORMIN 1000 MG tablet Commonly known as:  GLUCOPHAGE Take 1,000 mg by mouth 2 (two) times daily with a meal.   ondansetron 4 MG disintegrating tablet Commonly known as:  ZOFRAN ODT Take 1 tablet (4 mg total) by mouth every 6 (six) hours as needed.   pantoprazole 40 MG tablet Commonly known as:  PROTONIX Take 1 tablet (40 mg total) by mouth daily for 10 days.   potassium citrate 10 MEQ (1080 MG) SR tablet Commonly known as:  UROCIT-K Take 10 mEq by mouth 3 (three) times daily with meals.   promethazine 12.5 MG tablet Commonly known as:  PHENERGAN Take 12.5 mg by mouth at bedtime.   simvastatin 40 MG tablet Commonly known as:  ZOCOR Take 40 mg by mouth daily.   traMADol 50 MG tablet Commonly known as:  ULTRAM Take 1 tablet (50 mg total) by mouth every 6 (six) hours as needed for severe pain.   Vitamin D 2000 units Caps Take 2,000 Units by mouth daily.   VOLTAREN 1 %  Gel Generic drug:  diclofenac sodium Apply topically as needed.   warfarin 5 MG tablet Commonly known as:  COUMADIN Take 5 mg by mouth daily.      Follow-up Information    [facility].   Specialty:  Home Health Services Why:  Physical therapist will call you to set up appointment.  HHPT Contact information: [address] [place] [address] [phone]          Allergies  Allergen Reactions  . Anesthesia S-I-60 Nausea And Vomiting  . Aspirin Nausea And Vomiting    Consultations:     Procedures/Studies: Ct Abdomen Pelvis Wo Contrast  Result Date: 05/02/2018 CLINICAL DATA:  Abdominal pain with nausea and vomiting EXAM: CT ABDOMEN AND PELVIS WITHOUT CONTRAST TECHNIQUE: Multidetector CT  imaging of the abdomen and pelvis was performed following the standard protocol without IV contrast. Oral contrast was administered. COMPARISON:  None. FINDINGS: Lower chest: There is bibasilar atelectatic change. There are foci of coronary artery calcification evident. There is a small hiatal hernia. Hepatobiliary: No focal liver lesions are evident on this noncontrast enhanced study. Gallbladder is absent. There is no appreciable biliary duct dilatation. Pancreas: No pancreatic mass or inflammatory focus. Spleen: No splenic lesions are evident. Adrenals/Urinary Tract: Adrenals bilaterally appear unremarkable. Kidneys bilaterally show no appreciable mass or hydronephrosis on either side. There is mild perinephric stranding or along the lower pole left kidney with nearby slight fluid in the lateral conal fascia region. There is no renal or ureteral calculus on either side. Urinary bladder is midline with wall thickness within normal limits. Stomach/Bowel: There are multiple descending colonic and sigmoid diverticula without diverticulitis. Mild wall thickening in the mid sigmoid region is likely due to muscular hypertrophy from chronic diverticulosis. No evidence of bowel obstruction. No free air or portal venous air appreciable. Vascular/Lymphatic: There is atherosclerotic calcification in the aorta and common iliac arteries. There is no abdominal aortic aneurysm. Major mesenteric vessels appear patent on this noncontrast enhanced study. Reproductive: Uterus is absent.  No evident pelvic mass. Other: Appendix appears normal. There is no abscess or ascites in the abdomen or pelvis. Musculoskeletal: There are no blastic or lytic bone lesions. There is degenerative change in the lumbar spine. No intramuscular or abdominal wall lesions are evident. IMPRESSION: 1. There is slight stranding along the lower pole left kidney with a small amount of fluid in the nearby lateral conal fascia on the left. No renal mass or  hydronephrosis. The possibility of a degree of pyelonephritis on the left must be of concern. No evidence of abscess. Correlation with urinalysis advised. 2. No bowel obstruction. No abscess elsewhere in the abdomen or pelvis. Appendix appears normal. There is extensive left colonic diverticulosis without frank diverticulitis appreciable on this study. 3. Extensive aortoiliac atherosclerosis as well as coronary artery calcification. 4.  Small hiatal hernia. 5.  Gallbladder absent.  Uterus absent. Aortic Atherosclerosis (ICD10-I70.0). Electronically Signed   By: Bretta Bang III M.D.   On: 05/02/2018 07:29   Dg Ankle Complete Right  Result Date: 05/04/2018 CLINICAL DATA:  Right ankle pain EXAM: RIGHT ANKLE - COMPLETE 3+ VIEW COMPARISON:  None. FINDINGS: Negative for fracture. Ankle joint normal. Mild calcaneal spurring. Mild degenerative change in the talonavicular joint. IMPRESSION: No acute abnormality. Mild degenerative change in the talonavicular joint. Electronically Signed   By: Marlan Palau M.D.   On: 05/04/2018 16:17   Dg Chest Port 1 View  Result Date: 05/01/2018 CLINICAL DATA:  Fever, sepsis.  EXAM: PORTABLE CHEST 1 VIEW COMPARISON:  None. FINDINGS: Cardiomediastinal silhouette is normal. Mediastinal contours appear intact. Calcific atherosclerotic disease of the aorta. There is no evidence of focal airspace consolidation, pleural effusion or pneumothorax. Osseous structures are without acute abnormality. Soft tissues are grossly normal. IMPRESSION: No active disease. Calcific atherosclerotic disease of the aorta. Electronically Signed   By: Ted Mcalpine M.D.   On: 05/01/2018 18:25   Dg Foot Complete Right  Result Date: 05/04/2018 CLINICAL DATA:  Right foot pain.  No injury EXAM: RIGHT FOOT COMPLETE - 3+ VIEW COMPARISON:  None. FINDINGS: Negative for fracture. Mild degenerative change in the first MTP. No other significant arthropathy. IMPRESSION: Mild degenerative change first MTP.  Electronically Signed   By: Marlan Palau M.D.   On: 05/04/2018 16:01       Subjective: Patient is feeling better, continue to have right leg pain moderate to severe, specially when right leg exposed to gravity, and to touch. Improved abdominal pain, no nausea or vomiting.   Discharge Exam: Vitals:   05/05/18 0600 05/05/18 0718  BP:  131/68  Pulse:  89  Resp: 16 17  Temp:  98.2 F (36.8 C)  SpO2:  95%   Vitals:   05/05/18 0400 05/05/18 0500 05/05/18 0600 05/05/18 0718  BP:    131/68  Pulse:    89  Resp: Temp:    98.2 F (36.8 C)  TempSrc:    Oral  SpO2:    95%  Weight:      Height:        General: Not in pain or dyspnea.  Neurology: Awake and alert, non focal  E ENT: no pallor, no icterus, oral mucosa moist Cardiovascular: No JVD. S1-S2 present, rhythmic, no gallops, rubs, or murmurs. No lower extremity edema. Pulmonary: vesicular breath sounds bilaterally, adequate air movement, no wheezing, rhonchi or rales. Gastrointestinal. Abdomen flat, no organomegaly, non tender, no rebound or guarding Skin. Right leg with mild erythema at the medial aspect of the ankle, tender to palpation, no increased local temperature, about 3 to 4 cm diameter, round, no further rash into the thigh or groin.  Musculoskeletal: no joint deformities   The results of significant diagnostics from this hospitalization (including imaging, microbiology, ancillary and laboratory) are listed below for reference.     Microbiology: Recent Results (from the past 240 hour(s))  Culture, blood (Routine x 2)     Status: None (Preliminary result)   Collection Time: 05/01/18  5:05 PM  Result Value Ref Range Status   Specimen Description BLOOD LEFT HAND  Final   Special Requests   Final    BOTTLES DRAWN AEROBIC ONLY Blood Culture results may not be optimal due to an inadequate volume of blood received in culture bottles   Culture   Final    NO GROWTH 3 DAYS Performed at [facility]  Lab, [address], [place], [place] [address]    Report Status PENDING  Incomplete  Culture, blood (Routine x 2)     Status: None (Preliminary result)   Collection Time: 05/01/18  5:28 PM  Result Value Ref Range Status   Specimen Description BLOOD RIGHT ANTECUBITAL  Final   Special Requests   Final    BOTTLES DRAWN AEROBIC AND ANAEROBIC Blood Culture adequate volume   Culture   Final    NO GROWTH 3 DAYS Performed at [facility], [address], [place], [place] [address]    Report Status PENDING  Incomplete  MRSA PCR Screening     Status: None   Collection Time: 05/01/18 10:34 PM  Result Value Ref Range Status   MRSA by PCR NEGATIVE NEGATIVE Final    Comment:        The GeneXpert MRSA Assay (FDA approved for NASAL specimens only), is one component of a comprehensive MRSA colonization surveillance program. It is not intended to diagnose MRSA infection nor to guide or monitor treatment for MRSA infections. Performed at [facility], [address], [place], [place] [address]      Labs: BNP (last 3 results) No results for input(s): BNP in the last 8760 hours. Basic Metabolic Panel: Recent Labs  Lab 04/30/18 0342 05/01/18 1705 05/03/18 0343 05/04/18 0321  NA 133* 135 139  --   K 4.6 4.1 3.6  --   CL 92* 99* 105  --   CO2 --   GLUCOSE 310* 127* 148*  --   BUN 25* 23* 6  --   CREATININE 1.03* 1.31* 0.80 0.86  CALCIUM 9.8 8.4* 7.8*  --    Liver Function Tests: Recent Labs  Lab 05/01/18 1705  AST 49*  ALT 38  ALKPHOS 115  BILITOT 0.4  PROT 6.4*  ALBUMIN 3.2*   Recent Labs  Lab 05/02/18 0028  LIPASE 27   No results for input(s): AMMONIA in the last 168 hours. CBC: Recent Labs  Lab 04/30/18 0342 05/01/18 1705 05/03/18 0343 05/04/18 0321  WBC 11.5* 13.2* 12.5* 9.6  NEUTROABS 8.6* 9.7*  --  6.4  HGB 14.7 12.9 11.2* 12.1  HCT 45.2 40.2 35.0* 37.4  MCV 89.9 90.3 90.9 90.8  PLT 221 195 201 235   Cardiac Enzymes: No results for  input(s): CKTOTAL, CKMB, CKMBINDEX, TROPONINI in the last 168 hours. BNP: Invalid input(s): POCBNP CBG: Recent Labs  Lab 05/04/18 0722 05/04/18 1212 05/04/18 1626 05/04/18 2125 05/05/18 0719  GLUCAP 189* 153* 203* 195* 152*   D-Dimer No results for input(s): DDIMER in the last 72 hours. Hgb A1c No results for input(s): HGBA1C in the last 72 hours. Lipid Profile Recent Labs    05/05/18 0316  CHOL 83  HDL 31*  LDLCALC 33  TRIG 93  CHOLHDL 2.7   Thyroid function studies No results for input(s): TSH, T4TOTAL, T3FREE, THYROIDAB in the last 72 hours.  Invalid input(s): FREET3 Anemia work up No results for input(s): VITAMINB12, FOLATE, FERRITIN, TIBC, IRON, RETICCTPCT in the last 72 hours. Urinalysis    Component Value Date/Time   COLORURINE YELLOW 05/01/2018 1658   APPEARANCEUR CLEAR 05/01/2018 1658   LABSPEC 1.018 05/01/2018 1658   PHURINE 5.0 05/01/2018 1658   GLUCOSEU NEGATIVE 05/01/2018 1658   HGBUR NEGATIVE 05/01/2018 1658   BILIRUBINUR NEGATIVE 05/01/2018 1658   KETONESUR NEGATIVE 05/01/2018 1658   PROTEINUR NEGATIVE 05/01/2018 1658   NITRITE NEGATIVE 05/01/2018 1658   LEUKOCYTESUR NEGATIVE 05/01/2018 1658   Sepsis Labs Invalid input(s): PROCALCITONIN,  WBC,  LACTICIDVEN Microbiology Recent Results (from the past 240 hour(s))  Culture, blood (Routine x 2)     Status: None (Preliminary result)   Collection Time: 05/01/18  5:05 PM  Result Value Ref Range Status   Specimen Description BLOOD LEFT HAND  Final   Special Requests   Final    BOTTLES DRAWN AEROBIC ONLY Blood Culture results may not be optimal due to an inadequate volume of blood received in culture bottles   Culture   Final    NO GROWTH 3 DAYS  Performed at [facility], [address], [place], [place] [address]    Report Status PENDING  Incomplete  Culture, blood (Routine x 2)     Status: None (Preliminary result)   Collection Time: 05/01/18  5:28 PM  Result Value Ref Range Status    Specimen Description BLOOD RIGHT ANTECUBITAL  Final   Special Requests   Final    BOTTLES DRAWN AEROBIC AND ANAEROBIC Blood Culture adequate volume   Culture   Final    NO GROWTH 3 DAYS Performed at [facility], [address] [place], [place] [address]    Report Status PENDING  Incomplete  MRSA PCR Screening     Status: None   Collection Time: 05/01/18 10:34 PM  Result Value Ref Range Status   MRSA by PCR NEGATIVE NEGATIVE Final    Comment:        The GeneXpert MRSA Assay (FDA approved for NASAL specimens only), is one component of a comprehensive MRSA colonization surveillance program. It is not intended to diagnose MRSA infection nor to guide or monitor treatment for MRSA infections. Performed at [facility] Lab, [address] [place], [place] [address]      Time coordinating discharge: 45 minutes  SIGNED:   Coralie Keens, MD  [facility] 05/05/2018, 10:19 AM Pager [phone]  If 7PM-7AM, please contact night-coverage [email or web address] Password [email or web address]

## 2018-05-05 NOTE — Progress Notes (Signed)
Adriana [name]  Current phone # [phone] Alternate #  [phone]

## 2018-05-05 NOTE — Plan of Care (Signed)
Discussed plan of care with patient.  Stressed importance of ambulating as much as possible.  Some teach back displayed.

## 2018-05-05 NOTE — Progress Notes (Signed)
Left via WC for POV to home with husband.  No s/s of distress.  Respirations even and unlabored.

## 2018-05-05 NOTE — Care Management Important Message (Signed)
Important Message  Patient Details  Name: Adriana [name] MRN: [id number] Date of Birth: 15-Dec-1946   Medicare Important Message Given:  Yes    Dorena Bodo 05/05/2018, 3:15 PM

## 2018-05-05 NOTE — Progress Notes (Addendum)
ANTICOAGULATION CONSULT NOTE  Pharmacy Consult for Warfarin Indication: Factor V Leiden, hx DVT  Allergies  Allergen Reactions  . Anesthesia S-I-60 Nausea And Vomiting  . Aspirin Nausea And Vomiting   Patient Measurements: Height:  (167.6 cm) Weight: 139 lb (63 kg) IBW/kg (Calculated) : 59.3  Vital Signs: Temp: 98.2 F (36.8 C) (05/20 0718) Temp Source: Oral (05/20 0718) BP: 131/68 (05/20 0718) Pulse Rate: 89 (05/20 0718)  Labs: Recent Labs    05/03/18 0343 05/04/18 0321 05/05/18 0316  HGB 11.2* 12.1  --   HCT 35.0* 37.4  --   PLT 201 235  --   LABPROT 25.8* 20.9* 22.3*  INR 2.39 1.82 1.98  CREATININE 0.80 0.86  --    Estimated Creatinine Clearance: 57 mL/min (by C-G formula based on SCr of 0.86 mg/dL).  Assessment: 12 yoF on warfarin PTA for h/o Factor V Leiden and DVT. Pharmacy consulted to dose warfarin upon admission.   PTA warfarin regimen:  PO daily except 7.5mg  on Mon/Thurs per patient discussion with RN. Last dose 5/15 PTA.  INR today is almost therapeutic (INR 1.98 << 1.82, goal of 2-3). No CBC today, was wnl from 5/19 AM labs.  No overt bleeding noted.   The patient is to be discharged on Doxycycline + Keflex which do not interfere with the INR as much as some alternatives (Bactrim/Flagyl) - however still have the potential to increase sensitivity and should be closely monitored.   Goal of Therapy:  INR 2-3 Monitor platelets by anticoagulation protocol: Yes   Plan:  - Warfarin 5 mg x 1  - Will continue to monitor for any signs/symptoms of bleeding and will follow up with PT/INR in the a.m.  - Would recommend an INR check outpatient this week to monitor closely while on antibiotics  Thank you for allowing pharmacy to be a part of this patient's care.  Georgina Pillion, PharmD, BCPS Clinical Pharmacist Pager: [phone] Clinical phone for 05/05/2018 from 7a-3:30p: [phone] If after 3:30p, please call main pharmacy at: [phone] 05/05/2018 9:57 AM

## 2018-05-05 NOTE — Progress Notes (Signed)
ABI's have been completed. Right 1.06 Left 1.01  05/05/18 3:09 PM Olen Cordial RVT

## 2018-05-06 LAB — CULTURE, BLOOD (ROUTINE X 2)
Culture: NO GROWTH
Culture: NO GROWTH
SPECIAL REQUESTS: ADEQUATE

## 2018-05-21 DIAGNOSIS — Z794 Long term (current) use of insulin: Secondary | ICD-10-CM | POA: Insufficient documentation

## 2018-06-16 LAB — PROTIME-INR

## 2018-06-27 ENCOUNTER — Ambulatory Visit: Payer: Federal, State, Local not specified - PPO | Admitting: Internal Medicine

## 2018-08-06 ENCOUNTER — Encounter (INDEPENDENT_AMBULATORY_CARE_PROVIDER_SITE_OTHER): Payer: Federal, State, Local not specified - PPO | Admitting: Ophthalmology

## 2018-08-06 NOTE — Progress Notes (Signed)
[facility] - Clinic Note  08/08/2018     CHIEF COMPLAINT Patient presents for Diabetic Eye Exam   HISTORY OF PRESENT ILLNESS: Adriana [name] is a 72 y.o. female who presents to the clinic today for:   HPI    Diabetic Eye Exam    Vision is blurred for near.  Associated Symptoms Floaters.  Negative for Flashes, Blind Spot, Photophobia, Scalp Tenderness, Fever, Weight Loss, Jaw Claudication, Glare, Pain, Distortion, Redness, Trauma, Shoulder/Hip pain and Fatigue.  Diabetes characteristics include Type 1, Type 2, taking oral medications and on insulin.  This started 20 years ago.  Blood sugar level is controlled.  Last Blood Glucose 141.  Last A1C 7.1.  I, the attending physician,  performed the HPI with the patient and updated documentation appropriately.          Comments    Pt presents for DM exam on the referral of her PCP, Dr. Evlyn Kanner, pt states she is type 2 diabetic dx 20 years ago, pt takes metformin, humalog, and basaglar, pt states her last A1C was between 7.1-7.4, checked last week, pt does check blood sugar at home, last time it was 141 after she had eaten, pt statets VA is pretty stable, unless blood sugar gets very low - in the 60s -, pt states she has recently developed floaters in her right eye only, pt is also concerned about a stye OD       Last edited by Rennis Chris, MD on 08/08/2018  2:04 PM. (History)    Pt states she just moved here from [place], [place]; Pt states she has had cataract sx by Dr. Ila Mcgill (OD: 11.06.19, OS: 10.16.17)  Referring physician: Ronney Lion, NP [address] [place], [place] [address]  HISTORICAL INFORMATION:   Selected notes from the MEDICAL RECORD NUMBER Referred by Ronney Lion, NP for DM exam LEE:  Ocular Hx- PMH-depression, HTN, high cholesterol, stroke, DM (taking metformin)    CURRENT MEDICATIONS: No current outpatient medications on file. (Ophthalmic Drugs)   No current  facility-administered medications for this visit.  (Ophthalmic Drugs)   Current Outpatient Medications (Other)  Medication Sig  . ACCU-CHEK FASTCLIX LANCETS MISC CHECK TID  . ACCU-CHEK GUIDE test strip USE TO CHECK BLOOD SUGAR TID  . B-D UF III MINI PEN NEEDLES 31G X 5 MM MISC U QID TO INJECT INSULIN UTD  . buPROPion (WELLBUTRIN XL) 150 MG 24 hr tablet Take 150 mg by mouth daily.  . chlorthalidone (HYGROTON) 25 MG tablet Take 25 mg by mouth daily.  . Cholecalciferol (VITAMIN D) 2000 units CAPS Take 2,000 Units by mouth daily.   . clobetasol cream (TEMOVATE) 0.05 % Apply 1 application topically as needed.  . Continuous Blood Gluc Sensor (FREESTYLE LIBRE 14 DAY SENSOR) MISC 1 each by Does not apply route every 14 (fourteen) days. Change every 2 weeks  . metFORMIN (GLUCOPHAGE) 1000 MG tablet Take 1,000 mg by mouth 2 (two) times daily with a meal.  . simvastatin (ZOCOR) 40 MG tablet Take 40 mg by mouth daily.  Marland Kitchen warfarin (COUMADIN) 5 MG tablet Take 5 mg by mouth daily.  Marland Kitchen acetaminophen (TYLENOL) 500 MG tablet Take 1 tablet (500 mg total) by mouth every 6 (six) hours as needed for moderate pain (or Fever >/= 101). (Patient not taking: Reported on 08/08/2018)  . buPROPion (WELLBUTRIN XL) 300 MG 24 hr tablet TK 1 T PO ONCE D  . diclofenac sodium (VOLTAREN) 1 % GEL Apply topically as needed.  Marland Kitchen  lisinopril (PRINIVIL,ZESTRIL) 5 MG tablet Take 5 mg by mouth daily.  . ondansetron (ZOFRAN ODT) 4 MG disintegrating tablet Take 1 tablet (4 mg total) by mouth every 6 (six) hours as needed. (Patient not taking: Reported on 08/08/2018)  . pantoprazole (PROTONIX) 40 MG tablet Take 1 tablet (40 mg total) by mouth daily for 10 days.  . potassium citrate (UROCIT-K) 10 MEQ (1080 MG) SR tablet Take 10 mEq by mouth 3 (three) times daily with meals.  . promethazine (PHENERGAN) 12.5 MG tablet Take 12.5 mg by mouth at bedtime.  . traMADol (ULTRAM) 50 MG tablet Take 1 tablet (50 mg total) by mouth every 6 (six) hours as  needed for severe pain. (Patient not taking: Reported on 08/08/2018)   No current facility-administered medications for this visit.  (Other)      REVIEW OF SYSTEMS: ROS    Positive for: Endocrine, Cardiovascular, Eyes, Psychiatric   Negative for: Constitutional, Gastrointestinal, Neurological, Skin, Genitourinary, Musculoskeletal, HENT, Respiratory, Allergic/Imm, Heme/Lymph   Last edited by Posey BoyerBrown, Amanda J, COT on 08/08/2018  1:40 PM. (History)       ALLERGIES Allergies  Allergen Reactions  . Anesthesia S-I-60 Nausea And Vomiting  . Aspirin Nausea And Vomiting    PAST MEDICAL HISTORY Past Medical History:  Diagnosis Date  . Depression   . DVT (deep venous thrombosis) (HCC)   . Essential hypertension   . Factor V Leiden mutation (HCC)   . HLD (hyperlipidemia)   . Poorly controlled type 2 diabetes mellitus (HCC)   . Stroke (cerebrum) [facility](HCC)    Past Surgical History:  Procedure Laterality Date  . ABDOMINAL HYSTERECTOMY    . BACK SURGERY    . Carotid artery endarterectomy    . CATARACT EXTRACTION    . CHOLECYSTECTOMY    . TONSILLECTOMY    . TUBAL LIGATION      FAMILY HISTORY Family History  Problem Relation Age of Onset  . Stroke Mother   . Lung cancer Father   . COPD Brother   . Diabetes Brother   . Cataracts Maternal Grandmother   . Diabetes Maternal Grandmother   . Amblyopia Neg Hx   . Blindness Neg Hx   . Glaucoma Neg Hx   . Hypertension Neg Hx   . Macular degeneration Neg Hx   . Retinal detachment Neg Hx   . Strabismus Neg Hx   . Retinitis pigmentosa Neg Hx     SOCIAL HISTORY Social History   Tobacco Use  . Smoking status: Former Smoker    Last attempt to quit: 2004    Years since quitting: 15.6  . Smokeless tobacco: Never Used  Substance Use Topics  . Alcohol use: Not Currently  . Drug use: Not Currently         OPHTHALMIC EXAM:  Base Eye Exam    Visual Acuity (Snellen - Linear)      Right Left   Dist [place] 20/40 -1 20/40 +1   Dist ph  [place] NI NI       Tonometry (Tonopen, 1:46 PM)      Right Left   Pressure 20 21       Pupils      Dark Light Shape React APD   Right 4 2 Round Brisk None   Left 4 2 Round Brisk None       Visual Fields (Counting fingers)      Left Right    Full Full       Extraocular Movement  Right Left    Full, Ortho Full, Ortho       Neuro/Psych    Oriented x3:  Yes   Mood/Affect:  Normal       Dilation    Both eyes:  1.0% Mydriacyl, 2.5% Phenylephrine @ 1:46 PM        Slit Lamp and Fundus Exam    Slit Lamp Exam      Right Left   Lids/Lashes Dermatochalasis - upper lid, Meibomian gland dysfunction; inspissated meibomian gland nasal upper lid Dermatochalasis - upper lid, Meibomian gland dysfunction   Conjunctiva/Sclera White and quiet White and quiet   Cornea Well healed cataract wounds, sub epi scar at 1200, Arcus Well healed cataract wounds, Arcus   Anterior Chamber Deep and quiet Deep and quiet   Iris Round and dilated, No NVI Round and dilated, No NVI   Lens Tecnis multifocal PC IOL in good position Tecnis multifocal PC IOL in good position   Vitreous Vitreous syneresis, Posterior vitreous detachment Vitreous syneresis       Fundus Exam      Right Left   Disc Pink and Sharp Pink and Sharp   C/D Ratio 0.5 0.4   Macula Blunted foveal reflex, Retinal pigment epithelial mottling, No heme or edema Blunted foveal reflex, Epiretinal membrane, Retinal pigment epithelial mottling, Drusen, no heme   Vessels Vascular attenuation Vascular attenuation   Periphery Attached, inferior cobblestoning, chorioretinal atrophy at 1030, no heme Attached, scattered cobblestoning / chorioretinal atrophy, no heme        Refraction    Manifest Refraction      Sphere Cylinder Dist VA   Right +1.50 Sphere 20/40   Left +0.75 Sphere 20/40          IMAGING AND PROCEDURES  Imaging and Procedures for @TODAY @  OCT, Retina - OU - Both Eyes       Right Eye Quality was good. Central Foveal  Thickness: 271. Progression has no prior data. Findings include normal foveal contour, no IRF, no SRF (Mild drusen).   Left Eye Quality was good. Central Foveal Thickness: 344. Progression has no prior data. Findings include abnormal foveal contour, retinal drusen , no IRF, no SRF, epiretinal membrane.   Notes *Images captured and stored on drive  Diagnosis / Impression:  OD: NFP, No IRF/SRF OS: ERM with abnormal foveal contour  Clinical management:  See below  Abbreviations: NFP - Normal foveal profile. CME - cystoid macular edema. PED - pigment epithelial detachment. IRF - intraretinal fluid. SRF - subretinal fluid. EZ - ellipsoid zone. ERM - epiretinal membrane. ORA - outer retinal atrophy. ORT - outer retinal tubulation. SRHM - subretinal hyper-reflective material                  ASSESSMENT/PLAN:    ICD-10-CM   1. Diabetes mellitus type 2 without retinopathy (HCC) E11.9   2. Epiretinal membrane (ERM) of left eye H35.372   3. Retinal edema H35.81 OCT, Retina - OU - Both Eyes  4. Pseudophakia of both eyes Z96.1   5. 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 Diabetes mellitus, type 2 without retinopathy - The incidence, risk factors for progression, natural history and treatment options for diabetic retinopathy  were discussed with patient.   - The need for close monitoring of blood glucose, blood pressure, and serum lipids, avoiding cigarette or any type of tobacco, and the need for long term follow up was also discussed with  patient. - f/u in 1 year, sooner prn  2. Epiretinal membrane OS-  - The natural history, anatomy, potential for loss of vision, and treatment options including vitrectomy techniques and the complications of endophthalmitis, retinal detachment, vitreous hemorrhage, cataract progression and permanent vision loss discussed with the patient. - not visually significant -- no surgical intervention indicated at this time -  F/U 4 months, DFE, OCT  3. No retinal edema on exam or OCT  4. Pseudophakia OU  - s/p CE/IOL OU -- Tecnis multifocal IOLs by Dr. Gershon CullSheel Patel  - beautiful surgeries, doing well  - monitor  5. MGD OU - pt particularly bothered by inspissated MG nasal upper lid OD - advised hot compresses BID OU - pt to call back for oculoplastics referral if lesion persists   Ophthalmic Meds Ordered this visit:  No orders of the defined types were placed in this encounter.      Return in about 4 months (around 12/08/2018) for F/U ERM OS, DFE, OCT.  There are no Patient Instructions on file for this visit.   Explained the diagnoses, plan, and follow up with the patient and they expressed understanding.  Patient expressed understanding of the importance of proper follow up care.   This document serves as a record of services personally performed by Karie ChimeraBrian G. Quintrell Baze, MD, PhD. It was created on their behalf by Laurian BrimAmanda Brown, OA, an ophthalmic assistant. The creation of this record is the provider's dictation and/or activities during the visit.    Electronically signed by: Laurian BrimAmanda Brown, OA  08.21.2019 11:59 PM   This document serves as a record of services personally performed by Karie ChimeraBrian G. Tryton Bodi, MD, PhD. It was created on their behalf by Virgilio BellingMeredith Fabian, COA, a certified ophthalmic assistant. The creation of this record is the provider's dictation and/or activities during the visit.  Electronically signed by: Virgilio BellingMeredith Fabian, COA  08.23.19 11:59 PM    Karie ChimeraBrian G. Calhoun Reichardt, M.D., Ph.D. Diseases & Surgery of the Retina and Vitreous [facility]   I have reviewed the above documentation for accuracy and completeness, and I agree with the above. Karie ChimeraBrian G. Vasili Fok, M.D., Ph.D. 08/09/18 11:59 PM    Abbreviations: M myopia (nearsighted); A astigmatism; H hyperopia (farsighted); P presbyopia; Mrx spectacle prescription;  CTL contact lenses; OD right eye; OS left eye; OU both eyes  XT  exotropia; ET esotropia; PEK punctate epithelial keratitis; PEE punctate epithelial erosions; DES dry eye syndrome; MGD meibomian gland dysfunction; ATs artificial tears; PFAT's preservative free artificial tears; NSC nuclear sclerotic cataract; PSC posterior subcapsular cataract; ERM epi-retinal membrane; PVD posterior vitreous detachment; RD retinal detachment; DM diabetes mellitus; DR diabetic retinopathy; NPDR non-proliferative diabetic retinopathy; PDR proliferative diabetic retinopathy; CSME clinically significant macular edema; DME diabetic macular edema; dbh dot blot hemorrhages; CWS cotton wool spot; POAG primary open angle glaucoma; C/D cup-to-disc ratio; HVF humphrey visual field; GVF goldmann visual field; OCT optical coherence tomography; IOP intraocular pressure; BRVO Branch retinal vein occlusion; CRVO central retinal vein occlusion; CRAO central retinal artery occlusion; BRAO branch retinal artery occlusion; RT retinal tear; SB scleral buckle; PPV pars plana vitrectomy; VH Vitreous hemorrhage; PRP panretinal laser photocoagulation; IVK intravitreal kenalog; VMT vitreomacular traction; MH Macular hole;  NVD neovascularization of the disc; NVE neovascularization elsewhere; AREDS age related eye disease study; ARMD age related macular degeneration; POAG primary open angle glaucoma; EBMD epithelial/anterior basement membrane dystrophy; ACIOL anterior chamber intraocular lens; IOL intraocular lens; PCIOL posterior chamber intraocular lens; Phaco/IOL phacoemulsification with intraocular lens  placement; [place] photorefractive keratectomy; LASIK laser assisted in situ keratomileusis; HTN hypertension; DM diabetes mellitus; COPD chronic obstructive pulmonary disease

## 2018-08-08 ENCOUNTER — Encounter (INDEPENDENT_AMBULATORY_CARE_PROVIDER_SITE_OTHER): Payer: Self-pay | Admitting: Ophthalmology

## 2018-08-08 ENCOUNTER — Ambulatory Visit (INDEPENDENT_AMBULATORY_CARE_PROVIDER_SITE_OTHER): Payer: Medicare Other | Admitting: Ophthalmology

## 2018-08-08 DIAGNOSIS — E119 Type 2 diabetes mellitus without complications: Secondary | ICD-10-CM | POA: Diagnosis not present

## 2018-08-08 DIAGNOSIS — H3581 Retinal edema: Secondary | ICD-10-CM

## 2018-08-08 DIAGNOSIS — H02886 Meibomian gland dysfunction of left eye, unspecified eyelid: Secondary | ICD-10-CM

## 2018-08-08 DIAGNOSIS — Z961 Presence of intraocular lens: Secondary | ICD-10-CM

## 2018-08-08 DIAGNOSIS — H35372 Puckering of macula, left eye: Secondary | ICD-10-CM

## 2018-08-08 DIAGNOSIS — H02883 Meibomian gland dysfunction of right eye, unspecified eyelid: Secondary | ICD-10-CM

## 2018-08-09 ENCOUNTER — Encounter (INDEPENDENT_AMBULATORY_CARE_PROVIDER_SITE_OTHER): Payer: Self-pay | Admitting: Ophthalmology

## 2018-09-19 ENCOUNTER — Other Ambulatory Visit: Payer: Self-pay | Admitting: Adult Health

## 2018-09-19 ENCOUNTER — Ambulatory Visit
Admission: RE | Admit: 2018-09-19 | Discharge: 2018-09-19 | Disposition: A | Discharge status: Home / Self Care | Payer: Federal, State, Local not specified - PPO | Source: Ambulatory Visit | Attending: Adult Health | Admitting: Adult Health

## 2018-09-19 DIAGNOSIS — R0600 Dyspnea, unspecified: Secondary | ICD-10-CM

## 2018-09-19 DIAGNOSIS — R000 Tachycardia, unspecified: Secondary | ICD-10-CM

## 2018-09-19 MED ORDER — IOPAMIDOL (ISOVUE-370) INJECTION 76%
75.0000 mL | Freq: Once | INTRAVENOUS | Status: AC | PRN
  Administered 2018-09-19: 75 mL via INTRAVENOUS

## 2018-10-13 ENCOUNTER — Encounter: Payer: Self-pay | Admitting: Cardiovascular Disease

## 2018-10-13 ENCOUNTER — Ambulatory Visit: Payer: Federal, State, Local not specified - PPO | Admitting: Cardiovascular Disease

## 2018-10-13 VITALS — BP 118/62 | HR 101 | Ht 66.0 in | Wt 152.0 lb

## 2018-10-13 DIAGNOSIS — E782 Mixed hyperlipidemia: Secondary | ICD-10-CM | POA: Diagnosis not present

## 2018-10-13 DIAGNOSIS — E118 Type 2 diabetes mellitus with unspecified complications: Secondary | ICD-10-CM

## 2018-10-13 DIAGNOSIS — I10 Essential (primary) hypertension: Secondary | ICD-10-CM | POA: Diagnosis not present

## 2018-10-13 DIAGNOSIS — I6523 Occlusion and stenosis of bilateral carotid arteries: Secondary | ICD-10-CM

## 2018-10-13 DIAGNOSIS — R0602 Shortness of breath: Secondary | ICD-10-CM | POA: Diagnosis not present

## 2018-10-13 DIAGNOSIS — Z794 Long term (current) use of insulin: Secondary | ICD-10-CM

## 2018-10-13 NOTE — Progress Notes (Signed)
Cardiology Office Note:    Date:  10/16/2018   ID:  Adriana [name], DOB 07/12/1946, MRN [id number]  PCP:  South, Stephen, MD  Cardiologist:  No primary care provider on file.  Electrophysiologist:  None   Referring MD: South, Stephen, MD   Chief Complaint  Patient presents with  . Shortness of Breath    History of Present Illness:    Adriana [name] is a 72 y.o. female with a hx of recurrent DVT and Factor V Leiden on long-term warfarin. The patient had a stroke at age 57. She's been on warfarin since the time of her stroke. She was found to have significant carotid stenosis at the time of her stroke and she underwent bilateral carotid endarterectomy.  She went through extensive rehab for treatment of residual right arm clumsiness and speech problems. The patient has had Type II DM for approximately 30 years. This was discovered at the time of gall bladder surgery.   Her mother had a stroke at age 43 and HTN all of her adult life. The patient is a former smoker. She also has sleep apnea but has been unable to tolerate CPAP. She is going to request a sleep study from Dr South to see if there is a different mask she might be able to tolerate.   She has complained of shortness of breath, progressive over the past 3 years. No cough or wheezing. Breathing is a little worse when she lies down at night. No PND. She sleeps on 1 pillow. She denies chest pain or pressure. No exertional chest discomfort. No leg swelling.   Past Medical History:  Diagnosis Date  . Abdominal pain 05/01/2018  . AKI (acute kidney injury) (HCC) 05/02/2018  . Cellulitis of right leg 05/01/2018  . Depression   . DVT (deep venous thrombosis) (HCC)   . Essential hypertension   . Factor V Leiden mutation (HCC)   . HLD (hyperlipidemia)   . Poorly controlled type 2 diabetes mellitus (HCC)   . 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 Sepsis (HCC) 05/01/2018  .  Stroke (cerebrum) (HCC)     Past Surgical History:  Procedure Laterality Date  . ABDOMINAL HYSTERECTOMY    . BACK SURGERY    . Carotid artery endarterectomy    . CATARACT EXTRACTION    . CHOLECYSTECTOMY    . TONSILLECTOMY    . TUBAL LIGATION      Current Medications: Current Meds  Medication Sig  . ACCU-CHEK FASTCLIX LANCETS MISC CHECK TID  . ACCU-CHEK GUIDE test strip USE TO CHECK BLOOD SUGAR TID  . acetaminophen (TYLENOL) 500 MG tablet Take 1 tablet (500 mg total) by mouth every 6 (six) hours as needed for moderate pain (or Fever >/= 101).  . B-D UF III MINI PEN NEEDLES 31G X 5 MM MISC U QID TO INJECT INSULIN UTD  . buPROPion (WELLBUTRIN XL) 150 MG 24 hr tablet Take 150 mg by mouth daily.  . buPROPion (WELLBUTRIN XL) 300 MG 24 hr tablet TK 1 T PO ONCE D  . chlorthalidone (HYGROTON) 25 MG tablet Take 25 mg by mouth daily.  . Cholecalciferol (VITAMIN D) 2000 units CAPS Take 2,000 Units by mouth daily.   . clobetasol cream (TEMOVATE) 0.05 % Apply 1 application topically as needed.  . Continuous Blood Gluc Sensor (FREESTYLE LIBRE 14 DAY SENSOR) MISC 1 each by Does not apply route every 14 (fourteen) days. Change every 2 weeks  . diclofenac sodium (  VOLTAREN) 1 % GEL Apply topically as needed.  . Insulin Glargine (BASAGLAR KWIKPEN) 100 UNIT/ML SOPN Inject 24 Units into the skin at bedtime.  . insulin lispro (HUMALOG) 100 UNIT/ML injection Inject 12 Units into the skin 3 (three) times daily before meals.  . lisinopril (PRINIVIL,ZESTRIL) 5 MG tablet Take 5 mg by mouth daily.  . metFORMIN (GLUCOPHAGE) 1000 MG tablet Take 1,000 mg by mouth 2 (two) times daily with a meal.  . potassium citrate (UROCIT-K) 10 MEQ (1080 MG) SR tablet Take 10 mEq by mouth 3 (three) times daily with meals.  . promethazine (PHENERGAN) 12.5 MG tablet Take 12.5 mg by mouth at bedtime.  . simvastatin (ZOCOR) 40 MG tablet Take 40 mg by mouth daily.  . warfarin (COUMADIN) 5 MG tablet Take 5 mg by mouth daily.  .  [DISCONTINUED] ondansetron (ZOFRAN ODT) 4 MG disintegrating tablet Take 1 tablet (4 mg total) by mouth every 6 (six) hours as needed.  . [DISCONTINUED] traMADol (ULTRAM) 50 MG tablet Take 1 tablet (50 mg total) by mouth every 6 (six) hours as needed for severe pain.     Allergies:   Anesthesia s-i-60 and Aspirin   Social History   Socioeconomic History  . Marital status: Married    Spouse name: Not on file  . Number of children: Not on file  . Years of education: Not on file  . Highest education level: Not on file  Occupational History  . Not on file  Social Needs  . Financial resource strain: Not on file  . Food insecurity:    Worry: Not on file    Inability: Not on file  . Transportation needs:    Medical: Not on file    Non-medical: Not on file  Tobacco Use  . Smoking status: Former Smoker    Last attempt to quit: 2004    Years since quitting: 15.8  . Smokeless tobacco: Never Used  Substance and Sexual Activity  . Alcohol use: Not Currently  . Drug use: Not Currently  . Sexual activity: Not on file  Lifestyle  . Physical activity:    Days per week: Not on file    Minutes per session: Not on file  . Stress: Not on file  Relationships  . Social connections:    Talks on phone: Not on file    Gets together: Not on file    Attends religious service: Not on file    Active member of club or organization: Not on file    Attends meetings of clubs or organizations: Not on file    Relationship status: Not on file  Other Topics Concern  . Not on file  Social History Narrative  . Not on file     Family History: The patient's family history includes COPD in her brother; Cataracts in her maternal grandmother; Diabetes in her brother and maternal grandmother; Lung cancer in her father; Stroke in her mother. There is no history of Amblyopia, Blindness, Glaucoma, Hypertension, Macular degeneration, Retinal detachment, Strabismus, or Retinitis pigmentosa.  ROS:   Please see  the history of present illness.    Positive for visual disturbance, muscle pain, rash, excessive fatigue, and snoring, right arm pain (nonexertional). All other systems reviewed and are negative.  EKGs/Labs/Other Studies Reviewed:    The following studies were reviewed today: CTA Chest: FINDINGS: Cardiovascular: Satisfactory opacification of the pulmonary arteries to the segmental level. No evidence of pulmonary embolism. Normal heart size. No pericardial effusion. Atherosclerosis of thoracic aorta is noted   without aneurysm or dissection. Mild coronary artery calcifications are noted.  Mediastinum/Nodes: No enlarged mediastinal, hilar, or axillary lymph nodes. Thyroid gland, trachea, and esophagus demonstrate no significant findings.  Lungs/Pleura: Lungs are clear. No pleural effusion or pneumothorax.  Upper Abdomen: No acute abnormality.  Musculoskeletal: No chest wall abnormality. No acute or significant osseous findings.  Review of the MIP images confirms the above findings.  IMPRESSION: No definite evidence of pulmonary embolus.  EKG:  EKG is not ordered today.  The ekg from 09-18-2018 demonstrates sinus tachycardia 101 bpm, otherwise within normal limits  Recent Labs: 05/01/2018: ALT 38 05/03/2018: BUN 6; Potassium 3.6; Sodium 139 05/04/2018: Creatinine, Ser 0.86; Hemoglobin 12.1; Platelets 235  Recent Lipid Panel    Component Value Date/Time   CHOL 83 05/05/2018 0316   TRIG 93 05/05/2018 0316   HDL 31 (L) 05/05/2018 0316   CHOLHDL 2.7 05/05/2018 0316   VLDL 19 05/05/2018 0316   LDLCALC 33 05/05/2018 0316    Physical Exam:    VS:  BP 118/62   Pulse (!) 101   Ht 5' 6" (1.676 m)   Wt 152 lb (68.9 kg)   SpO2 97%   BMI 24.53 kg/m     Wt Readings from Last 3 Encounters:  10/13/18 152 lb (68.9 kg)  05/01/18 139 lb (63 kg)  04/29/18 139 lb (63 kg)     GEN: Well nourished, well developed in no acute distress HEENT: Normal NECK: No JVD; No carotid  bruits LYMPHATICS: No lymphadenopathy CARDIAC: RRR, no murmurs, rubs, gallops RESPIRATORY:  Clear to auscultation without rales, wheezing or rhonchi  ABDOMEN: Soft, non-tender, non-distended MUSCULOSKELETAL:  No edema; No deformity  SKIN: Warm and dry NEUROLOGIC:  Alert and oriented x 3 PSYCHIATRIC:  Normal affect   ASSESSMENT:    1. Shortness of breath   2. Type 2 diabetes mellitus with complication, with long-term current use of insulin (HCC)   3. Mixed hyperlipidemia   4. Essential hypertension   5. Bilateral carotid artery stenosis    PLAN:    In order of problems listed above:  1. Cardiopulmonary exam benign. Pt at significant risk of obstructive CAD, systolic or diastolic dysfunction. I have recommended an echocardiogram and exercise myoview stress test to evaluate for ischemic heart disease.  2. Followed by Dr South. Treated with insulin and oral hypoglycemics.  3. Treated with simvastatin. LDL 33 mg/dL.  4. BP contolled on lisinopril, chlorthalidone. 5. Pt on warfarin, statin.   Medication Adjustments/Labs and Tests Ordered: Current medicines are reviewed at length with the patient today.  Concerns regarding medicines are outlined above.  Orders Placed This Encounter  Procedures  . MYOCARDIAL PERFUSION IMAGING  . ECHOCARDIOGRAM COMPLETE   No orders of the defined types were placed in this encounter.   Patient Instructions  Medication Instructions:  Your provider recommends that you continue on your current medications as directed. Please refer to the Current Medication list given to you today.    Labwork: None  Testing/Procedures: Your provider has requested that you have an echocardiogram. Echocardiography is a painless test that uses sound waves to create images of your heart. It provides your doctor with information about the size and shape of your heart and how well your heart's chambers and valves are working. This procedure takes approximately one hour.  There are no restrictions for this procedure.    Dr. Voyd Groft recommends you have a NUCLEAR STRESS TEST.  Follow-Up: Your provider wants you to follow-up in: 1 year with Dr. Harvie Morua   or his assistant. You will receive a reminder letter in the mail two months in advance. If you don't receive a letter, please call our office to schedule the follow-up appointment.    Any Other Special Instructions Will Be Listed Below (If Applicable).     If you need a refill on your cardiac medications before your next appointment, please call your pharmacy.      Signed, Mikiala Fugett, MD  10/16/2018 10:40 PM    [facility] 

## 2018-10-13 NOTE — H&P (View-Only) (Signed)
Cardiology Office Note:    Date:  10/16/2018   ID:  Adriana, [name] 13-Jun-1946, MRN [id number]  PCP:  [name], MD  Cardiologist:  No primary care provider on file.  Electrophysiologist:  None   Referring MD: [name], MD   Chief Complaint  Patient presents with  . Shortness of Breath    History of Present Illness:    Adriana [name] is a 72 y.o. female with a hx of recurrent DVT and Factor V Leiden on long-term warfarin. The patient had a stroke at age 20. She's been on warfarin since the time of her stroke. She was found to have significant carotid stenosis at the time of her stroke and she underwent bilateral carotid endarterectomy.  She went through extensive rehab for treatment of residual right arm clumsiness and speech problems. The patient has had Type II DM for approximately 30 years. This was discovered at the time of gall bladder surgery.   Her mother had a stroke at age 62 and HTN all of her adult life. The patient is a former smoker. She also has sleep apnea but has been unable to tolerate CPAP. She is going to request a sleep study from Dr Evlyn Kanner to see if there is a different mask she might be able to tolerate.   She has complained of shortness of breath, progressive over the past 3 years. No cough or wheezing. Breathing is a little worse when she lies down at night. No PND. She sleeps on 1 pillow. She denies chest pain or pressure. No exertional chest discomfort. No leg swelling.   Past Medical History:  Diagnosis Date  . Abdominal pain 05/01/2018  . AKI (acute kidney injury) (HCC) 05/02/2018  . Cellulitis of right leg 05/01/2018  . Depression   . DVT (deep venous thrombosis) (HCC)   . Essential hypertension   . Factor V Leiden mutation (HCC)   . HLD (hyperlipidemia)   . Poorly controlled type 2 diabetes mellitus (HCC)   . 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 Sepsis (HCC) 05/01/2018  .  Stroke (cerebrum) [facility])     Past Surgical History:  Procedure Laterality Date  . ABDOMINAL HYSTERECTOMY    . BACK SURGERY    . Carotid artery endarterectomy    . CATARACT EXTRACTION    . CHOLECYSTECTOMY    . TONSILLECTOMY    . TUBAL LIGATION      Current Medications: Current Meds  Medication Sig  . ACCU-CHEK FASTCLIX LANCETS MISC CHECK TID  . ACCU-CHEK GUIDE test strip USE TO CHECK BLOOD SUGAR TID  . acetaminophen (TYLENOL) 500 MG tablet Take 1 tablet (500 mg total) by mouth every 6 (six) hours as needed for moderate pain (or Fever >/= 101).  . B-D UF III MINI PEN NEEDLES 31G X 5 MM MISC U QID TO INJECT INSULIN UTD  . buPROPion (WELLBUTRIN XL) 150 MG 24 hr tablet Take 150 mg by mouth daily.  Marland Kitchen buPROPion (WELLBUTRIN XL) 300 MG 24 hr tablet TK 1 T PO ONCE D  . chlorthalidone (HYGROTON) 25 MG tablet Take 25 mg by mouth daily.  . Cholecalciferol (VITAMIN D) 2000 units CAPS Take 2,000 Units by mouth daily.   . clobetasol cream (TEMOVATE) 0.05 % Apply 1 application topically as needed.  . Continuous Blood Gluc Sensor (FREESTYLE LIBRE 14 DAY SENSOR) MISC 1 each by Does not apply route every 14 (fourteen) days. Change every 2 weeks  . diclofenac sodium (  VOLTAREN) 1 % GEL Apply topically as needed.  . Insulin Glargine (BASAGLAR KWIKPEN) 100 UNIT/ML SOPN Inject 24 Units into the skin at bedtime.  . insulin lispro (HUMALOG) 100 UNIT/ML injection Inject 12 Units into the skin 3 (three) times daily before meals.  Marland Kitchen lisinopril (PRINIVIL,ZESTRIL) 5 MG tablet Take 5 mg by mouth daily.  . metFORMIN (GLUCOPHAGE) 1000 MG tablet Take 1,000 mg by mouth 2 (two) times daily with a meal.  . potassium citrate (UROCIT-K) 10 MEQ (1080 MG) SR tablet Take 10 mEq by mouth 3 (three) times daily with meals.  . promethazine (PHENERGAN) 12.5 MG tablet Take 12.5 mg by mouth at bedtime.  . simvastatin (ZOCOR) 40 MG tablet Take 40 mg by mouth daily.  Marland Kitchen warfarin (COUMADIN) 5 MG tablet Take 5 mg by mouth daily.  .  [DISCONTINUED] ondansetron (ZOFRAN ODT) 4 MG disintegrating tablet Take 1 tablet (4 mg total) by mouth every 6 (six) hours as needed.  . [DISCONTINUED] traMADol (ULTRAM) 50 MG tablet Take 1 tablet (50 mg total) by mouth every 6 (six) hours as needed for severe pain.     Allergies:   Anesthesia s-i-60 and Aspirin   Social History   Socioeconomic History  . Marital status: Married    Spouse name: Not on file  . Number of children: Not on file  . Years of education: Not on file  . Highest education level: Not on file  Occupational History  . Not on file  Social Needs  . Financial resource strain: Not on file  . Food insecurity:    Worry: Not on file    Inability: Not on file  . Transportation needs:    Medical: Not on file    Non-medical: Not on file  Tobacco Use  . Smoking status: Former Smoker    Last attempt to quit: 2004    Years since quitting: 15.8  . Smokeless tobacco: Never Used  Substance and Sexual Activity  . Alcohol use: Not Currently  . Drug use: Not Currently  . Sexual activity: Not on file  Lifestyle  . Physical activity:    Days per week: Not on file    Minutes per session: Not on file  . Stress: Not on file  Relationships  . Social connections:    Talks on phone: Not on file    Gets together: Not on file    Attends religious service: Not on file    Active member of club or organization: Not on file    Attends meetings of clubs or organizations: Not on file    Relationship status: Not on file  Other Topics Concern  . Not on file  Social History Narrative  . Not on file     Family History: The patient's family history includes COPD in her brother; Cataracts in her maternal grandmother; Diabetes in her brother and maternal grandmother; Lung cancer in her father; Stroke in her mother. There is no history of Amblyopia, Blindness, Glaucoma, Hypertension, Macular degeneration, Retinal detachment, Strabismus, or Retinitis pigmentosa.  ROS:   Please see  the history of present illness.    Positive for visual disturbance, muscle pain, rash, excessive fatigue, and snoring, right arm pain (nonexertional). All other systems reviewed and are negative.  EKGs/Labs/Other Studies Reviewed:    The following studies were reviewed today: CTA Chest: FINDINGS: Cardiovascular: Satisfactory opacification of the pulmonary arteries to the segmental level. No evidence of pulmonary embolism. Normal heart size. No pericardial effusion. Atherosclerosis of thoracic aorta is noted  without aneurysm or dissection. Mild coronary artery calcifications are noted.  Mediastinum/Nodes: No enlarged mediastinal, hilar, or axillary lymph nodes. Thyroid gland, trachea, and esophagus demonstrate no significant findings.  Lungs/Pleura: Lungs are clear. No pleural effusion or pneumothorax.  Upper Abdomen: No acute abnormality.  Musculoskeletal: No chest wall abnormality. No acute or significant osseous findings.  Review of the MIP images confirms the above findings.  IMPRESSION: No definite evidence of pulmonary embolus.  EKG:  EKG is not ordered today.  The ekg from 09-18-2018 demonstrates sinus tachycardia 101 bpm, otherwise within normal limits  Recent Labs: 05/01/2018: ALT 38 05/03/2018: BUN 6; Potassium 3.6; Sodium 139 05/04/2018: Creatinine, Ser 0.86; Hemoglobin 12.1; Platelets 235  Recent Lipid Panel    Component Value Date/Time   CHOL 83 05/05/2018 0316   TRIG 93 05/05/2018 0316   HDL 31 (L) 05/05/2018 0316   CHOLHDL 2.7 05/05/2018 0316   VLDL 19 05/05/2018 0316   LDLCALC 33 05/05/2018 0316    Physical Exam:    VS:  BP 118/62   Pulse (!) 101   Ht 5\' 6"  (1.676 m)   Wt 152 lb (68.9 kg)   SpO2 97%   BMI 24.53 kg/m     Wt Readings from Last 3 Encounters:  10/13/18 152 lb (68.9 kg)  05/01/18 139 lb (63 kg)  04/29/18 139 lb (63 kg)     GEN: Well nourished, well developed in no acute distress HEENT: Normal NECK: No JVD; No carotid  bruits LYMPHATICS: No lymphadenopathy CARDIAC: RRR, no murmurs, rubs, gallops RESPIRATORY:  Clear to auscultation without rales, wheezing or rhonchi  ABDOMEN: Soft, non-tender, non-distended MUSCULOSKELETAL:  No edema; No deformity  SKIN: Warm and dry NEUROLOGIC:  Alert and oriented x 3 PSYCHIATRIC:  Normal affect   ASSESSMENT:    1. Shortness of breath   2. Type 2 diabetes mellitus with complication, with long-term current use of insulin (HCC)   3. Mixed hyperlipidemia   4. Essential hypertension   5. Bilateral carotid artery stenosis    PLAN:    In order of problems listed above:  1. Cardiopulmonary exam benign. Pt at significant risk of obstructive CAD, systolic or diastolic dysfunction. I have recommended an echocardiogram and exercise myoview stress test to evaluate for ischemic heart disease.  2. Followed by Dr Evlyn Kanner. Treated with insulin and oral hypoglycemics.  3. Treated with simvastatin. LDL 33 mg/dL.  4. BP contolled on lisinopril, chlorthalidone. 5. Pt on warfarin, statin.   Medication Adjustments/Labs and Tests Ordered: Current medicines are reviewed at length with the patient today.  Concerns regarding medicines are outlined above.  Orders Placed This Encounter  Procedures  . MYOCARDIAL PERFUSION IMAGING  . ECHOCARDIOGRAM COMPLETE   No orders of the defined types were placed in this encounter.   Patient Instructions  Medication Instructions:  Your provider recommends that you continue on your current medications as directed. Please refer to the Current Medication list given to you today.    Labwork: None  Testing/Procedures: Your provider has requested that you have an echocardiogram. Echocardiography is a painless test that uses sound waves to create images of your heart. It provides your doctor with information about the size and shape of your heart and how well your heart's chambers and valves are working. This procedure takes approximately one hour.  There are no restrictions for this procedure.    Dr. Excell Seltzer recommends you have a NUCLEAR STRESS TEST.  Follow-Up: Your provider wants you to follow-up in: 1 year with Dr. Excell Seltzer  or his assistant. You will receive a reminder letter in the mail two months in advance. If you don't receive a letter, please call our office to schedule the follow-up appointment.    Any Other Special Instructions Will Be Listed Below (If Applicable).     If you need a refill on your cardiac medications before your next appointment, please call your pharmacy.      Signed, [name], MD  10/16/2018 10:40 PM    [facility]

## 2018-10-13 NOTE — Patient Instructions (Signed)
Medication Instructions:  Your provider recommends that you continue on your current medications as directed. Please refer to the Current Medication list given to you today.    Labwork: None  Testing/Procedures: Your provider has requested that you have an echocardiogram. Echocardiography is a painless test that uses sound waves to create images of your heart. It provides your doctor with information about the size and shape of your heart and how well your heart's chambers and valves are working. This procedure takes approximately one hour. There are no restrictions for this procedure.    Dr. Excell Seltzer recommends you have a NUCLEAR STRESS TEST.  Follow-Up: Your provider wants you to follow-up in: 1 year with Dr. Excell Seltzer or his assistant. You will receive a reminder letter in the mail two months in advance. If you don't receive a letter, please call our office to schedule the follow-up appointment.    Any Other Special Instructions Will Be Listed Below (If Applicable).     If you need a refill on your cardiac medications before your next appointment, please call your pharmacy.

## 2018-10-27 ENCOUNTER — Telehealth (HOSPITAL_COMMUNITY): Payer: Self-pay | Admitting: *Deleted

## 2018-10-27 NOTE — Telephone Encounter (Signed)
Patient given detailed instructions per Myocardial Perfusion Study Information Sheet for the test on 10/29/18 at 1015. Patient notified to arrive 15 minutes early and that it is imperative to arrive on time for appointment to keep from having the test rescheduled.  If you need to cancel or reschedule your appointment, please call the office within 24 hours of your appointment. . Patient verbalized understanding.Adriana [name], Adriana [name]

## 2018-10-29 ENCOUNTER — Ambulatory Visit (HOSPITAL_COMMUNITY): Payer: Federal, State, Local not specified - PPO | Attending: Cardiovascular Disease

## 2018-10-29 ENCOUNTER — Ambulatory Visit (HOSPITAL_BASED_OUTPATIENT_CLINIC_OR_DEPARTMENT_OTHER): Payer: Federal, State, Local not specified - PPO

## 2018-10-29 ENCOUNTER — Other Ambulatory Visit: Payer: Self-pay

## 2018-10-29 DIAGNOSIS — R0602 Shortness of breath: Secondary | ICD-10-CM

## 2018-10-29 LAB — MYOCARDIAL PERFUSION IMAGING
CHL CUP MPHR: 150 {beats}/min
CHL CUP NUCLEAR SDS: 10
CHL CUP NUCLEAR SSS: 18
CSEPEDS: 0 s
CSEPPHR: 144 {beats}/min
Estimated workload: 5.8 METS
Exercise duration (min): 4 min
LV dias vol: 64 mL (ref 46–106)
LVSYSVOL: 38 mL
NUC STRESS TID: 1.12
Percent HR: 96 %
Rest HR: 90 {beats}/min
SRS: 7

## 2018-10-29 LAB — ECHOCARDIOGRAM COMPLETE
HEIGHTINCHES: 66 in
WEIGHTICAEL: 2432 [oz_av]

## 2018-10-29 MED ORDER — TECHNETIUM TC 99M TETROFOSMIN IV KIT
31.3000 | PACK | Freq: Once | INTRAVENOUS | Status: AC | PRN
  Administered 2018-10-29: 31.3 via INTRAVENOUS
  Filled 2018-10-29: qty 32

## 2018-10-29 MED ORDER — TECHNETIUM TC 99M TETROFOSMIN IV KIT
10.8000 | PACK | Freq: Once | INTRAVENOUS | Status: AC | PRN
  Administered 2018-10-29: 10.8 via INTRAVENOUS
  Filled 2018-10-29: qty 11

## 2018-10-29 MED ORDER — PERFLUTREN LIPID MICROSPHERE
1.0000 mL | INTRAVENOUS | Status: AC | PRN
  Administered 2018-10-29: 2 mL via INTRAVENOUS

## 2018-10-31 ENCOUNTER — Ambulatory Visit: Payer: Federal, State, Local not specified - PPO | Admitting: Cardiovascular Disease

## 2018-10-31 ENCOUNTER — Telehealth: Payer: Self-pay

## 2018-10-31 DIAGNOSIS — R9439 Abnormal result of other cardiovascular function study: Secondary | ICD-10-CM

## 2018-10-31 NOTE — Addendum Note (Signed)
Addended by: Gunnar FusiKEMP, KATHRYN A on: 10/31/2018 05:33 PM   Modules accepted: Orders

## 2018-10-31 NOTE — Telephone Encounter (Signed)
[facility] scheduled 11/19 with Dr. Excell Seltzerooper.  The patient will have labs drawn 11/18 AM and pick up instruction letter.  Confirmed with Dr. Rinaldo [employer]'s office she will not need Lovenox bridge. The patient will hold her Coumadin starting today. Dr. Rinaldo [employer]'s office will schedule her for INR check after catheterization.  The patient agrees with treatment plan.

## 2018-10-31 NOTE — Telephone Encounter (Signed)
-----   Message from Tonny BollmanMichael Cooper, MD sent at 10/31/2018  1:58 PM EST ----- I have reviewed the findings with the patient.  I think cardiac catheterization is indicated with this high risk nuclear scan.  The patient is having no typical symptoms of angina, but she does have diabetes and is at risk for silent ischemia.  She is experiencing shortness of breath with activity which could be an anginal equivalent for her.  We plan on proceeding with her catheterization next week.  I have reviewed the risks, indications, and alternatives to cardiac catheterization and possible PCI with her.

## 2018-10-31 NOTE — Telephone Encounter (Signed)
-----   Message from Tonny BollmanMichael Cooper, MD sent at 10/31/2018  1:59 PM EST ----- Findings noted.  Wall motion abnormalities correlate with the abnormality seen on her nuclear scan.  We will proceed with cardiac catheterization.  I will review her images and determine whether she will need a TEE or further imaging assessment for left atrial myxoma.

## 2018-11-03 ENCOUNTER — Telehealth: Payer: Self-pay | Admitting: *Deleted

## 2018-11-03 ENCOUNTER — Other Ambulatory Visit: Payer: Federal, State, Local not specified - PPO | Admitting: *Deleted

## 2018-11-03 DIAGNOSIS — R9439 Abnormal result of other cardiovascular function study: Secondary | ICD-10-CM

## 2018-11-03 LAB — BASIC METABOLIC PANEL
BUN / CREAT RATIO: 31 — AB (ref 12–28)
BUN: 22 mg/dL (ref 8–27)
CO2: 24 mmol/L (ref 20–29)
Calcium: 9.8 mg/dL (ref 8.7–10.3)
Chloride: 97 mmol/L (ref 96–106)
Creatinine, Ser: 0.72 mg/dL (ref 0.57–1.00)
GFR calc Af Amer: 98 mL/min/{1.73_m2} (ref >59)
GFR calc non Af Amer: 85 mL/min/{1.73_m2} (ref >59)
GLUCOSE: 175 mg/dL — AB (ref 65–99)
POTASSIUM: 4 mmol/L (ref 3.5–5.2)
SODIUM: 136 mmol/L (ref 134–144)

## 2018-11-03 LAB — CBC WITH DIFFERENTIAL/PLATELET
BASOS: 0 %
Basophils Absolute: 0 10*3/uL (ref 0.0–0.2)
EOS (ABSOLUTE): 0.1 10*3/uL (ref 0.0–0.4)
Eos: 1 %
HEMOGLOBIN: 14.7 g/dL (ref 11.1–15.9)
Hematocrit: 43 % (ref 34.0–46.6)
LYMPHS ABS: 2.6 10*3/uL (ref 0.7–3.1)
Lymphs: 26 %
MCH: 30.5 pg (ref 26.6–33.0)
MCHC: 34.2 g/dL (ref 31.5–35.7)
MCV: 89 fL (ref 79–97)
MONOS ABS: 0.8 10*3/uL (ref 0.1–0.9)
Monocytes: 7 %
NEUTROS PCT: 66 %
Neutrophils Absolute: 6.9 10*3/uL (ref 1.4–7.0)
PLATELETS: 257 10*3/uL (ref 150–450)
RBC: 4.82 x10E6/uL (ref 3.77–5.28)
RDW: 14 % (ref 12.3–15.4)
WBC: 10.4 10*3/uL (ref 3.4–10.8)

## 2018-11-03 LAB — PROTIME-INR
INR: 1.1 (ref 0.8–1.2)
Prothrombin Time: 11.1 s (ref 9.1–12.0)

## 2018-11-03 LAB — APTT: APTT: 22 s — AB (ref 24–33)

## 2018-11-03 NOTE — Telephone Encounter (Addendum)
Pt [facility]: Tuesday November 04, 2018 8 AM [facility] at: 6 AM  No solid food after midnight prior to cath, clear liquids until 5 AM day of procedure. Contrast allergy: no  Hold: Insulin-AM of procedure. Metformin-day of procedure and 48 hours post procedure. 1/2 Insulin-PM prior to procedure. Coumadin-last dose 10/30/18 until post procedure. Chlorthalidone-AM of procedure. KCl-AM of procedure.   Except hold medications AM meds can be  taken pre-cath with sip of water including: ASA 81 mg-pt states she can take and tolerate 1 dose of aspirin.  Confirmed patient has responsible person to drive home post procedure and for 24 hours after you arrive home: yes  Pt states she had diarrhea until 5 AM this morning, since 5 AM she has had 1 episode of diarrhea. Pt states she thinks chicken casserole she ate yesterday did not agree with her. Pt has been eating and drinking today, pt encouraged to drink plenty of fluids today. Pt denies fever, generally feels good, wants to proceed with cath. Pt advised I will review with Dr Excell Seltzerooper once lab results are final.   Reviewed with Dr Excell Seltzerooper, okay to proceed with cath tomorrow, pt aware and states no more diarrhea, able to tolerate bland food and liquids.

## 2018-11-04 ENCOUNTER — Encounter (HOSPITAL_COMMUNITY): Payer: Self-pay | Admitting: Cardiovascular Disease

## 2018-11-04 ENCOUNTER — Ambulatory Visit (HOSPITAL_COMMUNITY)
Admission: RE | Admit: 2018-11-04 | Discharge: 2018-11-04 | Disposition: A | Discharge status: Home / Self Care | Payer: Federal, State, Local not specified - PPO | Source: Ambulatory Visit | Attending: Cardiovascular Disease | Admitting: Cardiovascular Disease

## 2018-11-04 ENCOUNTER — Encounter: Payer: Self-pay | Admitting: Cardiovascular Disease

## 2018-11-04 ENCOUNTER — Other Ambulatory Visit: Payer: Self-pay

## 2018-11-04 ENCOUNTER — Encounter (HOSPITAL_COMMUNITY)
Admission: RE | Disposition: A | Discharge status: Home / Self Care | Payer: Self-pay | Source: Ambulatory Visit | Attending: Cardiovascular Disease

## 2018-11-04 DIAGNOSIS — Z884 Allergy status to anesthetic agent status: Secondary | ICD-10-CM | POA: Insufficient documentation

## 2018-11-04 DIAGNOSIS — I6523 Occlusion and stenosis of bilateral carotid arteries: Secondary | ICD-10-CM | POA: Diagnosis not present

## 2018-11-04 DIAGNOSIS — Z833 Family history of diabetes mellitus: Secondary | ICD-10-CM | POA: Insufficient documentation

## 2018-11-04 DIAGNOSIS — I2582 Chronic total occlusion of coronary artery: Secondary | ICD-10-CM | POA: Insufficient documentation

## 2018-11-04 DIAGNOSIS — Z9849 Cataract extraction status, unspecified eye: Secondary | ICD-10-CM | POA: Insufficient documentation

## 2018-11-04 DIAGNOSIS — I2584 Coronary atherosclerosis due to calcified coronary lesion: Secondary | ICD-10-CM | POA: Insufficient documentation

## 2018-11-04 DIAGNOSIS — Z87891 Personal history of nicotine dependence: Secondary | ICD-10-CM | POA: Insufficient documentation

## 2018-11-04 DIAGNOSIS — Z9851 Tubal ligation status: Secondary | ICD-10-CM | POA: Diagnosis not present

## 2018-11-04 DIAGNOSIS — Z8249 Family history of ischemic heart disease and other diseases of the circulatory system: Secondary | ICD-10-CM | POA: Insufficient documentation

## 2018-11-04 DIAGNOSIS — Z79899 Other long term (current) drug therapy: Secondary | ICD-10-CM | POA: Diagnosis not present

## 2018-11-04 DIAGNOSIS — Z8673 Personal history of transient ischemic attack (TIA), and cerebral infarction without residual deficits: Secondary | ICD-10-CM | POA: Diagnosis not present

## 2018-11-04 DIAGNOSIS — Z8619 Personal history of other infectious and parasitic diseases: Secondary | ICD-10-CM | POA: Insufficient documentation

## 2018-11-04 DIAGNOSIS — I2510 Atherosclerotic heart disease of native coronary artery without angina pectoris: Secondary | ICD-10-CM | POA: Diagnosis not present

## 2018-11-04 DIAGNOSIS — D6851 Activated protein C resistance: Secondary | ICD-10-CM | POA: Insufficient documentation

## 2018-11-04 DIAGNOSIS — Z9049 Acquired absence of other specified parts of digestive tract: Secondary | ICD-10-CM | POA: Diagnosis not present

## 2018-11-04 DIAGNOSIS — E119 Type 2 diabetes mellitus without complications: Secondary | ICD-10-CM | POA: Diagnosis not present

## 2018-11-04 DIAGNOSIS — E782 Mixed hyperlipidemia: Secondary | ICD-10-CM | POA: Diagnosis not present

## 2018-11-04 DIAGNOSIS — Z90710 Acquired absence of both cervix and uterus: Secondary | ICD-10-CM | POA: Insufficient documentation

## 2018-11-04 DIAGNOSIS — Z7901 Long term (current) use of anticoagulants: Secondary | ICD-10-CM | POA: Insufficient documentation

## 2018-11-04 DIAGNOSIS — Z9889 Other specified postprocedural states: Secondary | ICD-10-CM | POA: Insufficient documentation

## 2018-11-04 DIAGNOSIS — I10 Essential (primary) hypertension: Secondary | ICD-10-CM | POA: Insufficient documentation

## 2018-11-04 DIAGNOSIS — Z86718 Personal history of other venous thrombosis and embolism: Secondary | ICD-10-CM | POA: Diagnosis not present

## 2018-11-04 DIAGNOSIS — Z886 Allergy status to analgesic agent status: Secondary | ICD-10-CM | POA: Diagnosis not present

## 2018-11-04 DIAGNOSIS — Z794 Long term (current) use of insulin: Secondary | ICD-10-CM | POA: Diagnosis not present

## 2018-11-04 DIAGNOSIS — R931 Abnormal findings on diagnostic imaging of heart and coronary circulation: Secondary | ICD-10-CM | POA: Insufficient documentation

## 2018-11-04 DIAGNOSIS — R0602 Shortness of breath: Secondary | ICD-10-CM | POA: Insufficient documentation

## 2018-11-04 DIAGNOSIS — Z823 Family history of stroke: Secondary | ICD-10-CM | POA: Insufficient documentation

## 2018-11-04 HISTORY — PX: LEFT HEART CATH AND CORONARY ANGIOGRAPHY: CATH118249

## 2018-11-04 LAB — PROTIME-INR
INR: 1.03
PROTHROMBIN TIME: 13.4 s (ref 11.4–15.2)

## 2018-11-04 LAB — GLUCOSE, CAPILLARY
Glucose-Capillary: 201 mg/dL — ABNORMAL HIGH (ref 70–99)
Glucose-Capillary: 142 mg/dL — ABNORMAL HIGH (ref 70–99)

## 2018-11-04 SURGERY — LEFT HEART CATH AND CORONARY ANGIOGRAPHY
Anesthesia: LOCAL

## 2018-11-04 MED ORDER — VERAPAMIL HCL 2.5 MG/ML IV SOLN
INTRAVENOUS | Status: AC
  Filled 2018-11-04: qty 2

## 2018-11-04 MED ORDER — HEPARIN (PORCINE) IN NACL 1000-0.9 UT/500ML-% IV SOLN
INTRAVENOUS | Status: DC | PRN
  Administered 2018-11-04: 500 mL

## 2018-11-04 MED ORDER — VERAPAMIL HCL 2.5 MG/ML IV SOLN
INTRAVENOUS | Status: DC | PRN
  Administered 2018-11-04: 10 mL via INTRA_ARTERIAL

## 2018-11-04 MED ORDER — SODIUM CHLORIDE 0.9% FLUSH: 3.0000 mL | Freq: Two times a day (BID) | INTRAVENOUS | Status: DC

## 2018-11-04 MED ORDER — FENTANYL CITRATE (PF) 100 MCG/2ML IJ SOLN
INTRAMUSCULAR | Status: AC
  Filled 2018-11-04: qty 2

## 2018-11-04 MED ORDER — MIDAZOLAM HCL 2 MG/2ML IJ SOLN
INTRAMUSCULAR | Status: AC
  Filled 2018-11-04: qty 2

## 2018-11-04 MED ORDER — SODIUM CHLORIDE 0.9 % WEIGHT BASED INFUSION: 1.0000 mL/kg/h | INTRAVENOUS | Status: DC

## 2018-11-04 MED ORDER — ONDANSETRON HCL 4 MG/2ML IJ SOLN: 4.0000 mg | Freq: Four times a day (QID) | INTRAMUSCULAR | Status: DC | PRN

## 2018-11-04 MED ORDER — IOHEXOL 350 MG/ML SOLN
INTRAVENOUS | Status: DC | PRN
  Administered 2018-11-04: 90 mL via INTRA_ARTERIAL

## 2018-11-04 MED ORDER — SODIUM CHLORIDE 0.9% FLUSH: 3.0000 mL | INTRAVENOUS | Status: DC | PRN

## 2018-11-04 MED ORDER — METOPROLOL TARTRATE 50 MG PO TABS
ORAL_TABLET | ORAL | Status: AC
  Filled 2018-11-04: qty 1

## 2018-11-04 MED ORDER — HEPARIN SODIUM (PORCINE) 1000 UNIT/ML IJ SOLN
INTRAMUSCULAR | Status: DC | PRN
  Administered 2018-11-04: 3500 [IU] via INTRAVENOUS

## 2018-11-04 MED ORDER — HEPARIN (PORCINE) IN NACL 1000-0.9 UT/500ML-% IV SOLN
INTRAVENOUS | Status: AC
  Filled 2018-11-04: qty 1000

## 2018-11-04 MED ORDER — ASPIRIN 81 MG PO CHEW
81.0000 mg | CHEWABLE_TABLET | ORAL | Status: AC
  Administered 2018-11-04: 81 mg via ORAL
  Filled 2018-11-04: qty 1

## 2018-11-04 MED ORDER — SODIUM CHLORIDE 0.9 % IV SOLN: 250.0000 mL | INTRAVENOUS | Status: DC | PRN

## 2018-11-04 MED ORDER — METOPROLOL TARTRATE 25 MG PO TABS: 12.5000 mg | ORAL_TABLET | Freq: Two times a day (BID) | ORAL | 11 refills | Status: DC

## 2018-11-04 MED ORDER — LIDOCAINE HCL (PF) 1 % IJ SOLN
INTRAMUSCULAR | Status: DC | PRN
  Administered 2018-11-04: 2 mL

## 2018-11-04 MED ORDER — METOPROLOL TARTRATE 12.5 MG HALF TABLET
12.5000 mg | ORAL_TABLET | Freq: Once | ORAL | Status: AC
  Administered 2018-11-04: 12:00:00 via ORAL
  Filled 2018-11-04: qty 1

## 2018-11-04 MED ORDER — SODIUM CHLORIDE 0.9 % WEIGHT BASED INFUSION
3.0000 mL/kg/h | INTRAVENOUS | Status: AC
  Administered 2018-11-04: 3 mL/kg/h via INTRAVENOUS

## 2018-11-04 MED ORDER — LIDOCAINE HCL (PF) 1 % IJ SOLN
INTRAMUSCULAR | Status: AC
  Filled 2018-11-04: qty 30

## 2018-11-04 MED ORDER — ACETAMINOPHEN 325 MG PO TABS: 650.0000 mg | ORAL_TABLET | ORAL | Status: DC | PRN

## 2018-11-04 MED ORDER — FENTANYL CITRATE (PF) 100 MCG/2ML IJ SOLN
INTRAMUSCULAR | Status: DC | PRN
  Administered 2018-11-04: 25 ug via INTRAVENOUS

## 2018-11-04 MED ORDER — MIDAZOLAM HCL 2 MG/2ML IJ SOLN
INTRAMUSCULAR | Status: DC | PRN
  Administered 2018-11-04: 1 mg via INTRAVENOUS

## 2018-11-04 SURGICAL SUPPLY — 10 items
CATH 5FR JL3.5 JR4 ANG PIG MP (CATHETERS) ×2 IMPLANT
GLIDESHEATH SLEND SS 6F .021 (SHEATH) ×2 IMPLANT
GUIDEWIRE INQWIRE 1.5J.035X260 (WIRE) ×1 IMPLANT
INQWIRE 1.5J .035X260CM (WIRE) ×2
KIT HEART LEFT (KITS) ×2 IMPLANT
PACK CARDIAC CATHETERIZATION (CUSTOM PROCEDURE TRAY) ×2 IMPLANT
SYR MEDRAD MARK 7 150ML (SYRINGE) ×2 IMPLANT
TRANSDUCER W/STOPCOCK (MISCELLANEOUS) ×2 IMPLANT
TUBING CIL FLEX 10 FLL-RA (TUBING) ×2 IMPLANT
WIRE HI TORQ VERSACORE-J 145CM (WIRE) ×2 IMPLANT

## 2018-11-04 NOTE — Progress Notes (Signed)
Per Dr Excell Seltzerooper client advised to resume coumadin tonight and notified Dr Excell Seltzerooper of heart rate and order noted

## 2018-11-04 NOTE — Discharge Instructions (Signed)
Radial Site Care °Refer to this sheet in the next few weeks. These instructions provide you with information about caring for yourself after your procedure. Your health care provider may also give you more specific instructions. Your treatment has been planned according to current medical practices, but problems sometimes occur. Call your health care provider if you have any problems or questions after your procedure. °What can I expect after the procedure? °After your procedure, it is typical to have the following: °· Bruising at the radial site that usually fades within 1-2 weeks. °· Blood collecting in the tissue (hematoma) that may be painful to the touch. It should usually decrease in size and tenderness within 1-2 weeks. ° °Follow these instructions at home: °· Take medicines only as directed by your health care provider. °· You may shower 24-48 hours after the procedure or as directed by your health care provider. Remove the bandage (dressing) and gently wash the site with plain soap and water. Pat the area dry with a clean towel. Do not rub the site, because this may cause bleeding. °· Do not take baths, swim, or use a hot tub until your health care provider approves. °· Check your insertion site every day for redness, swelling, or drainage. °· Do not apply powder or lotion to the site. °· Do not flex or bend the affected arm for 24 hours or as directed by your health care provider. °· Do not push or pull heavy objects with the affected arm for 24 hours or as directed by your health care provider. °· Do not lift over 10 lb (4.5 kg) for 5 days after your procedure or as directed by your health care provider. °· Ask your health care provider when it is okay to: °? Return to work or school. °? Resume usual physical activities or sports. °? Resume sexual activity. °· Do not drive home if you are discharged the same day as the procedure. Have someone else drive you. °· You may drive 24 hours after the procedure  unless otherwise instructed by your health care provider. °· Do not operate machinery or power tools for 24 hours after the procedure. °· If your procedure was done as an outpatient procedure, which means that you went home the same day as your procedure, a responsible adult should be with you for the first 24 hours after you arrive home. °· Keep all follow-up visits as directed by your health care provider. This is important. °Contact a health care provider if: °· You have a fever. °· You have chills. °· You have increased bleeding from the radial site. Hold pressure on the site. °Get help right away if: °· You have unusual pain at the radial site. °· You have redness, warmth, or swelling at the radial site. °· You have drainage (other than a small amount of blood on the dressing) from the radial site. °· The radial site is bleeding, and the bleeding does not stop after 30 minutes of holding steady pressure on the site. °· Your arm or hand becomes pale, cool, tingly, or numb. °This information is not intended to replace advice given to you by your health care provider. Make sure you discuss any questions you have with your health care provider. °Document Released: 01/05/2011 Document Revised: 05/10/2016 Document Reviewed: 06/21/2014 °Elsevier Interactive Patient Education © 2018 [employer] ° ° ° °Moderate Conscious Sedation, Adult, Care After °These instructions provide you with information about caring for yourself after your procedure. Your health care provider   may also give you more specific instructions. Your treatment has been planned according to current medical practices, but problems sometimes occur. Call your health care provider if you have any problems or questions after your procedure. °What can I expect after the procedure? °After your procedure, it is common: °· To feel sleepy for several hours. °· To feel clumsy and have poor balance for several hours. °· To have poor judgment for several  hours. °· To vomit if you eat too soon. ° °Follow these instructions at home: °For at least 24 hours after the procedure: ° °· Do not: °? Participate in activities where you could fall or become injured. °? Drive. °? Use heavy machinery. °? Drink alcohol. °? Take sleeping pills or medicines that cause drowsiness. °? Make important decisions or sign legal documents. °? Take care of children on your own. °· Rest. °Eating and drinking °· Follow the diet recommended by your health care provider. °· If you vomit: °? Drink water, juice, or soup when you can drink without vomiting. °? Make sure you have little or no nausea before eating solid foods. °General instructions °· Have a responsible adult stay with you until you are awake and alert. °· Take over-the-counter and prescription medicines only as told by your health care provider. °· If you smoke, do not smoke without supervision. °· Keep all follow-up visits as told by your health care provider. This is important. °Contact a health care provider if: °· You keep feeling nauseous or you keep vomiting. °· You feel light-headed. °· You develop a rash. °· You have a fever. °Get help right away if: °· You have trouble breathing. °This information is not intended to replace advice given to you by your health care provider. Make sure you discuss any questions you have with your health care provider. °Document Released: 09/23/2013 Document Revised: 05/07/2016 Document Reviewed: 03/24/2016 °Elsevier Interactive Patient Education © 2018 [employer] ° °

## 2018-11-04 NOTE — Interval H&P Note (Signed)
Cath Lab Visit (complete for each Cath Lab visit)  Clinical Evaluation Leading to the Procedure:   ACS: No.  Non-ACS:    Anginal Classification: CCS II  Anti-ischemic medical therapy: No Therapy  Non-Invasive Test Results: High-risk stress test findings: cardiac mortality >3%/year  Prior CABG: No previous CABG      History and Physical Interval Note:  11/04/2018 7:20 AM  Oran ReinVictoria Leigh Alla FeelingChampion  has presented today for surgery, with the diagnosis of abnormal stress test  The various methods of treatment have been discussed with the patient and family. After consideration of risks, benefits and other options for treatment, the patient has consented to  Procedure(s): LEFT HEART CATH AND CORONARY ANGIOGRAPHY (N/A) as a surgical intervention .  The patient's history has been reviewed, patient examined, no change in status, stable for surgery.  I have reviewed the patient's chart and labs.  Questions were answered to the patient's satisfaction.     Tonny BollmanMichael Cashawn Yanko

## 2018-11-07 ENCOUNTER — Other Ambulatory Visit: Payer: Self-pay

## 2018-11-07 ENCOUNTER — Institutional Professional Consult (permissible substitution): Payer: Federal, State, Local not specified - PPO | Admitting: Surgery

## 2018-11-07 ENCOUNTER — Encounter: Payer: Self-pay | Admitting: Surgery

## 2018-11-07 ENCOUNTER — Other Ambulatory Visit: Payer: Self-pay | Admitting: *Deleted

## 2018-11-07 VITALS — BP 138/70 | HR 88 | Resp 18 | Ht 66.0 in | Wt 149.0 lb

## 2018-11-07 DIAGNOSIS — I2510 Atherosclerotic heart disease of native coronary artery without angina pectoris: Secondary | ICD-10-CM

## 2018-11-07 DIAGNOSIS — I25119 Atherosclerotic heart disease of native coronary artery with unspecified angina pectoris: Secondary | ICD-10-CM | POA: Diagnosis not present

## 2018-11-08 ENCOUNTER — Encounter: Payer: Self-pay | Admitting: Surgery

## 2018-11-08 NOTE — Progress Notes (Signed)
PCP is [name], MD Referring Provider is [name], MD  Chief Complaint  Patient presents with  . Coronary Artery Disease    new patient consultation, Cath 11/04/18, TEE 10/29/18    HPI:  The patient is a 72 year old woman with a history of poorly controlled type 2 diabetes, hypertension, hyperlipidemia, factor V Leiden mutation with DVT, cerebrovascular disease and stroke status post bilateral carotid endarterectomy, who presents with a history of progressive exertional shortness of breath and fatigue.  She was evaluated by Dr. Excell Seltzer and underwent echocardiogram on 10/29/2018.  This showed an ejection fraction of 50 to 55% with grade 1 diastolic dysfunction.  There was hypokinesis of the mid anteroseptal, mid inferior septal, mid apical inferior, apical septal, and apical myocardium.  A nuclear stress test on 10/29/2018 was high risk study showed an ejection fraction of 30 to 44% with no ST segment changes during stress.  There is a medium size defect of moderate severity in the basal inferoseptal, mid inferior septal, apical septal, and apical inferior locations.  There is a second severe defect of medium size in the mid anterior, mid anterior septal, apical anterior, apical septal, and apical location consistent with prior myocardial infarction with peri-infarct ischemia in the anterior septal, inferoseptal, and apical regions.  Cardiac catheterization on 11/04/2018 showed chronic total occlusion of the right coronary artery which was codominant with left-to-right collaterals from the left circumflex.  There is chronic subtotal occlusion of the LAD filling antegrade and also by collaterals from the right.  There is severe diffuse proximal and mid LAD disease with stenosis in the first diagonal branch.  There is severe diffuse obtuse marginal stenosis.  Left ventricular end-diastolic pressure was mildly elevated at 16 mmHg.  Left ventricular ejection fraction was 50 to 55%.  The  patient is here today with her daughter and husband.  She reports worsening exertional shortness of breath and fatigue over the past few years which is becoming more noticeable over the past 6 months.  She is also recently had significant heartburn type lower chest pain which she has never had in the past.  She does note some shortness of breath when lying down at night.  She says that her prior DVT and phlebitis have been in the right lower extremity only.  Past Medical History:  Diagnosis Date  . Abdominal pain 05/01/2018  . AKI (acute kidney injury) (HCC) 05/02/2018  . Cellulitis of right leg 05/01/2018  . Depression   . DVT (deep venous thrombosis) (HCC)   . Essential hypertension   . Factor V Leiden mutation (HCC)   . HLD (hyperlipidemia)   . Poorly controlled type 2 diabetes mellitus (HCC)   . 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 Sepsis (HCC) 05/01/2018  . Stroke (cerebrum) [facility])     Past Surgical History:  Procedure Laterality Date  . ABDOMINAL HYSTERECTOMY    . BACK SURGERY    . Carotid artery endarterectomy    . CATARACT EXTRACTION    . CHOLECYSTECTOMY    . LEFT HEART CATH AND CORONARY ANGIOGRAPHY N/A 11/04/2018   Procedure: LEFT HEART CATH AND CORONARY ANGIOGRAPHY;  Surgeon: [name], MD;  Location: [facility] INVASIVE CV LAB;  Service: Cardiovascular;  Laterality: N/A;  . TONSILLECTOMY    . TUBAL LIGATION      Family History  Problem Relation Age of Onset  . Stroke Mother   . Lung cancer Father   . COPD Brother   .  Diabetes Brother   . Cataracts Maternal Grandmother   . Diabetes Maternal Grandmother   . Amblyopia Neg Hx   . Blindness Neg Hx   . Glaucoma Neg Hx   . Hypertension Neg Hx   . Macular degeneration Neg Hx   . Retinal detachment Neg Hx   . Strabismus Neg Hx   . Retinitis pigmentosa Neg Hx     Social History Social History   Tobacco Use  . Smoking status: Former Smoker    Last attempt to quit:  2004    Years since quitting: 15.9  . Smokeless tobacco: Never Used  Substance Use Topics  . Alcohol use: Not Currently  . Drug use: Not Currently    Current Outpatient Medications  Medication Sig Dispense Refill  . acetaminophen (TYLENOL) 500 MG tablet Take 1 tablet (500 mg total) by mouth every 6 (six) hours as needed for moderate pain (or Fever >/= 101). (Patient taking differently: Take 500-1,000 mg by mouth every 6 (six) hours as needed for moderate pain, fever or headache. ) 20 tablet 0  . buPROPion (WELLBUTRIN XL) 300 MG 24 hr tablet Take 300 mg by mouth daily.   2  . calcium carbonate (ANTACID) 500 MG chewable tablet Chew 2 tablets by mouth daily as needed for indigestion or heartburn.    . chlorthalidone (HYGROTON) 25 MG tablet Take 25 mg by mouth daily.    . Cholecalciferol (VITAMIN D) 2000 units CAPS Take 2,000 Units by mouth daily.     . clobetasol cream (TEMOVATE) 0.05 % Apply 1 application topically daily as needed (for psoriasis).     . Continuous Blood Gluc Sensor (FREESTYLE LIBRE 14 DAY SENSOR) MISC 1 each by Does not apply route every 14 (fourteen) days. Change every 2 weeks 2 each 11  . diclofenac sodium (VOLTAREN) 1 % GEL Apply 1 application topically 2 (two) times daily as needed (for pain).     . Insulin Glargine (BASAGLAR KWIKPEN) 100 UNIT/ML SOPN Inject 24 Units into the skin at bedtime.    . insulin lispro (HUMALOG) 100 UNIT/ML injection Inject 12 Units into the skin 3 (three) times daily before meals.    Marland Kitchen lisinopril (PRINIVIL,ZESTRIL) 5 MG tablet Take 5 mg by mouth daily.    . metFORMIN (GLUCOPHAGE) 1000 MG tablet Take 1,000 mg by mouth 2 (two) times daily with a meal.    . metoprolol tartrate (LOPRESSOR) 25 MG tablet Take 0.5 tablets (12.5 mg total) by mouth 2 (two) times daily. 60 tablet 11  . potassium citrate (UROCIT-K) 10 MEQ (1080 MG) SR tablet Take 10 mEq by mouth 2 (two) times daily.     . promethazine (PHENERGAN) 25 MG tablet Take 12.5 mg by mouth every  evening.   1  . simvastatin (ZOCOR) 40 MG tablet Take 40 mg by mouth daily.    Marland Kitchen warfarin (COUMADIN) 5 MG tablet Take 5 mg by mouth every evening.     . white petrolatum (VASELINE) GEL Apply 1 application topically daily as needed (for psoriasis).     No current facility-administered medications for this visit.     Allergies  Allergen Reactions  . Anesthesia S-I-60 Nausea And Vomiting  . Aspirin Nausea And Vomiting    Review of Systems  Constitutional: Positive for activity change and fatigue.  HENT: Negative.   Eyes: Negative.   Respiratory: Positive for shortness of breath. Negative for chest tightness.   Cardiovascular: Negative for chest pain, palpitations and leg swelling.  Gastrointestinal:  Frequent heartburn  Endocrine: Negative.   Genitourinary: Negative.   Musculoskeletal: Positive for arthralgias.  Skin: Negative.   Neurological:       History of prior stroke.  History of neuropathy.  Hematological:       Factor V Leiden mutation with history of DVT.  She is on chronic Coumadin therapy  Psychiatric/Behavioral: Negative.     BP 138/70 (BP Location: Left Arm, Patient Position: Sitting, Cuff Size: Normal)   Pulse 88   Resp 18   Ht 5\' 6"  (1.676 m)   Wt 149 lb (67.6 kg)   SpO2 93% Comment: RA  BMI 24.05 kg/m  Physical Exam  Constitutional: She is oriented to person, place, and time. She appears well-developed and well-nourished.  HENT:  Head: Normocephalic and atraumatic.  Mouth/Throat: Oropharynx is clear and moist.  Eyes: Pupils are equal, round, and reactive to light. Conjunctivae and EOM are normal.  Neck: Normal range of motion. Neck supple. No JVD present. No thyromegaly present.  Cardiovascular: Normal rate, regular rhythm, normal heart sounds and intact distal pulses.  No murmur heard. Pulmonary/Chest: Effort normal and breath sounds normal. No respiratory distress.  Abdominal: Soft. Bowel sounds are normal. She exhibits no distension. There is no  tenderness.  Musculoskeletal: Normal range of motion. She exhibits no edema.  Lymphadenopathy:    She has no cervical adenopathy.  Neurological: She is alert and oriented to person, place, and time. She has normal strength. No cranial nerve deficit or sensory deficit.  Skin: Skin is warm and dry.  Psychiatric: She has a normal mood and affect.     Diagnostic Tests:  Procedures   LEFT HEART CATH AND CORONARY ANGIOGRAPHY  Conclusion     Prox RCA lesion is 100% stenosed.  Mid LM lesion is 30% stenosed.  There is mild left ventricular systolic dysfunction.  LV end diastolic pressure is mildly elevated.  The left ventricular ejection fraction is 50-55% by visual estimate.  Ost 1st Mrg to 1st Mrg lesion is 75% stenosed.  Ost LAD lesion is 90% stenosed.  Ost 1st Diag to 1st Diag lesion is 80% stenosed.  Prox LAD-1 lesion is 80% stenosed.  Prox LAD-2 lesion is 80% stenosed.   1. 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 Chronic subtotal occlusion of the LAD, filling antegrade but also supplied by right-left collaterals 3. Severe diffuse proximal and mid-LAD and first diagonal stenoses 4. Severe diffuse OM stenosis 5. Mild segmental LV systolic dysfunction  Recommend outpatient TCTS consultation for consideration of CABG in this patient with diabetes, LV dysfunction, and multivessel CAD     Indications   Abnormal nuclear cardiac imaging test [R93.1 (ICD-10-CM)]  Procedural Details/Technique   Technical Details INDICATION: 72 yo woman with hx of Factor V Leiden on chronic warfarin, longstanding insulin-dependent diabetes, and prior stroke, presenting with exertional dyspnea and an abnormal Myoview scan demonstrating reduced LVEF with anteroseptal and inferoseptal infarct and peri-infarct ischemia. Referred for cardiac catheterization and possible PCI.   PROCEDURAL DETAILS: The right wrist was prepped, draped,  and anesthetized with 1% lidocaine. Using the modified Seldinger technique, a 5/6 French Slender sheath was introduced into the right radial artery. 3 mg of verapamil was administered through the sheath, weight-based unfractionated heparin was administered intravenously. Standard Judkins catheters were used for selective coronary angiography and left ventriculography. Catheter exchanges were performed over an exchange length guidewire. There were no immediate procedural complications. A TR band was used for radial hemostasis at the  completion of the procedure. The patient was transferred to the post catheterization recovery area for further monitoring.     Estimated blood loss <50 mL.  During this procedure the patient was administered the following to achieve and maintain moderate conscious sedation: Versed 1 mg, Fentanyl 25 mcg, while the patient's heart rate, blood pressure, and oxygen saturation were continuously monitored. The period of conscious sedation was 28 minutes, of which I was present face-to-face 100% of this time.  Coronary Findings   Diagnostic  Dominance: Right  Left Main  Mid LM lesion 30% stenosed  Mid LM lesion is 30% stenosed. The lesion is moderately calcified.  Left Anterior Descending  Collaterals  Prox LAD filled by collaterals from Ost RCA.    Ost LAD lesion 90% stenosed  Ost LAD lesion is 90% stenosed. The lesion is eccentric. The lesion is calcified. Severe ostial LAD stenosis with heavy calcification  Prox LAD-1 lesion 80% stenosed  Prox LAD-1 lesion is 80% stenosed.  Prox LAD-2 lesion 80% stenosed  Prox LAD-2 lesion is 80% stenosed.  First Diagonal Branch  Ost 1st Diag to 1st Diag lesion 80% stenosed  Ost 1st Diag to 1st Diag lesion is 80% stenosed.  Left Circumflex  First Obtuse Marginal Branch  Ost 1st Mrg to 1st Mrg lesion 75% stenosed  Ost 1st Mrg to 1st Mrg lesion is 75% stenosed. the first OM divides into twin branches, with the superior branch  showing 50% stenosis and the larger inferior branch showing diffuse 75% stenosis.  Right Coronary Artery  Prox RCA lesion 100% stenosed  Prox RCA lesion is 100% stenosed. The lesion is moderately calcified. collateralized from the LCA, chronic occlusion  Acute Marginal Branch  Collaterals  Acute Mrg filled by collaterals from Dist Cx.    Right Posterior Descending Artery  Collaterals  RPDA filled by collaterals from Dist Cx.    Intervention   No interventions have been documented.  Wall Motion   Resting       The mid anterior, basilar anterior, basilar inferior and apical anterior segments are normal. The mid inferior and apical inferior segments are hypokinetic.           Left Heart   Left Ventricle The left ventricular size is normal. There is mild left ventricular systolic dysfunction. LV end diastolic pressure is mildly elevated. The left ventricular ejection fraction is 50-55% by visual estimate. There are LV function abnormalities due to segmental dysfunction. There is hypokinesis of the inferoapical wall, but overall LVEF is only mildly reduced, estimated at 50-55%  Coronary Diagrams   Diagnostic Diagram       Implants    No implant documentation for this case.  MERGE Images   Show images for CARDIAC CATHETERIZATION   Link to Procedure Log   Procedure Log    Hemo Data    Most Recent Value  Aortic Mean Gradient 16.25 mmHg  Aortic Peak Gradient 2 mmHg  AO Systolic Pressure 123 mmHg  AO Diastolic Pressure 59 mmHg  AO Mean 86 mmHg  LV Systolic Pressure 168 mmHg  LV Diastolic Pressure 4 mmHg  LV EDP 16 mmHg  AOp Systolic Pressure 169 mmHg  AOp Diastolic Pressure 73 mmHg  AOp Mean Pressure 112 mmHg  LVp Systolic Pressure 171 mmHg  LVp Diastolic Pressure 1 mmHg  LVp EDP Pressure 26 mmHg        *[facility]*  [address]                        [place], [place] [address]                             [phone]  ------------------------------------------------------------------- Transthoracic Echocardiography  Patient:    Adriana SagoChampion, Adriana [name] MR #:       [id number] Study Date: 10/29/2018 Gender:     F Age:        72 Height:     167.6 cm Weight:     68.9 kg BSA:        1.8 m^2 Pt. Status: Room:   Dora SimsDERING     Michael Cooper, MD  REFERRING    Tonny BollmanMichael Cooper, MD  SONOGRAPHER  Aida RaiderNaTashia Rodgers, RDCS  PERFORMING   [facility], Outpatient  ATTENDING    Cristal Deerhristopher, Bridgette  cc:  ------------------------------------------------------------------- LV EF: 50% -   55%  ------------------------------------------------------------------- Indications:      R06.02 Shortness of breath.  ECHO WITH DEFINITY.   ------------------------------------------------------------------- History:   PMH:  Acquired from the patient and from the patient&'s chart.  PMH:  DVT. Stroke.  Risk factors:  Former tobacco use. Hypertension. Diabetes mellitus. Dyslipidemia.  ------------------------------------------------------------------- Study Conclusions  - Left ventricle: The cavity size was normal. There was moderate   concentric hypertrophy. Systolic function was normal. The   estimated ejection fraction was in the range of 50% to 55%.   Doppler parameters are consistent with abnormal left ventricular   relaxation (grade 1 diastolic dysfunction). Acoustic contrast   opacification revealed no evidence ofthrombus. - Regional wall motion abnormality: Hypokinesis of the mid   anteroseptal, mid inferoseptal, mid-apical inferior, apical   septal, and apical myocardium. - Mitral valve: There was trivial regurgitation. - Right ventricle: Systolic function was normal. - Right atrium: There was a possible, small, fixed mass on the   right side of the interatrial septum. This is best seen on image   51. Consider myxoma. - Atrial septum: The septum was thickened. A patent foramen ovale   cannot  be excluded. There was a possiblemasson the right side of   the interatrial septum. - Tricuspid valve: There was trivial regurgitation.  Impressions:  - 1. Low normal EF, but wall motion abnormalities noted.   Hypokinesis of mid to distal anteroseptum, inferoseptum, and   inferior walls as well as the apex. Echo contrast (definity) used   to better define endocardial borders and exclude LV thrombus)     2. Apical views suggest the presence of a small, fixed mass   adjacent to the intra-atrial septum. Consider mxyoma.  ------------------------------------------------------------------- Study data:   Study status:  Routine.  Procedure:  The patient reported no pain pre or post test. Transthoracic echocardiography for left ventricular function evaluation, for right ventricular function evaluation, and for assessment of valvular function. Image quality was adequate. Intravenous contrast (Definity) was administered to enhance regional wall motion assessment and opacify the LV.  Study completion:  There were no complications. Transthoracic echocardiography.  M-mode, complete 2D, spectral Doppler, and color Doppler.  Birthdate:  Patient birthdate: [birth year].  Age:  Patient is 72 yr old.  Sex:  Gender: female. BMI: 24.5 kg/m^2.  Blood pressure:     118/62  Patient status: Outpatient.  Study date:  Study date: 10/29/2018. Study time: 10:52 AM.  Location:  [facility]  -------------------------------------------------------------------  ------------------------------------------------------------------- Left ventricle:  The  cavity size was normal. There was moderate concentric hypertrophy. Systolic function was normal. The estimated ejection fraction was in the range of 50% to 55%.  Acoustic contrast opacification revealed no evidence ofthrombus.  Regional wall motion abnormalities:  Hypokinesis of the mid anteroseptal, mid inferoseptal, mid-apical inferior, apical septal, and  apical myocardium. Doppler parameters are consistent with abnormal left ventricular relaxation (grade 1 diastolic dysfunction).  ------------------------------------------------------------------- Aortic valve:   Trileaflet; normal thickness leaflets. Mobility was not restricted.  Doppler:  Transvalvular velocity was within the normal range. There was no stenosis. There was no regurgitation.   ------------------------------------------------------------------- Aorta:  Aortic root: The aortic root was normal in size.  ------------------------------------------------------------------- Mitral valve:   Structurally normal valve.   Mobility was not restricted.  Doppler:  Transvalvular velocity was within the normal range. There was no evidence for stenosis. There was trivial regurgitation.  ------------------------------------------------------------------- Left atrium:  The atrium was normal in size.  ------------------------------------------------------------------- Atrial septum:  Not well visualized. The septum was thickened. A patent foramen ovale cannot be excluded. There was a possiblemasson the right side of the interatrial septum.  ------------------------------------------------------------------- Right ventricle:  The cavity size was normal. Wall thickness was normal. Systolic function was normal.  ------------------------------------------------------------------- Pulmonic valve:   Poorly visualized.  The valve appears to be grossly normal.    Doppler:  Transvalvular velocity was within the normal range. There was no evidence for stenosis. There was no significant regurgitation.  ------------------------------------------------------------------- Tricuspid valve:   Structurally normal valve.    Doppler: Transvalvular velocity was within the normal range. There was trivial  regurgitation.  ------------------------------------------------------------------- Pulmonary artery:   The main pulmonary artery was normal-sized. Systolic pressure could not be accurately estimated.  ------------------------------------------------------------------- Right atrium:  The atrium was normal in size. There was a possible, small, fixed mass on the right side of the interatrial septum. This is best seen on image 51. Consider myxoma.  ------------------------------------------------------------------- Pericardium:  There was no pericardial effusion.  ------------------------------------------------------------------- Systemic veins: Inferior vena cava: Not visualized.  ------------------------------------------------------------------- Measurements   Left ventricle                          Value        Reference  LV ID, ED, PLAX chordal         (L)     36    mm     43 - 52  LV ID, ES, PLAX chordal                 25    mm     23 - 38  LV fx shortening, PLAX chordal          31    %      >=29  LV PW thickness, ED                     14    mm     ----------  IVS/LV PW ratio, ED                     1            <=1.3  Stroke volume, 2D                       54    ml     ----------  Stroke volume/bsa, 2D  30    ml/m^2 ----------  LV e&', lateral                          4.68  cm/s   ----------  LV E/e&', lateral                        13.68        ----------  LV e&', medial                           5.66  cm/s   ----------  LV E/e&', medial                         11.31        ----------  LV e&', average                          5.17  cm/s   ----------  LV E/e&', average                        12.38        ----------    Ventricular septum                      Value        Reference  IVS thickness, ED                       14    mm     ----------    LVOT                                    Value        Reference  LVOT ID, S                               19    mm     ----------  LVOT area                               2.84  cm^2   ----------  LVOT ID                                 19    mm     ----------  LVOT peak velocity, S                   98.9  cm/s   ----------  LVOT mean velocity, S                   71.7  cm/s   ----------  LVOT VTI, S                             18.9  cm     ----------  Stroke volume (SV), LVOT DP             53.6  ml     ----------  Stroke index (SV/bsa), LVOT DP  29.8  ml/m^2 ----------    Aorta                                   Value        Reference  Aortic root ID, ED                      31    mm     ----------  Ascending aorta ID, A-P, S              34    mm     ----------    Left atrium                             Value        Reference  LA ID, A-P, ES                          38    mm     ----------  LA ID/bsa, A-P                          2.11  cm/m^2 <=2.2  LA volume, S                            33.1  ml     ----------  LA volume/bsa, S                        18.4  ml/m^2 ----------  LA volume, ES, 1-p A4C                  30.4  ml     ----------  LA volume/bsa, ES, 1-p A4C              16.9  ml/m^2 ----------  LA volume, ES, 1-p A2C                  35.3  ml     ----------  LA volume/bsa, ES, 1-p A2C              19.6  ml/m^2 ----------    Mitral valve                            Value        Reference  Mitral E-wave peak velocity             64    cm/s   ----------  Mitral A-wave peak velocity             98.5  cm/s   ----------  Mitral deceleration time        (H)     278   ms     150 - 230  Mitral E/A ratio, peak                  0.6          ----------    Right atrium                            Value        Reference  RA ID, S-I, ES, A4C             (L)     33.4  mm     34 - 49  RA area, ES, A4C                (L)     7.56  cm^2   8.3 - 19.5  RA volume, ES, A/L                      14    ml     ----------  RA volume/bsa, ES, A/L                  7.8   ml/m^2 ----------     Right ventricle                         Value        Reference  RV ID, minor axis, ED, A4C base         24    mm     ----------  TAPSE                                   18.9  mm     ----------  RV s&', lateral, S                       8.81  cm/s   ----------  Legend: (L)  and  (H)  mark values outside specified reference range.  ------------------------------------------------------------------- Prepared and Electronically Authenticated by  Jodelle Red 2019-11-13T14:40:19   Impression:  This 72 year old woman has severe three-vessel coronary disease with progressive exertional fatigue and shortness of breath and a high risk nuclear stress test showing prior infarction and significant peri-infarct ischemia in the anterior septal, inferoseptal, and apical regions.  I suspect that her exertional shortness of breath, fatigue, and heartburn are probably ischemic symptoms.  I agree that coronary bypass graft surgery is the best treatment to prevent further ischemia and infarction and improve her quality of life.  I reviewed the cardiac catheterization films with the patient and her family and answered all of their questions.  I discussed the operative procedure with the patient and family including alternatives, benefits and risks; including but not limited to bleeding, blood transfusion, infection, stroke, myocardial infarction, graft failure, heart block requiring a permanent pacemaker, organ dysfunction, and death.  Adriana Sago understands and agrees to proceed.    Plan:  We will perform coronary bypass graft surgery on Monday, 11/17/2018.  She will stop her Coumadin 4 days preoperatively and will stop metformin 2 days preoperatively.  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   I spent 60 minutes performing this consultation and > 50% of this time was spent face to  face counseling and coordinating the care of this patient's severe three-vessel coronary disease.   Alleen Borne, MD [facility] [phone]

## 2018-11-11 NOTE — Pre-Procedure Instructions (Signed)
Adriana [name]  11/11/2018      [facility][id number] [place], [place] - [address] AT [facility] [address] [facility] [address] [place] [id number] Phone: [phone] Fax: [phone]  [facility] [place] - [id number] AT Portal to Registered [facility] Sites [address] [place] [id number] Phone: [phone] Fax: [phone]    Your procedure is scheduled on Monday December 2nd.  [facility] at 0530 A.M.  Call this number if you have problems the morning of surgery:  [phone]   Remember:  Do not eat or drink after midnight.    Take these medicines the morning of surgery with A SIP OF WATER   acetaminophen (TYLENOL)  buPROPion (WELLBUTRIN XL)  metoprolol tartrate (LOPRESSOR)  simvastatin (ZOCOR)  Follow your surgeon's instructions on when to stop/resume your warfarin (COUMADIN) and Aspirin. If no instructions were given to you, call your surgeon's office.    7 days prior to surgery STOP taking any diclofenac sodium (VOLTAREN), Aspirin(unless otherwise instructed by your surgeon), Aleve, Naproxen, Ibuprofen, Motrin, Advil, Goody's, BC's, all herbal medications, fish oil, and all vitamins   WHAT DO I DO ABOUT MY DIABETES MEDICATION?   Marland Kitchen. Do not take oral diabetes medicines (pills) the morning of surgery: metFORMIN (GLUCOPHAGE).  . THE NIGHT BEFORE SURGERY, take 12 units of Insulin Glargine [facility](BASAGLAR KWIKPEN).      . THE MORNING OF SURGERY, Do not take any Humalog.  . The day of surgery, do not take other diabetes injectables, including Byetta (exenatide), Bydureon (exenatide ER), Victoza (liraglutide), or Trulicity (dulaglutide).  . If your CBG is greater than 220 mg/dL, you may take  of your sliding scale (correction) dose of insulin.   How to Manage Your Diabetes Before and After Surgery  Why is it important to control my blood sugar before and  after surgery? . Improving blood sugar levels before and after surgery helps healing and can limit problems. . A way of improving blood sugar control is eating a healthy diet by: o  Eating less sugar and carbohydrates o  Increasing activity/exercise o  Talking with your doctor about reaching your blood sugar goals . High blood sugars (greater than 180 mg/dL) can raise your risk of infections and slow your recovery, so you will need to focus on controlling your diabetes during the weeks before surgery. . Make sure that the doctor who takes care of your diabetes knows about your planned surgery including the date and location.  How do I manage my blood sugar before surgery? . Check your blood sugar at least 4 times a day, starting 2 days before surgery, to make sure that the level is not too high or low. o Check your blood sugar the morning of your surgery when you wake up and every 2 hours until you get to the Short Stay unit. . If your blood sugar is less than 70 mg/dL, you will need to treat for low blood sugar: o Do not take insulin. o Treat a low blood sugar (less than 70 mg/dL) with  cup of clear juice (cranberry or apple), 4 glucose tablets, OR glucose gel. o Recheck blood sugar in 15 minutes after treatment (to make sure it is greater than 70 mg/dL). If your blood sugar is not greater than 70 mg/dL on recheck, call [phone] for further instructions. . Report your blood sugar to the short stay nurse when  you get to Short Stay.  . If you are admitted to the hospital after surgery: o Your blood sugar will be checked by the staff and you will probably be given insulin after surgery (instead of oral diabetes medicines) to make sure you have good blood sugar levels. o The goal for blood sugar control after surgery is 80-180 mg/dL.    Do not wear jewelry, make-up or nail polish.  Do not wear lotions, powders, or perfumes, or deodorant.  Do not shave 48 hours prior to surgery.  Men may  shave face and neck.  Do not bring valuables to the hospital.  [facility] is not responsible for any belongings or valuables.  Contacts, dentures or bridgework may not be worn into surgery.  Leave your suitcase in the car.  After surgery it may be brought to your room.  For patients admitted to the hospital, discharge time will be determined by your treatment team.  Patients discharged the day of surgery will not be allowed to drive home.    [place]- Preparing For Surgery  Before surgery, you can play an important role. Because skin is not sterile, your skin needs to be as free of germs as possible. You can reduce the number of germs on your skin by washing with CHG (chlorahexidine gluconate) Soap before surgery.  CHG is an antiseptic cleaner which kills germs and bonds with the skin to continue killing germs even after washing.    Oral Hygiene is also important to reduce your risk of infection.  Remember - BRUSH YOUR TEETH THE MORNING OF SURGERY WITH YOUR REGULAR TOOTHPASTE  Please do not use if you have an allergy to CHG or antibacterial soaps. If your skin becomes reddened/irritated stop using the CHG.  Do not shave (including legs and underarms) for at least 48 hours prior to first CHG shower. It is OK to shave your face.  Please follow these instructions carefully.   1. Shower the NIGHT BEFORE SURGERY and the MORNING OF SURGERY with CHG.   2. If you chose to wash your hair, wash your hair first as usual with your normal shampoo.  3. After you shampoo, rinse your hair and body thoroughly to remove the shampoo.  4. Use CHG as you would any other liquid soap. You can apply CHG directly to the skin and wash gently with a scrungie or a clean washcloth.   5. Apply the CHG Soap to your body ONLY FROM THE NECK DOWN.  Do not use on open wounds or open sores. Avoid contact with your eyes, ears, mouth and genitals (private parts). Wash Face and genitals (private parts)  with your normal  soap.  6. Wash thoroughly, paying special attention to the area where your surgery will be performed.  7. Thoroughly rinse your body with warm water from the neck down.  8. DO NOT shower/wash with your normal soap after using and rinsing off the CHG Soap.  9. Pat yourself dry with a CLEAN TOWEL.  10. Wear CLEAN PAJAMAS to bed the night before surgery, wear comfortable clothes the morning of surgery  11. Place CLEAN SHEETS on your bed the night of your first shower and DO NOT SLEEP WITH PETS.    Day of Surgery:  Do not apply any deodorants/lotions.  Please wear clean clothes to the hospital/surgery center.   Remember to brush your teeth WITH YOUR REGULAR TOOTHPASTE.    Please read over the following fact sheets that you were given.

## 2018-11-12 ENCOUNTER — Ambulatory Visit (HOSPITAL_COMMUNITY)
Admission: RE | Admit: 2018-11-12 | Discharge: 2018-11-12 | Disposition: A | Discharge status: Home / Self Care | Payer: Federal, State, Local not specified - PPO | Source: Ambulatory Visit | Attending: Surgery | Admitting: Surgery

## 2018-11-12 ENCOUNTER — Encounter (HOSPITAL_COMMUNITY)
Admission: RE | Admit: 2018-11-12 | Discharge: 2018-11-12 | Disposition: A | Discharge status: Home / Self Care | Payer: Federal, State, Local not specified - PPO | Source: Ambulatory Visit | Attending: Surgery | Admitting: Surgery

## 2018-11-12 ENCOUNTER — Encounter (HOSPITAL_COMMUNITY): Payer: Self-pay

## 2018-11-12 DIAGNOSIS — I2510 Atherosclerotic heart disease of native coronary artery without angina pectoris: Secondary | ICD-10-CM

## 2018-11-12 DIAGNOSIS — Z01818 Encounter for other preprocedural examination: Secondary | ICD-10-CM | POA: Insufficient documentation

## 2018-11-12 HISTORY — DX: Atherosclerotic heart disease of native coronary artery without angina pectoris: I25.10

## 2018-11-12 HISTORY — DX: Pneumonia, unspecified organism: J18.9

## 2018-11-12 HISTORY — DX: Other specified postprocedural states: Z98.890

## 2018-11-12 HISTORY — DX: Gastro-esophageal reflux disease without esophagitis: K21.9

## 2018-11-12 HISTORY — DX: Personal history of urinary calculi: Z87.442

## 2018-11-12 HISTORY — DX: Other specified postprocedural states: R11.2

## 2018-11-12 HISTORY — DX: Sleep apnea, unspecified: G47.30

## 2018-11-12 HISTORY — DX: Acute myocardial infarction, unspecified: I21.9

## 2018-11-12 LAB — BLOOD GAS, ARTERIAL
Acid-Base Excess: 2.2 mmol/L — ABNORMAL HIGH (ref 0.0–2.0)
Bicarbonate: 26.5 mmol/L (ref 20.0–28.0)
DRAWN BY: 4498211
FIO2: 21
O2 Saturation: 95.9 %
PATIENT TEMPERATURE: 98.6
pCO2 arterial: 43.9 mmHg (ref 32.0–48.0)
pH, Arterial: 7.399 (ref 7.350–7.450)
pO2, Arterial: 81.6 mmHg — ABNORMAL LOW (ref 83.0–108.0)

## 2018-11-12 LAB — URINALYSIS, ROUTINE W REFLEX MICROSCOPIC
BILIRUBIN URINE: NEGATIVE
GLUCOSE, UA: NEGATIVE mg/dL
Hgb urine dipstick: NEGATIVE
Ketones, ur: NEGATIVE mg/dL
NITRITE: NEGATIVE
PH: 5 (ref 5.0–8.0)
Protein, ur: NEGATIVE mg/dL
SPECIFIC GRAVITY, URINE: 1.017 (ref 1.005–1.030)

## 2018-11-12 LAB — PULMONARY FUNCTION TEST
DL/VA % pred: 90 %
DL/VA: 4.57 ml/min/mmHg/L
DLCO COR % PRED: 63 %
DLCO UNC % PRED: 64 %
DLCO UNC: 17.3 ml/min/mmHg
DLCO cor: 17.09 ml/min/mmHg
FEF 25-75 POST: 2.36 L/s
FEF 25-75 PRE: 2.82 L/s
FEF2575-%Change-Post: -16 %
FEF2575-%PRED-POST: 117 %
FEF2575-%Pred-Pre: 139 %
FEV1-%Change-Post: -5 %
FEV1-%Pred-Post: 84 %
FEV1-%Pred-Pre: 89 %
FEV1-Post: 2.09 L
FEV1-Pre: 2.2 L
FEV1FVC-%Change-Post: 5 %
FEV1FVC-%PRED-PRE: 111 %
FEV6-%Change-Post: -8 %
FEV6-%PRED-POST: 73 %
FEV6-%Pred-Pre: 80 %
FEV6-POST: 2.28 L
FEV6-Pre: 2.5 L
FEV6FVC-%PRED-POST: 104 %
FEV6FVC-%Pred-Pre: 104 %
FVC-%CHANGE-POST: -9 %
FVC-%PRED-POST: 72 %
FVC-%Pred-Pre: 80 %
FVC-Post: 2.35 L
FVC-Pre: 2.61 L
POST FEV6/FVC RATIO: 100 %
Post FEV1/FVC ratio: 89 %
Pre FEV1/FVC ratio: 85 %
Pre FEV6/FVC Ratio: 100 %
RV % PRED: 86 %
RV: 1.98 L
TLC % pred: 83 %
TLC: 4.48 L

## 2018-11-12 LAB — CBC
HCT: 44.4 % (ref 36.0–46.0)
Hemoglobin: 13.8 g/dL (ref 12.0–15.0)
MCH: 29.1 pg (ref 26.0–34.0)
MCHC: 31.1 g/dL (ref 30.0–36.0)
MCV: 93.7 fL (ref 80.0–100.0)
NRBC: 0 % (ref 0.0–0.2)
Platelets: 274 10*3/uL (ref 150–400)
RBC: 4.74 MIL/uL (ref 3.87–5.11)
RDW: 12.9 % (ref 11.5–15.5)
WBC: 10 10*3/uL (ref 4.0–10.5)

## 2018-11-12 LAB — COMPREHENSIVE METABOLIC PANEL
ALT: 24 U/L (ref 0–44)
AST: 21 U/L (ref 15–41)
Albumin: 3.7 g/dL (ref 3.5–5.0)
Alkaline Phosphatase: 90 U/L (ref 38–126)
Anion gap: 11 (ref 5–15)
BILIRUBIN TOTAL: 0.4 mg/dL (ref 0.3–1.2)
BUN: 25 mg/dL — AB (ref 8–23)
CO2: 22 mmol/L (ref 22–32)
Calcium: 9.5 mg/dL (ref 8.9–10.3)
Chloride: 102 mmol/L (ref 98–111)
Creatinine, Ser: 0.99 mg/dL (ref 0.44–1.00)
GFR calc Af Amer: >60 mL/min (ref >60)
GFR, EST NON AFRICAN AMERICAN: 58 mL/min — AB (ref >60)
Glucose, Bld: 138 mg/dL — ABNORMAL HIGH (ref 70–99)
POTASSIUM: 4.2 mmol/L (ref 3.5–5.1)
Sodium: 135 mmol/L (ref 135–145)
TOTAL PROTEIN: 6.8 g/dL (ref 6.5–8.1)

## 2018-11-12 LAB — SURGICAL PCR SCREEN
MRSA, PCR: NEGATIVE
STAPHYLOCOCCUS AUREUS: NEGATIVE

## 2018-11-12 LAB — TYPE AND SCREEN
ABO/RH(D): B POS
Antibody Screen: NEGATIVE

## 2018-11-12 LAB — APTT: aPTT: 25 seconds (ref 24–36)

## 2018-11-12 LAB — HEMOGLOBIN A1C
HEMOGLOBIN A1C: 8.5 % — AB (ref 4.8–5.6)
Mean Plasma Glucose: 197.25 mg/dL

## 2018-11-12 LAB — GLUCOSE, CAPILLARY: GLUCOSE-CAPILLARY: 118 mg/dL — AB (ref 70–99)

## 2018-11-12 LAB — PROTIME-INR
INR: 1.08
Prothrombin Time: 13.9 seconds (ref 11.4–15.2)

## 2018-11-12 LAB — ABO/RH: ABO/RH(D): B POS

## 2018-11-12 MED ORDER — ALBUTEROL SULFATE (2.5 MG/3ML) 0.083% IN NEBU
2.5000 mg | INHALATION_SOLUTION | Freq: Once | RESPIRATORY_TRACT | Status: AC
  Administered 2018-11-12: 2.5 mg via RESPIRATORY_TRACT

## 2018-11-12 NOTE — Pre-Procedure Instructions (Signed)
Adriana [name]  11/12/2018      [id number] [place]- , [place] - [address] AT [facility] [address] [facility] [address] [place] [id number] Phone: [phone] Fax: [phone]  [facility] [place] - [id number] AT Portal to [facility] [address] [place] [id number] Phone: [phone] Fax: [phone]    Your procedure is scheduled on Monday December 2nd.  [facility].M.  Call this number if you have problems the morning of surgery:  [phone]   Remember:  Do not eat or drink after midnight.    Take these medicines the morning of surgery with A SIP OF WATER   acetaminophen (TYLENOL)  buPROPion (WELLBUTRIN XL)  metoprolol tartrate (LOPRESSOR)    Follow your surgeon's instructions on when to stop/resume your warfarin (COUMADIN) and Aspirin. If no instructions were given to you, call your surgeon's office.    7 days prior to surgery STOP taking any diclofenac sodium (VOLTAREN), Aspirin(unless otherwise instructed by your surgeon), Aleve, Naproxen, Ibuprofen, Motrin, Advil, Goody's, BC's, all herbal medications, fish oil, and all vitamins   WHAT DO I DO ABOUT MY DIABETES MEDICATION?   Marland Kitchen. Do not take oral diabetes medicines (pills) the morning of surgery: metFORMIN (GLUCOPHAGE).  . THE NIGHT BEFORE SURGERY, take 12 units of Insulin Glargine [facility](BASAGLAR KWIKPEN).      . THE MORNING OF SURGERY, Do not take any Humalog.  . The day of surgery, do not take other diabetes injectables, including Byetta (exenatide), Bydureon (exenatide ER), Victoza (liraglutide), or Trulicity (dulaglutide).  . If your CBG is greater than 220 mg/dL, you may take  of your sliding scale (correction) dose of insulin.   How to Manage Your Diabetes Before and After Surgery  Why is it important to control my blood sugar before and after  surgery? . Improving blood sugar levels before and after surgery helps healing and can limit problems. . A way of improving blood sugar control is eating a healthy diet by: o  Eating less sugar and carbohydrates o  Increasing activity/exercise o  Talking with your doctor about reaching your blood sugar goals . High blood sugars (greater than 180 mg/dL) can raise your risk of infections and slow your recovery, so you will need to focus on controlling your diabetes during the weeks before surgery. . Make sure that the doctor who takes care of your diabetes knows about your planned surgery including the date and location.  How do I manage my blood sugar before surgery? . Check your blood sugar at least 4 times a day, starting 2 days before surgery, to make sure that the level is not too high or low. o Check your blood sugar the morning of your surgery when you wake up and every 2 hours until you get to the Short Stay unit. . If your blood sugar is less than 70 mg/dL, you will need to treat for low blood sugar: o Do not take insulin. o Treat a low blood sugar (less than 70 mg/dL) with  cup of clear juice (cranberry or apple), 4 glucose tablets, OR glucose gel. o Recheck blood sugar in 15 minutes after treatment (to make sure it is greater than 70 mg/dL). If your blood sugar is not greater than 70 mg/dL on recheck, call [phone] for further instructions. . Report your blood sugar to the short stay nurse when you  get to Short Stay.  . If you are admitted to the hospital after surgery: o Your blood sugar will be checked by the staff and you will probably be given insulin after surgery (instead of oral diabetes medicines) to make sure you have good blood sugar levels. o The goal for blood sugar control after surgery is 80-180 mg/dL.    Do not wear jewelry, make-up or nail polish.  Do not wear lotions, powders, or perfumes, or deodorant.  Do not shave 48 hours prior to surgery.  Men may shave  face and neck.  Do not bring valuables to the hospital.  [facility] is not responsible for any belongings or valuables.  Contacts, dentures or bridgework may not be worn into surgery.  Leave your suitcase in the car.  After surgery it may be brought to your room.  For patients admitted to the hospital, discharge time will be determined by your treatment team.  Patients discharged the day of surgery will not be allowed to drive home.    [place]- Preparing For Surgery  Before surgery, you can play an important role. Because skin is not sterile, your skin needs to be as free of germs as possible. You can reduce the number of germs on your skin by washing with CHG (chlorahexidine gluconate) Soap before surgery.  CHG is an antiseptic cleaner which kills germs and bonds with the skin to continue killing germs even after washing.    Oral Hygiene is also important to reduce your risk of infection.  Remember - BRUSH YOUR TEETH THE MORNING OF SURGERY WITH YOUR REGULAR TOOTHPASTE  Please do not use if you have an allergy to CHG or antibacterial soaps. If your skin becomes reddened/irritated stop using the CHG.  Do not shave (including legs and underarms) for at least 48 hours prior to first CHG shower. It is OK to shave your face.  Please follow these instructions carefully.   1. Shower the NIGHT BEFORE SURGERY and the MORNING OF SURGERY with CHG.   2. If you chose to wash your hair, wash your hair first as usual with your normal shampoo.  3. After you shampoo, rinse your hair and body thoroughly to remove the shampoo.  4. Use CHG as you would any other liquid soap. You can apply CHG directly to the skin and wash gently with a scrungie or a clean washcloth.   5. Apply the CHG Soap to your body ONLY FROM THE NECK DOWN.  Do not use on open wounds or open sores. Avoid contact with your eyes, ears, mouth and genitals (private parts). Wash Face and genitals (private parts)  with your normal  soap.  6. Wash thoroughly, paying special attention to the area where your surgery will be performed.  7. Thoroughly rinse your body with warm water from the neck down.  8. DO NOT shower/wash with your normal soap after using and rinsing off the CHG Soap.  9. Pat yourself dry with a CLEAN TOWEL.  10. Wear CLEAN PAJAMAS to bed the night before surgery, wear comfortable clothes the morning of surgery  11. Place CLEAN SHEETS on your bed the night of your first shower and DO NOT SLEEP WITH PETS.    Day of Surgery:  Do not apply any deodorants/lotions.  Please wear clean clothes to the hospital/surgery center.   Remember to brush your teeth WITH YOUR REGULAR TOOTHPASTE.    Please read over the following fact sheets that you were given.

## 2018-11-12 NOTE — Progress Notes (Signed)
PCP: Dr. Adrian PrinceStephen South Cardiologist: cooper  Fasting sugars 110-130  Pt. Instructed : last dose coumadin 11/12/18 and last dose metformin 11/14/18  Per dr. Laneta SimmersBartle

## 2018-11-12 NOTE — Progress Notes (Addendum)
Pre-CABG Surgery Evaluation completed.  Carotid Findings:    Right Carotid:Velocities in the right ICA are consistent with a 40-59% stenosis.  Left Carotid: Velocities in the left ICA are consistent with a 1-39% stenosis.   Vertebrals:Bilateral vertebral arteries demonstrate antegrade flow.   Upper Extremity Right Left  Brachial Pressures 127 130  Radial Waveforms Monophasic Biphasic  Ulnar Waveforms Triphasic Triphasic  Palmar Arch (Allen's Test) See below See below   Findings:   Right Upper Extremity: Doppler waveforms remain within normal limits with right radial compression. Doppler waveform obliterate with right ulnar compression.  Left Upper Extremity: Doppler waveforms remain within normal limits with left radial compression. Doppler waveform obliterate with left ulnar compression.   Lower  Extremity Right Left  Dorsalis Pedis 119 117  Posterior Tibial 133 106  Ankle/Brachial Indices 1.02 0.90   Findings:  Right ABI: Resting right ankle-brachial index is within normal range. No evidence of significant right lower extremity arterial disease.   Left ABI: Resting left ankle-brachial index indicates Mild left lower extremity arterial disease.   Incidental findings: A 1.19x1.00x1.40cm heterogenous thyroid lesion seen at supper pole of right thyroid gland; A 1.32x1.43x1.63cm heterogenous thyroid lesion at mid pole of left thyroid gland.   Adriana [name] H Debroah Shuttleworth(RDMS RVT) 11/12/18 12:25 PM

## 2018-11-14 ENCOUNTER — Other Ambulatory Visit (HOSPITAL_COMMUNITY): Payer: Self-pay | Admitting: Surgical

## 2018-11-14 MED ORDER — SULFAMETHOXAZOLE-TRIMETHOPRIM 800-160 MG PO TABS: 1.0000 | ORAL_TABLET | Freq: Two times a day (BID) | ORAL | 0 refills | Status: DC

## 2018-11-14 NOTE — Progress Notes (Signed)
Anesthesia Chart Review:  Pt is a 72 year old female scheduled for CABG 11/18/18 with Evelene CroonBryan Bartle, MD.    Please see Dr. Sharee PimpleBartle's progress note 11/07/18 for complete history and prior testing results.    Labs from pre-admission testing reviewed and acceptable for surgery, except UA showed leukocytes and many bacteria.  I notified Gershon CraneWayne Gold, PA in Dr. Sharee PimpleBartle's office.    Doppler pre-CABG 11/12/18:  - Right Carotid: Velocities in the right ICA are consistent with a 40-59% stenosis. - Left Carotid: Velocities in the left ICA are consistent with a 1-39% stenosis. - Vertebrals: Bilateral vertebral arteries demonstrate antegrade flow. - Right ABI: Resting right ankle-brachial index is within normal range. No evidence of significant right lower extremity arterial disease. - Left ABI: Resting left ankle-brachial index indicates mild left lower extremity arterial disease. - Right Upper Extremity: Doppler waveforms remain within normal limits with right radial compression. Doppler waveform obliterate with right ulnar compression. - Left Upper Extremity: Doppler waveforms remain within normal limits with left radial compression. Doppler waveform obliterate with left ulnar compression.   If no changes, I anticipate pt can proceed with surgery as scheduled.   Rica Mastngela Labaron Digirolamo, FNP-BC [facility] Phone: [phone] 11/14/2018 11:45 AM

## 2018-11-16 MED ORDER — MAGNESIUM SULFATE 50 % IJ SOLN
40.0000 meq | INTRAMUSCULAR | Status: DC
  Filled 2018-11-16: qty 9.85

## 2018-11-16 MED ORDER — EPINEPHRINE PF 1 MG/ML IJ SOLN
0.0000 ug/min | INTRAVENOUS | Status: DC
  Filled 2018-11-16: qty 4

## 2018-11-16 MED ORDER — NITROGLYCERIN IN D5W 200-5 MCG/ML-% IV SOLN
2.0000 ug/min | INTRAVENOUS | Status: AC
  Administered 2018-11-17: 16.6 ug/min via INTRAVENOUS
  Filled 2018-11-16: qty 250

## 2018-11-16 MED ORDER — VANCOMYCIN HCL 10 G IV SOLR
1250.0000 mg | INTRAVENOUS | Status: DC
  Filled 2018-11-16: qty 1250

## 2018-11-16 MED ORDER — INSULIN REGULAR(HUMAN) IN NACL 100-0.9 UT/100ML-% IV SOLN
INTRAVENOUS | Status: DC
  Filled 2018-11-16: qty 100

## 2018-11-16 MED ORDER — PLASMA-LYTE 148 IV SOLN
INTRAVENOUS | Status: DC
  Filled 2018-11-16: qty 2.5

## 2018-11-16 MED ORDER — DOPAMINE-DEXTROSE 3.2-5 MG/ML-% IV SOLN
0.0000 ug/kg/min | INTRAVENOUS | Status: DC
  Filled 2018-11-16: qty 250

## 2018-11-16 MED ORDER — SODIUM CHLORIDE 0.9 % IV SOLN
750.0000 mg | INTRAVENOUS | Status: AC
  Administered 2018-11-17: .75 g via INTRAVENOUS
  Filled 2018-11-16: qty 750

## 2018-11-16 MED ORDER — SODIUM CHLORIDE 0.9 % IV SOLN
INTRAVENOUS | Status: DC
  Filled 2018-11-16: qty 30

## 2018-11-16 MED ORDER — PHENYLEPHRINE HCL-NACL 20-0.9 MG/250ML-% IV SOLN
30.0000 ug/min | INTRAVENOUS | Status: DC
  Filled 2018-11-16: qty 250

## 2018-11-16 MED ORDER — POTASSIUM CHLORIDE 2 MEQ/ML IV SOLN
80.0000 meq | INTRAVENOUS | Status: DC
  Filled 2018-11-16: qty 40

## 2018-11-16 MED ORDER — TRANEXAMIC ACID 1000 MG/10ML IV SOLN
1.5000 mg/kg/h | INTRAVENOUS | Status: DC
  Filled 2018-11-16: qty 25

## 2018-11-16 MED ORDER — MILRINONE LACTATE IN DEXTROSE 20-5 MG/100ML-% IV SOLN
0.3000 ug/kg/min | INTRAVENOUS | Status: DC
  Filled 2018-11-16: qty 100

## 2018-11-16 MED ORDER — DEXMEDETOMIDINE HCL IN NACL 400 MCG/100ML IV SOLN
0.1000 ug/kg/h | INTRAVENOUS | Status: DC
  Filled 2018-11-16: qty 100

## 2018-11-16 MED ORDER — SODIUM CHLORIDE 0.9 % IV SOLN
1.5000 g | INTRAVENOUS | Status: AC
  Administered 2018-11-17: 1.5 g via INTRAVENOUS
  Filled 2018-11-16: qty 1.5

## 2018-11-16 MED ORDER — TRANEXAMIC ACID (OHS) PUMP PRIME SOLUTION
2.0000 mg/kg | INTRAVENOUS | Status: DC
  Filled 2018-11-16: qty 1.4

## 2018-11-16 MED ORDER — TRANEXAMIC ACID (OHS) BOLUS VIA INFUSION
15.0000 mg/kg | INTRAVENOUS | Status: AC
  Administered 2018-11-17: 1047 mg via INTRAVENOUS
  Filled 2018-11-16: qty 1047

## 2018-11-17 ENCOUNTER — Encounter (HOSPITAL_COMMUNITY): Payer: Self-pay

## 2018-11-17 ENCOUNTER — Inpatient Hospital Stay (HOSPITAL_COMMUNITY): Payer: Medicare Other | Admitting: Emergency Medicine

## 2018-11-17 ENCOUNTER — Inpatient Hospital Stay (HOSPITAL_COMMUNITY)
Admission: RE | Disposition: A | Discharge status: Home / Self Care | Payer: Self-pay | Source: Home / Self Care | Attending: Surgery

## 2018-11-17 ENCOUNTER — Inpatient Hospital Stay (HOSPITAL_COMMUNITY): Payer: Medicare Other

## 2018-11-17 ENCOUNTER — Inpatient Hospital Stay (HOSPITAL_COMMUNITY)
Admission: RE | Admit: 2018-11-17 | Discharge: 2018-11-24 | DRG: 236 | Disposition: A | Discharge status: Home / Self Care | Payer: Medicare Other | Attending: Surgery | Admitting: Surgery

## 2018-11-17 ENCOUNTER — Inpatient Hospital Stay (HOSPITAL_COMMUNITY): Payer: Medicare Other | Admitting: Certified Registered Nurse Anesthetist

## 2018-11-17 DIAGNOSIS — Z9089 Acquired absence of other organs: Secondary | ICD-10-CM | POA: Diagnosis not present

## 2018-11-17 DIAGNOSIS — F329 Major depressive disorder, single episode, unspecified: Secondary | ICD-10-CM | POA: Diagnosis present

## 2018-11-17 DIAGNOSIS — Z801 Family history of malignant neoplasm of trachea, bronchus and lung: Secondary | ICD-10-CM

## 2018-11-17 DIAGNOSIS — K3184 Gastroparesis: Secondary | ICD-10-CM | POA: Diagnosis present

## 2018-11-17 DIAGNOSIS — I2510 Atherosclerotic heart disease of native coronary artery without angina pectoris: Secondary | ICD-10-CM | POA: Diagnosis not present

## 2018-11-17 DIAGNOSIS — I252 Old myocardial infarction: Secondary | ICD-10-CM | POA: Diagnosis not present

## 2018-11-17 DIAGNOSIS — I119 Hypertensive heart disease without heart failure: Secondary | ICD-10-CM | POA: Diagnosis present

## 2018-11-17 DIAGNOSIS — Q211 Atrial septal defect: Secondary | ICD-10-CM

## 2018-11-17 DIAGNOSIS — D62 Acute posthemorrhagic anemia: Secondary | ICD-10-CM | POA: Diagnosis not present

## 2018-11-17 DIAGNOSIS — Z8619 Personal history of other infectious and parasitic diseases: Secondary | ICD-10-CM | POA: Diagnosis not present

## 2018-11-17 DIAGNOSIS — E1159 Type 2 diabetes mellitus with other circulatory complications: Secondary | ICD-10-CM | POA: Diagnosis not present

## 2018-11-17 DIAGNOSIS — Z9849 Cataract extraction status, unspecified eye: Secondary | ICD-10-CM

## 2018-11-17 DIAGNOSIS — I2582 Chronic total occlusion of coronary artery: Secondary | ICD-10-CM | POA: Diagnosis present

## 2018-11-17 DIAGNOSIS — Z90710 Acquired absence of both cervix and uterus: Secondary | ICD-10-CM | POA: Diagnosis not present

## 2018-11-17 DIAGNOSIS — Z884 Allergy status to anesthetic agent status: Secondary | ICD-10-CM

## 2018-11-17 DIAGNOSIS — Z9049 Acquired absence of other specified parts of digestive tract: Secondary | ICD-10-CM

## 2018-11-17 DIAGNOSIS — D6851 Activated protein C resistance: Secondary | ICD-10-CM | POA: Diagnosis not present

## 2018-11-17 DIAGNOSIS — Z833 Family history of diabetes mellitus: Secondary | ICD-10-CM

## 2018-11-17 DIAGNOSIS — Z83518 Family history of other specified eye disorder: Secondary | ICD-10-CM

## 2018-11-17 DIAGNOSIS — Z791 Long term (current) use of non-steroidal anti-inflammatories (NSAID): Secondary | ICD-10-CM

## 2018-11-17 DIAGNOSIS — Z8673 Personal history of transient ischemic attack (TIA), and cerebral infarction without residual deficits: Secondary | ICD-10-CM | POA: Diagnosis not present

## 2018-11-17 DIAGNOSIS — E785 Hyperlipidemia, unspecified: Secondary | ICD-10-CM | POA: Diagnosis present

## 2018-11-17 DIAGNOSIS — I340 Nonrheumatic mitral (valve) insufficiency: Secondary | ICD-10-CM | POA: Diagnosis present

## 2018-11-17 DIAGNOSIS — Z823 Family history of stroke: Secondary | ICD-10-CM

## 2018-11-17 DIAGNOSIS — E1143 Type 2 diabetes mellitus with diabetic autonomic (poly)neuropathy: Secondary | ICD-10-CM | POA: Diagnosis not present

## 2018-11-17 DIAGNOSIS — Z7901 Long term (current) use of anticoagulants: Secondary | ICD-10-CM

## 2018-11-17 DIAGNOSIS — I4891 Unspecified atrial fibrillation: Secondary | ICD-10-CM | POA: Diagnosis not present

## 2018-11-17 DIAGNOSIS — Z87891 Personal history of nicotine dependence: Secondary | ICD-10-CM | POA: Diagnosis not present

## 2018-11-17 DIAGNOSIS — E8770 Fluid overload, unspecified: Secondary | ICD-10-CM | POA: Diagnosis not present

## 2018-11-17 DIAGNOSIS — J90 Pleural effusion, not elsewhere classified: Secondary | ICD-10-CM

## 2018-11-17 DIAGNOSIS — Z8672 Personal history of thrombophlebitis: Secondary | ICD-10-CM

## 2018-11-17 DIAGNOSIS — Z79899 Other long term (current) drug therapy: Secondary | ICD-10-CM

## 2018-11-17 DIAGNOSIS — Z86718 Personal history of other venous thrombosis and embolism: Secondary | ICD-10-CM | POA: Diagnosis not present

## 2018-11-17 DIAGNOSIS — Z825 Family history of asthma and other chronic lower respiratory diseases: Secondary | ICD-10-CM

## 2018-11-17 DIAGNOSIS — Z886 Allergy status to analgesic agent status: Secondary | ICD-10-CM

## 2018-11-17 DIAGNOSIS — Z794 Long term (current) use of insulin: Secondary | ICD-10-CM

## 2018-11-17 DIAGNOSIS — Z951 Presence of aortocoronary bypass graft: Secondary | ICD-10-CM

## 2018-11-17 HISTORY — PX: CORONARY ARTERY BYPASS GRAFT: SHX141

## 2018-11-17 HISTORY — PX: TEE WITHOUT CARDIOVERSION: SHX5443

## 2018-11-17 LAB — POCT I-STAT, CHEM 8
BUN: 24 mg/dL — ABNORMAL HIGH (ref 8–23)
BUN: 17 mg/dL (ref 8–23)
BUN: 21 mg/dL (ref 8–23)
BUN: 19 mg/dL (ref 8–23)
BUN: 18 mg/dL (ref 8–23)
BUN: 14 mg/dL (ref 8–23)
CREATININE: 1 mg/dL (ref 0.44–1.00)
Calcium, Ion: 1.24 mmol/L (ref 1.15–1.40)
Calcium, Ion: 0.98 mmol/L — ABNORMAL LOW (ref 1.15–1.40)
Calcium, Ion: 1.22 mmol/L (ref 1.15–1.40)
Calcium, Ion: 1.04 mmol/L — ABNORMAL LOW (ref 1.15–1.40)
Calcium, Ion: 1.1 mmol/L — ABNORMAL LOW (ref 1.15–1.40)
Calcium, Ion: 1.12 mmol/L — ABNORMAL LOW (ref 1.15–1.40)
Chloride: 100 mmol/L (ref 98–111)
Chloride: 102 mmol/L (ref 98–111)
Chloride: 98 mmol/L (ref 98–111)
Chloride: 95 mmol/L — ABNORMAL LOW (ref 98–111)
Chloride: 101 mmol/L (ref 98–111)
Chloride: 104 mmol/L (ref 98–111)
Creatinine, Ser: 0.7 mg/dL (ref 0.44–1.00)
Creatinine, Ser: 1 mg/dL (ref 0.44–1.00)
Creatinine, Ser: 0.8 mg/dL (ref 0.44–1.00)
Creatinine, Ser: 0.9 mg/dL (ref 0.44–1.00)
Creatinine, Ser: 0.8 mg/dL (ref 0.44–1.00)
Glucose, Bld: 172 mg/dL — ABNORMAL HIGH (ref 70–99)
Glucose, Bld: 128 mg/dL — ABNORMAL HIGH (ref 70–99)
Glucose, Bld: 144 mg/dL — ABNORMAL HIGH (ref 70–99)
Glucose, Bld: 175 mg/dL — ABNORMAL HIGH (ref 70–99)
Glucose, Bld: 177 mg/dL — ABNORMAL HIGH (ref 70–99)
Glucose, Bld: 153 mg/dL — ABNORMAL HIGH (ref 70–99)
HCT: 34 % — ABNORMAL LOW (ref 36.0–46.0)
HCT: 26 % — ABNORMAL LOW (ref 36.0–46.0)
HCT: 33 % — ABNORMAL LOW (ref 36.0–46.0)
HCT: 23 % — ABNORMAL LOW (ref 36.0–46.0)
HCT: 26 % — ABNORMAL LOW (ref 36.0–46.0)
HCT: 31 % — ABNORMAL LOW (ref 36.0–46.0)
HEMOGLOBIN: 8.8 g/dL — AB (ref 12.0–15.0)
HEMOGLOBIN: 8.8 g/dL — AB (ref 12.0–15.0)
Hemoglobin: 11.6 g/dL — ABNORMAL LOW (ref 12.0–15.0)
Hemoglobin: 11.2 g/dL — ABNORMAL LOW (ref 12.0–15.0)
Hemoglobin: 7.8 g/dL — ABNORMAL LOW (ref 12.0–15.0)
Hemoglobin: 10.5 g/dL — ABNORMAL LOW (ref 12.0–15.0)
Potassium: 4.2 mmol/L (ref 3.5–5.1)
Potassium: 3.4 mmol/L — ABNORMAL LOW (ref 3.5–5.1)
Potassium: 3.8 mmol/L (ref 3.5–5.1)
Potassium: 3.4 mmol/L — ABNORMAL LOW (ref 3.5–5.1)
Potassium: 3.5 mmol/L (ref 3.5–5.1)
Potassium: 4.7 mmol/L (ref 3.5–5.1)
SODIUM: 138 mmol/L (ref 135–145)
SODIUM: 138 mmol/L (ref 135–145)
Sodium: 138 mmol/L (ref 135–145)
Sodium: 134 mmol/L — ABNORMAL LOW (ref 135–145)
Sodium: 139 mmol/L (ref 135–145)
Sodium: 139 mmol/L (ref 135–145)
TCO2: 28 mmol/L (ref 22–32)
TCO2: 26 mmol/L (ref 22–32)
TCO2: 27 mmol/L (ref 22–32)
TCO2: 25 mmol/L (ref 22–32)
TCO2: 23 mmol/L (ref 22–32)
TCO2: 25 mmol/L (ref 22–32)

## 2018-11-17 LAB — POCT I-STAT 3, ART BLOOD GAS (G3+)
Acid-Base Excess: 1 mmol/L (ref 0.0–2.0)
Acid-base deficit: 3 mmol/L — ABNORMAL HIGH (ref 0.0–2.0)
Acid-base deficit: 5 mmol/L — ABNORMAL HIGH (ref 0.0–2.0)
Acid-base deficit: 2 mmol/L (ref 0.0–2.0)
Bicarbonate: 25 mmol/L (ref 20.0–28.0)
Bicarbonate: 21.8 mmol/L (ref 20.0–28.0)
Bicarbonate: 21.9 mmol/L (ref 20.0–28.0)
Bicarbonate: 24.2 mmol/L (ref 20.0–28.0)
O2 Saturation: 100 %
O2 Saturation: 94 %
O2 Saturation: 98 %
O2 Saturation: 98 %
Patient temperature: 35.9
Patient temperature: 36.9
Patient temperature: 36.9
TCO2: 26 mmol/L (ref 22–32)
TCO2: 23 mmol/L (ref 22–32)
TCO2: 23 mmol/L (ref 22–32)
TCO2: 26 mmol/L (ref 22–32)
pCO2 arterial: 38.5 mmHg (ref 32.0–48.0)
pCO2 arterial: 36.7 mmHg (ref 32.0–48.0)
pCO2 arterial: 47.5 mmHg (ref 32.0–48.0)
pCO2 arterial: 48.1 mmHg — ABNORMAL HIGH (ref 32.0–48.0)
pH, Arterial: 7.421 (ref 7.350–7.450)
pH, Arterial: 7.377 (ref 7.350–7.450)
pH, Arterial: 7.271 — ABNORMAL LOW (ref 7.350–7.450)
pH, Arterial: 7.309 — ABNORMAL LOW (ref 7.350–7.450)
pO2, Arterial: 361 mmHg — ABNORMAL HIGH (ref 83.0–108.0)
pO2, Arterial: 69 mmHg — ABNORMAL LOW (ref 83.0–108.0)
pO2, Arterial: 115 mmHg — ABNORMAL HIGH (ref 83.0–108.0)
pO2, Arterial: 124 mmHg — ABNORMAL HIGH (ref 83.0–108.0)

## 2018-11-17 LAB — GLUCOSE, CAPILLARY
GLUCOSE-CAPILLARY: 121 mg/dL — AB (ref 70–99)
Glucose-Capillary: 105 mg/dL — ABNORMAL HIGH (ref 70–99)
Glucose-Capillary: 112 mg/dL — ABNORMAL HIGH (ref 70–99)
Glucose-Capillary: 143 mg/dL — ABNORMAL HIGH (ref 70–99)
Glucose-Capillary: 159 mg/dL — ABNORMAL HIGH (ref 70–99)
Glucose-Capillary: 160 mg/dL — ABNORMAL HIGH (ref 70–99)
Glucose-Capillary: 166 mg/dL — ABNORMAL HIGH (ref 70–99)
Glucose-Capillary: 57 mg/dL — ABNORMAL LOW (ref 70–99)
Glucose-Capillary: 73 mg/dL (ref 70–99)

## 2018-11-17 LAB — CBC
HCT: 28.4 % — ABNORMAL LOW (ref 36.0–46.0)
HEMATOCRIT: 32.9 % — AB (ref 36.0–46.0)
Hemoglobin: 9.3 g/dL — ABNORMAL LOW (ref 12.0–15.0)
Hemoglobin: 10.2 g/dL — ABNORMAL LOW (ref 12.0–15.0)
MCH: 29.8 pg (ref 26.0–34.0)
MCH: 28.7 pg (ref 26.0–34.0)
MCHC: 32.7 g/dL (ref 30.0–36.0)
MCHC: 31 g/dL (ref 30.0–36.0)
MCV: 91 fL (ref 80.0–100.0)
MCV: 92.7 fL (ref 80.0–100.0)
PLATELETS: 129 10*3/uL — AB (ref 150–400)
Platelets: 169 10*3/uL (ref 150–400)
RBC: 3.12 MIL/uL — ABNORMAL LOW (ref 3.87–5.11)
RBC: 3.55 MIL/uL — ABNORMAL LOW (ref 3.87–5.11)
RDW: 12.9 % (ref 11.5–15.5)
RDW: 13 % (ref 11.5–15.5)
WBC: 12.9 10*3/uL — AB (ref 4.0–10.5)
WBC: 11.8 10*3/uL — ABNORMAL HIGH (ref 4.0–10.5)
nRBC: 0 % (ref 0.0–0.2)
nRBC: 0 % (ref 0.0–0.2)

## 2018-11-17 LAB — POCT I-STAT 4, (NA,K, GLUC, HGB,HCT)
Glucose, Bld: 125 mg/dL — ABNORMAL HIGH (ref 70–99)
HCT: 28 % — ABNORMAL LOW (ref 36.0–46.0)
Hemoglobin: 9.5 g/dL — ABNORMAL LOW (ref 12.0–15.0)
Potassium: 3.5 mmol/L (ref 3.5–5.1)
Sodium: 140 mmol/L (ref 135–145)

## 2018-11-17 LAB — HEMOGLOBIN AND HEMATOCRIT, BLOOD
HEMATOCRIT: 25 % — AB (ref 36.0–46.0)
Hemoglobin: 8.2 g/dL — ABNORMAL LOW (ref 12.0–15.0)

## 2018-11-17 LAB — PROTIME-INR
INR: 1.07
INR: 1.7
PROTHROMBIN TIME: 19.8 s — AB (ref 11.4–15.2)
Prothrombin Time: 13.8 seconds (ref 11.4–15.2)

## 2018-11-17 LAB — APTT: aPTT: 27 seconds (ref 24–36)

## 2018-11-17 LAB — CREATININE, SERUM
Creatinine, Ser: 0.92 mg/dL (ref 0.44–1.00)
GFR calc Af Amer: >60 mL/min (ref >60)
GFR calc non Af Amer: >60 mL/min (ref >60)

## 2018-11-17 LAB — MAGNESIUM: Magnesium: 3.2 mg/dL — ABNORMAL HIGH (ref 1.7–2.4)

## 2018-11-17 LAB — PLATELET COUNT: Platelets: 154 10*3/uL (ref 150–400)

## 2018-11-17 SURGERY — CORONARY ARTERY BYPASS GRAFTING (CABG)
Anesthesia: General | Site: Chest

## 2018-11-17 MED ORDER — CHLORHEXIDINE GLUCONATE 0.12 % MT SOLN
15.0000 mL | OROMUCOSAL | Status: AC
  Administered 2018-11-17: 15 mL via OROMUCOSAL
  Filled 2018-11-17: qty 15

## 2018-11-17 MED ORDER — METOPROLOL TARTRATE 5 MG/5ML IV SOLN: 2.5000 mg | INTRAVENOUS | Status: DC | PRN

## 2018-11-17 MED ORDER — PHENYLEPHRINE HCL-NACL 20-0.9 MG/250ML-% IV SOLN: 0.0000 ug/min | INTRAVENOUS | Status: DC

## 2018-11-17 MED ORDER — LACTATED RINGERS IV SOLN
INTRAVENOUS | Status: DC | PRN
  Administered 2018-11-17 (×2): via INTRAVENOUS

## 2018-11-17 MED ORDER — PROTAMINE SULFATE 10 MG/ML IV SOLN
INTRAVENOUS | Status: AC
  Filled 2018-11-17: qty 25

## 2018-11-17 MED ORDER — VANCOMYCIN HCL IN DEXTROSE 1-5 GM/200ML-% IV SOLN
1000.0000 mg | Freq: Once | INTRAVENOUS | Status: AC
  Administered 2018-11-17: 1000 mg via INTRAVENOUS
  Filled 2018-11-17: qty 200

## 2018-11-17 MED ORDER — BUPROPION HCL ER (XL) 300 MG PO TB24
300.0000 mg | ORAL_TABLET | Freq: Every day | ORAL | Status: DC
  Administered 2018-11-18 – 2018-11-24 (×7): 300 mg via ORAL
  Filled 2018-11-17: qty 1
  Filled 2018-11-17: qty 2
  Filled 2018-11-17: qty 1
  Filled 2018-11-17 (×2): qty 2
  Filled 2018-11-17 (×2): qty 1

## 2018-11-17 MED ORDER — DOCUSATE SODIUM 100 MG PO CAPS
200.0000 mg | ORAL_CAPSULE | Freq: Every day | ORAL | Status: DC
  Administered 2018-11-18 – 2018-11-19 (×2): 200 mg via ORAL
  Filled 2018-11-17 (×3): qty 2

## 2018-11-17 MED ORDER — PANTOPRAZOLE SODIUM 40 MG PO TBEC
40.0000 mg | DELAYED_RELEASE_TABLET | Freq: Every day | ORAL | Status: DC
  Administered 2018-11-19: 40 mg via ORAL
  Filled 2018-11-17 (×2): qty 1

## 2018-11-17 MED ORDER — VANCOMYCIN HCL 1000 MG IV SOLR
INTRAVENOUS | Status: DC | PRN
  Administered 2018-11-17: 1250 mg via INTRAVENOUS

## 2018-11-17 MED ORDER — THROMBIN 20000 UNITS EX SOLR
OROMUCOSAL | Status: DC | PRN
  Administered 2018-11-17 (×3): 4 mL via TOPICAL

## 2018-11-17 MED ORDER — THROMBIN (RECOMBINANT) 20000 UNITS EX SOLR
CUTANEOUS | Status: AC
  Filled 2018-11-17: qty 20000

## 2018-11-17 MED ORDER — INSULIN REGULAR(HUMAN) IN NACL 100-0.9 UT/100ML-% IV SOLN: INTRAVENOUS | Status: DC

## 2018-11-17 MED ORDER — PROTAMINE SULFATE 10 MG/ML IV SOLN
INTRAVENOUS | Status: DC | PRN
  Administered 2018-11-17: 20 mg via INTRAVENOUS
  Administered 2018-11-17: 230 mg via INTRAVENOUS

## 2018-11-17 MED ORDER — PROPOFOL 500 MG/50ML IV EMUL
INTRAVENOUS | Status: DC | PRN
  Administered 2018-11-17: 25 ug/kg/min via INTRAVENOUS

## 2018-11-17 MED ORDER — METOPROLOL TARTRATE 12.5 MG HALF TABLET
12.5000 mg | ORAL_TABLET | Freq: Two times a day (BID) | ORAL | Status: DC
  Administered 2018-11-18 – 2018-11-19 (×3): 12.5 mg via ORAL
  Filled 2018-11-17 (×3): qty 1

## 2018-11-17 MED ORDER — LACTATED RINGERS IV SOLN: INTRAVENOUS | Status: DC

## 2018-11-17 MED ORDER — NITROGLYCERIN IN D5W 200-5 MCG/ML-% IV SOLN: 0.0000 ug/min | INTRAVENOUS | Status: DC

## 2018-11-17 MED ORDER — ROCURONIUM BROMIDE 10 MG/ML (PF) SYRINGE
PREFILLED_SYRINGE | INTRAVENOUS | Status: DC | PRN
  Administered 2018-11-17: 70 mg via INTRAVENOUS
  Administered 2018-11-17 (×2): 50 mg via INTRAVENOUS

## 2018-11-17 MED ORDER — ACETAMINOPHEN 160 MG/5ML PO SOLN: 1000.0000 mg | Freq: Four times a day (QID) | ORAL | Status: DC

## 2018-11-17 MED ORDER — SODIUM BICARBONATE 8.4 % IV SOLN
50.0000 meq | Freq: Once | INTRAVENOUS | Status: AC
  Administered 2018-11-17: 50 meq via INTRAVENOUS

## 2018-11-17 MED ORDER — PHENYLEPHRINE HCL 10 MG/ML IJ SOLN
INTRAMUSCULAR | Status: DC | PRN
  Administered 2018-11-17 (×5): 40 ug via INTRAVENOUS

## 2018-11-17 MED ORDER — ROCURONIUM BROMIDE 50 MG/5ML IV SOSY
PREFILLED_SYRINGE | INTRAVENOUS | Status: AC
  Filled 2018-11-17: qty 10

## 2018-11-17 MED ORDER — MIDAZOLAM HCL (PF) 10 MG/2ML IJ SOLN
INTRAMUSCULAR | Status: AC
  Filled 2018-11-17: qty 2

## 2018-11-17 MED ORDER — DEXMEDETOMIDINE HCL IN NACL 200 MCG/50ML IV SOLN: 0.0000 ug/kg/h | INTRAVENOUS | Status: DC

## 2018-11-17 MED ORDER — CHLORHEXIDINE GLUCONATE 0.12 % MT SOLN
15.0000 mL | Freq: Once | OROMUCOSAL | Status: AC
  Administered 2018-11-17: 15 mL via OROMUCOSAL
  Filled 2018-11-17: qty 15

## 2018-11-17 MED ORDER — SODIUM CHLORIDE 0.9 % IV SOLN
INTRAVENOUS | Status: DC | PRN
  Administered 2018-11-17: 40 ug/min via INTRAVENOUS

## 2018-11-17 MED ORDER — PROPOFOL 10 MG/ML IV BOLUS
INTRAVENOUS | Status: DC | PRN
  Administered 2018-11-17 (×2): 20 mg via INTRAVENOUS
  Administered 2018-11-17: 40 mg via INTRAVENOUS

## 2018-11-17 MED ORDER — METOPROLOL TARTRATE 12.5 MG HALF TABLET
12.5000 mg | ORAL_TABLET | Freq: Once | ORAL | Status: DC
  Filled 2018-11-17: qty 1

## 2018-11-17 MED ORDER — LACTATED RINGERS IV SOLN
INTRAVENOUS | Status: DC | PRN
  Administered 2018-11-17: 08:00:00 via INTRAVENOUS

## 2018-11-17 MED ORDER — SIMVASTATIN 20 MG PO TABS
40.0000 mg | ORAL_TABLET | Freq: Every day | ORAL | Status: DC
  Administered 2018-11-18 – 2018-11-21 (×4): 40 mg via ORAL
  Filled 2018-11-17 (×4): qty 2
  Filled 2018-11-17: qty 1

## 2018-11-17 MED ORDER — SODIUM CHLORIDE (PF) 0.9 % IJ SOLN
INTRAMUSCULAR | Status: AC
  Filled 2018-11-17: qty 10

## 2018-11-17 MED ORDER — ONDANSETRON HCL 4 MG/2ML IJ SOLN
INTRAMUSCULAR | Status: DC | PRN
  Administered 2018-11-17: 4 mg via INTRAVENOUS

## 2018-11-17 MED ORDER — OXYCODONE HCL 5 MG PO TABS
5.0000 mg | ORAL_TABLET | ORAL | Status: DC | PRN
  Administered 2018-11-17 – 2018-11-19 (×8): 10 mg via ORAL
  Filled 2018-11-17 (×8): qty 2

## 2018-11-17 MED ORDER — SODIUM CHLORIDE 0.9 % IV SOLN
1.5000 g | Freq: Two times a day (BID) | INTRAVENOUS | Status: AC
  Administered 2018-11-17 – 2018-11-19 (×4): 1.5 g via INTRAVENOUS
  Filled 2018-11-17 (×4): qty 1.5

## 2018-11-17 MED ORDER — THROMBIN 20000 UNITS EX SOLR
CUTANEOUS | Status: DC | PRN
  Administered 2018-11-17: 20000 [IU] via TOPICAL

## 2018-11-17 MED ORDER — ALBUMIN HUMAN 5 % IV SOLN
250.0000 mL | INTRAVENOUS | Status: DC | PRN
  Administered 2018-11-17: 12.5 g via INTRAVENOUS

## 2018-11-17 MED ORDER — ACETAMINOPHEN 650 MG RE SUPP
650.0000 mg | Freq: Once | RECTAL | Status: AC
  Administered 2018-11-17: 650 mg via RECTAL

## 2018-11-17 MED ORDER — INSULIN REGULAR BOLUS VIA INFUSION
0.0000 [IU] | Freq: Three times a day (TID) | INTRAVENOUS | Status: DC
  Filled 2018-11-17: qty 10

## 2018-11-17 MED ORDER — FAMOTIDINE IN NACL 20-0.9 MG/50ML-% IV SOLN
20.0000 mg | Freq: Two times a day (BID) | INTRAVENOUS | Status: AC
  Administered 2018-11-17: 20 mg via INTRAVENOUS
  Filled 2018-11-17: qty 50

## 2018-11-17 MED ORDER — TRAMADOL HCL 50 MG PO TABS
50.0000 mg | ORAL_TABLET | ORAL | Status: DC | PRN
  Administered 2018-11-17 – 2018-11-18 (×3): 100 mg via ORAL
  Filled 2018-11-17 (×3): qty 2

## 2018-11-17 MED ORDER — ALBUMIN HUMAN 5 % IV SOLN
INTRAVENOUS | Status: DC | PRN
  Administered 2018-11-17 (×2): via INTRAVENOUS

## 2018-11-17 MED ORDER — FENTANYL CITRATE (PF) 250 MCG/5ML IJ SOLN
INTRAMUSCULAR | Status: DC | PRN
  Administered 2018-11-17: 150 ug via INTRAVENOUS
  Administered 2018-11-17: 250 ug via INTRAVENOUS
  Administered 2018-11-17: 100 ug via INTRAVENOUS
  Administered 2018-11-17 (×2): 50 ug via INTRAVENOUS
  Administered 2018-11-17: 100 ug via INTRAVENOUS
  Administered 2018-11-17 (×2): 150 ug via INTRAVENOUS

## 2018-11-17 MED ORDER — POTASSIUM CHLORIDE 10 MEQ/50ML IV SOLN
10.0000 meq | INTRAVENOUS | Status: AC
  Administered 2018-11-17 (×3): 10 meq via INTRAVENOUS

## 2018-11-17 MED ORDER — ONDANSETRON HCL 4 MG/2ML IJ SOLN
INTRAMUSCULAR | Status: AC
  Filled 2018-11-17: qty 2

## 2018-11-17 MED ORDER — SODIUM CHLORIDE 0.9% FLUSH: 3.0000 mL | INTRAVENOUS | Status: DC | PRN

## 2018-11-17 MED ORDER — PROPOFOL 10 MG/ML IV BOLUS
INTRAVENOUS | Status: AC
  Filled 2018-11-17: qty 20

## 2018-11-17 MED ORDER — HEPARIN SODIUM (PORCINE) 1000 UNIT/ML IJ SOLN
INTRAMUSCULAR | Status: DC | PRN
  Administered 2018-11-17: 25000 [IU] via INTRAVENOUS
  Administered 2018-11-17: 5000 [IU] via INTRAVENOUS

## 2018-11-17 MED ORDER — HEPARIN SODIUM (PORCINE) 1000 UNIT/ML IJ SOLN
INTRAMUSCULAR | Status: AC
  Filled 2018-11-17: qty 1

## 2018-11-17 MED ORDER — BISACODYL 10 MG RE SUPP: 10.0000 mg | Freq: Every day | RECTAL | Status: DC

## 2018-11-17 MED ORDER — LACTATED RINGERS IV SOLN
INTRAVENOUS | Status: DC
  Administered 2018-11-17: 20 mL/h via INTRAVENOUS

## 2018-11-17 MED ORDER — ARTIFICIAL TEARS OPHTHALMIC OINT
TOPICAL_OINTMENT | OPHTHALMIC | Status: DC | PRN
  Administered 2018-11-17: 1 via OPHTHALMIC

## 2018-11-17 MED ORDER — SODIUM CHLORIDE 0.9 % IV SOLN: 250.0000 mL | INTRAVENOUS | Status: DC

## 2018-11-17 MED ORDER — ONDANSETRON HCL 4 MG/2ML IJ SOLN
4.0000 mg | Freq: Four times a day (QID) | INTRAMUSCULAR | Status: DC | PRN
  Administered 2018-11-17 – 2018-11-20 (×2): 4 mg via INTRAVENOUS
  Filled 2018-11-17 (×2): qty 2

## 2018-11-17 MED ORDER — DEXTROSE 50 % IV SOLN
INTRAVENOUS | Status: AC
  Administered 2018-11-17: 17 mL
  Filled 2018-11-17: qty 50

## 2018-11-17 MED ORDER — SODIUM CHLORIDE 0.45 % IV SOLN
INTRAVENOUS | Status: DC | PRN
  Administered 2018-11-17: 1 mL via INTRAVENOUS

## 2018-11-17 MED ORDER — PHENYLEPHRINE 40 MCG/ML (10ML) SYRINGE FOR IV PUSH (FOR BLOOD PRESSURE SUPPORT)
PREFILLED_SYRINGE | INTRAVENOUS | Status: AC
  Filled 2018-11-17: qty 20

## 2018-11-17 MED ORDER — ACETAMINOPHEN 500 MG PO TABS
1000.0000 mg | ORAL_TABLET | Freq: Four times a day (QID) | ORAL | Status: DC
  Administered 2018-11-17 – 2018-11-19 (×6): 1000 mg via ORAL
  Filled 2018-11-17 (×6): qty 2

## 2018-11-17 MED ORDER — MORPHINE SULFATE (PF) 2 MG/ML IV SOLN
1.0000 mg | INTRAVENOUS | Status: DC | PRN
  Administered 2018-11-17 (×2): 2 mg via INTRAVENOUS
  Administered 2018-11-18: 4 mg via INTRAVENOUS
  Filled 2018-11-17 (×2): qty 1
  Filled 2018-11-17: qty 2

## 2018-11-17 MED ORDER — METOPROLOL TARTRATE 25 MG/10 ML ORAL SUSPENSION: 12.5000 mg | Freq: Two times a day (BID) | ORAL | Status: DC

## 2018-11-17 MED ORDER — BISACODYL 5 MG PO TBEC
10.0000 mg | DELAYED_RELEASE_TABLET | Freq: Every day | ORAL | Status: DC
  Administered 2018-11-18 – 2018-11-19 (×2): 10 mg via ORAL
  Filled 2018-11-17 (×2): qty 2

## 2018-11-17 MED ORDER — CHLORHEXIDINE GLUCONATE 4 % EX LIQD: 30.0000 mL | CUTANEOUS | Status: DC

## 2018-11-17 MED ORDER — SODIUM CHLORIDE 0.9 % IV SOLN
INTRAVENOUS | Status: DC
  Administered 2018-11-17: 20 mL/h via INTRAVENOUS

## 2018-11-17 MED ORDER — LACTATED RINGERS IV SOLN: 500.0000 mL | Freq: Once | INTRAVENOUS | Status: DC | PRN

## 2018-11-17 MED ORDER — 0.9 % SODIUM CHLORIDE (POUR BTL) OPTIME
TOPICAL | Status: DC | PRN
  Administered 2018-11-17: 5000 mL

## 2018-11-17 MED ORDER — MAGNESIUM SULFATE 4 GM/100ML IV SOLN
4.0000 g | Freq: Once | INTRAVENOUS | Status: AC
  Administered 2018-11-17: 4 g via INTRAVENOUS
  Filled 2018-11-17: qty 100

## 2018-11-17 MED ORDER — TRANEXAMIC ACID 1000 MG/10ML IV SOLN
INTRAVENOUS | Status: DC | PRN
  Administered 2018-11-17: 1.5 mg/kg/h via INTRAVENOUS

## 2018-11-17 MED ORDER — LACTATED RINGERS IV SOLN
INTRAVENOUS | Status: DC | PRN
  Administered 2018-11-17: 07:00:00 via INTRAVENOUS

## 2018-11-17 MED ORDER — MIDAZOLAM HCL 2 MG/2ML IJ SOLN: 2.0000 mg | INTRAMUSCULAR | Status: DC | PRN

## 2018-11-17 MED ORDER — PLASMA-LYTE 148 IV SOLN
INTRAVENOUS | Status: DC | PRN
  Administered 2018-11-17: 500 mL via INTRAVASCULAR

## 2018-11-17 MED ORDER — SODIUM CHLORIDE 0.9% FLUSH
3.0000 mL | Freq: Two times a day (BID) | INTRAVENOUS | Status: DC
  Administered 2018-11-18 – 2018-11-19 (×3): 3 mL via INTRAVENOUS

## 2018-11-17 MED ORDER — FENTANYL CITRATE (PF) 250 MCG/5ML IJ SOLN
INTRAMUSCULAR | Status: AC
  Filled 2018-11-17: qty 25

## 2018-11-17 MED ORDER — MIDAZOLAM HCL 5 MG/5ML IJ SOLN
INTRAMUSCULAR | Status: DC | PRN
  Administered 2018-11-17: 2 mg via INTRAVENOUS
  Administered 2018-11-17: 1 mg via INTRAVENOUS
  Administered 2018-11-17: 2 mg via INTRAVENOUS

## 2018-11-17 MED ORDER — ACETAMINOPHEN 160 MG/5ML PO SOLN: 650.0000 mg | Freq: Once | ORAL | Status: AC

## 2018-11-17 MED ORDER — SODIUM CHLORIDE 0.9 % IV SOLN
INTRAVENOUS | Status: DC | PRN
  Administered 2018-11-17: 2.2 [IU]/h via INTRAVENOUS

## 2018-11-17 MED ORDER — HEMOSTATIC AGENTS (NO CHARGE) OPTIME
TOPICAL | Status: DC | PRN
  Administered 2018-11-17: 1 via TOPICAL

## 2018-11-17 MED ORDER — METOCLOPRAMIDE HCL 5 MG/ML IJ SOLN
10.0000 mg | Freq: Four times a day (QID) | INTRAMUSCULAR | Status: DC
  Administered 2018-11-17 – 2018-11-21 (×15): 10 mg via INTRAVENOUS
  Filled 2018-11-17 (×16): qty 2

## 2018-11-17 SURGICAL SUPPLY — 103 items
BAG DECANTER FOR FLEXI CONT (MISCELLANEOUS) ×3 IMPLANT
BANDAGE ACE 4X5 VEL STRL LF (GAUZE/BANDAGES/DRESSINGS) ×3 IMPLANT
BANDAGE ACE 6X5 VEL STRL LF (GAUZE/BANDAGES/DRESSINGS) ×3 IMPLANT
BASKET HEART (ORDER IN 25'S) (MISCELLANEOUS) ×1
BASKET HEART (ORDER IN 25S) (MISCELLANEOUS) ×2 IMPLANT
BLADE STERNUM SYSTEM 6 (BLADE) ×3 IMPLANT
BNDG GAUZE ELAST 4 BULKY (GAUZE/BANDAGES/DRESSINGS) ×3 IMPLANT
CANISTER SUCT 3000ML PPV (MISCELLANEOUS) ×3 IMPLANT
CATH ROBINSON RED A/P 18FR (CATHETERS) ×6 IMPLANT
CATH THORACIC 28FR (CATHETERS) ×3 IMPLANT
CATH THORACIC 36FR (CATHETERS) ×3 IMPLANT
CATH THORACIC 36FR RT ANG (CATHETERS) ×3 IMPLANT
CLIP RETRACTION 3.0MM CORONARY (MISCELLANEOUS) ×3 IMPLANT
CLIP VESOCCLUDE MED 24/CT (CLIP) IMPLANT
CLIP VESOCCLUDE SM WIDE 24/CT (CLIP) ×3 IMPLANT
COVER WAND RF STERILE (DRAPES) ×3 IMPLANT
CRADLE DONUT ADULT HEAD (MISCELLANEOUS) ×3 IMPLANT
DERMABOND ADVANCED (GAUZE/BANDAGES/DRESSINGS) ×1
DERMABOND ADVANCED .7 DNX12 (GAUZE/BANDAGES/DRESSINGS) ×2 IMPLANT
DRAPE CARDIOVASCULAR INCISE (DRAPES) ×1
DRAPE SLUSH/WARMER DISC (DRAPES) ×3 IMPLANT
DRAPE SRG 135X102X78XABS (DRAPES) ×2 IMPLANT
DRSG COVADERM 4X14 (GAUZE/BANDAGES/DRESSINGS) ×3 IMPLANT
ELECT CAUTERY BLADE 6.4 (BLADE) ×3 IMPLANT
ELECT REM PT RETURN 9FT ADLT (ELECTROSURGICAL) ×6
ELECTRODE REM PT RTRN 9FT ADLT (ELECTROSURGICAL) ×4 IMPLANT
FELT TEFLON 1X6 (MISCELLANEOUS) ×3 IMPLANT
GAUZE SPONGE 4X4 12PLY STRL (GAUZE/BANDAGES/DRESSINGS) ×6 IMPLANT
GLOVE BIO SURGEON STRL SZ 6 (GLOVE) IMPLANT
GLOVE BIO SURGEON STRL SZ 6.5 (GLOVE) IMPLANT
GLOVE BIO SURGEON STRL SZ7 (GLOVE) IMPLANT
GLOVE BIO SURGEON STRL SZ7.5 (GLOVE) IMPLANT
GLOVE BIOGEL PI IND STRL 6 (GLOVE) IMPLANT
GLOVE BIOGEL PI IND STRL 6.5 (GLOVE) IMPLANT
GLOVE BIOGEL PI IND STRL 7.0 (GLOVE) IMPLANT
GLOVE BIOGEL PI INDICATOR 6 (GLOVE)
GLOVE BIOGEL PI INDICATOR 6.5 (GLOVE)
GLOVE BIOGEL PI INDICATOR 7.0 (GLOVE)
GLOVE EUDERMIC 7 POWDERFREE (GLOVE) ×6 IMPLANT
GLOVE ORTHO TXT STRL SZ7.5 (GLOVE) IMPLANT
GOWN STRL REUS W/ TWL LRG LVL3 (GOWN DISPOSABLE) ×12 IMPLANT
GOWN STRL REUS W/ TWL XL LVL3 (GOWN DISPOSABLE) ×2 IMPLANT
GOWN STRL REUS W/TWL LRG LVL3 (GOWN DISPOSABLE) ×6
GOWN STRL REUS W/TWL XL LVL3 (GOWN DISPOSABLE) ×1
HEMOSTAT POWDER SURGIFOAM 1G (HEMOSTASIS) ×9 IMPLANT
HEMOSTAT SURGICEL 2X14 (HEMOSTASIS) ×3 IMPLANT
INSERT FOGARTY 61MM (MISCELLANEOUS) IMPLANT
INSERT FOGARTY XLG (MISCELLANEOUS) ×3 IMPLANT
KIT BASIN OR (CUSTOM PROCEDURE TRAY) ×3 IMPLANT
KIT CATH CPB BARTLE (MISCELLANEOUS) ×3 IMPLANT
KIT SUCTION CATH 14FR (SUCTIONS) ×3 IMPLANT
KIT TURNOVER KIT B (KITS) ×3 IMPLANT
KIT VASOVIEW HEMOPRO 2 VH 4000 (KITS) ×3 IMPLANT
NS IRRIG 1000ML POUR BTL (IV SOLUTION) ×15 IMPLANT
PACK E OPEN HEART (SUTURE) ×3 IMPLANT
PACK OPEN HEART (CUSTOM PROCEDURE TRAY) ×3 IMPLANT
PAD ARMBOARD 7.5X6 YLW CONV (MISCELLANEOUS) ×6 IMPLANT
PAD ELECT DEFIB RADIOL ZOLL (MISCELLANEOUS) ×3 IMPLANT
PENCIL BUTTON HOLSTER BLD 10FT (ELECTRODE) ×3 IMPLANT
PUNCH AORTIC ROTATE 4.0MM (MISCELLANEOUS) IMPLANT
PUNCH AORTIC ROTATE 4.5MM 8IN (MISCELLANEOUS) ×3 IMPLANT
PUNCH AORTIC ROTATE 5MM 8IN (MISCELLANEOUS) IMPLANT
SET CARDIOPLEGIA MPS 5001102 (MISCELLANEOUS) ×3 IMPLANT
SOLUTION ANTI FOG 6CC (MISCELLANEOUS) ×3 IMPLANT
SPONGE INTESTINAL PEANUT (DISPOSABLE) IMPLANT
SPONGE LAP 18X18 X RAY DECT (DISPOSABLE) IMPLANT
SPONGE LAP 4X18 RFD (DISPOSABLE) IMPLANT
SUT BONE WAX W31G (SUTURE) ×3 IMPLANT
SUT MNCRL AB 4-0 PS2 18 (SUTURE) ×3 IMPLANT
SUT PROLENE 3 0 SH DA (SUTURE) IMPLANT
SUT PROLENE 3 0 SH1 36 (SUTURE) ×3 IMPLANT
SUT PROLENE 4 0 RB 1 (SUTURE)
SUT PROLENE 4 0 SH DA (SUTURE) IMPLANT
SUT PROLENE 4-0 RB1 .5 CRCL 36 (SUTURE) IMPLANT
SUT PROLENE 5 0 C 1 36 (SUTURE) IMPLANT
SUT PROLENE 6 0 C 1 30 (SUTURE) IMPLANT
SUT PROLENE 7 0 BV 1 (SUTURE) IMPLANT
SUT PROLENE 7 0 BV1 MDA (SUTURE) ×9 IMPLANT
SUT PROLENE 8 0 BV175 6 (SUTURE) IMPLANT
SUT SILK  1 MH (SUTURE)
SUT SILK 1 MH (SUTURE) IMPLANT
SUT SILK 2 0 SH CR/8 (SUTURE) ×3 IMPLANT
SUT STEEL 6MS V (SUTURE) ×6 IMPLANT
SUT STEEL STERNAL CCS#1 18IN (SUTURE) IMPLANT
SUT STEEL SZ 6 DBL 3X14 BALL (SUTURE) IMPLANT
SUT VIC AB 1 CTX 36 (SUTURE) ×2
SUT VIC AB 1 CTX36XBRD ANBCTR (SUTURE) ×4 IMPLANT
SUT VIC AB 2-0 CT1 27 (SUTURE) ×1
SUT VIC AB 2-0 CT1 TAPERPNT 27 (SUTURE) ×2 IMPLANT
SUT VIC AB 2-0 CTX 27 (SUTURE) IMPLANT
SUT VIC AB 3-0 SH 27 (SUTURE)
SUT VIC AB 3-0 SH 27X BRD (SUTURE) IMPLANT
SUT VIC AB 3-0 X1 27 (SUTURE) IMPLANT
SUT VICRYL 4-0 PS2 18IN ABS (SUTURE) IMPLANT
SYSTEM SAHARA CHEST DRAIN ATS (WOUND CARE) ×3 IMPLANT
TAPE CLOTH SURG 4X10 WHT LF (GAUZE/BANDAGES/DRESSINGS) ×3 IMPLANT
TAPE PAPER 2X10 WHT MICROPORE (GAUZE/BANDAGES/DRESSINGS) ×3 IMPLANT
TOWEL GREEN STERILE (TOWEL DISPOSABLE) ×3 IMPLANT
TOWEL GREEN STERILE FF (TOWEL DISPOSABLE) ×3 IMPLANT
TRAY FOLEY SLVR 16FR TEMP STAT (SET/KITS/TRAYS/PACK) ×3 IMPLANT
TUBING INSUFFLATION (TUBING) ×3 IMPLANT
UNDERPAD 30X30 (UNDERPADS AND DIAPERS) ×3 IMPLANT
WATER STERILE IRR 1000ML POUR (IV SOLUTION) ×6 IMPLANT

## 2018-11-17 NOTE — Anesthesia Procedure Notes (Signed)
Central Venous Catheter Insertion Performed by: Val EagleMoser, Soniya Ashraf, MD, anesthesiologist Start/End12/01/2018 7:02 AM, 11/17/2018 7:12 AM Patient location: Pre-op. Preanesthetic checklist: patient identified, IV checked, site marked, risks and benefits discussed, surgical consent, monitors and equipment checked, pre-op evaluation, timeout performed and anesthesia consent Position: supine Lidocaine 1% used for infiltration and patient sedated Hand hygiene performed  and maximum sterile barriers used  Catheter size: 8.5 Fr Total catheter length 10. Sheath introducer Procedure performed using ultrasound guided technique. Ultrasound Notes:anatomy identified, needle tip was noted to be adjacent to the nerve/plexus identified, no ultrasound evidence of intravascular and/or intraneural injection and image(s) printed for medical record Attempts: 1 Following insertion, line sutured, dressing applied and Biopatch. Post procedure assessment: blood return through all ports, free fluid flow and no air  Patient tolerated the procedure well with no immediate complications.

## 2018-11-17 NOTE — Anesthesia Preprocedure Evaluation (Addendum)
Anesthesia Evaluation  Patient identified by MRN, date of birth, ID band Patient awake    Reviewed: Allergy & Precautions, NPO status , Patient's Chart, lab work & pertinent test results  History of Anesthesia Complications (+) PONV and history of anesthetic complications  Airway Mallampati: IV  TM Distance: <3 FB Neck ROM: Full    Dental  (+) Teeth Intact   Pulmonary shortness of breath, sleep apnea , former smoker,    breath sounds clear to auscultation       Cardiovascular hypertension, Pt. on medications and Pt. on home beta blockers + CAD and + Past MI   Rhythm:Regular     Neuro/Psych PSYCHIATRIC DISORDERS Depression CVA, No Residual Symptoms    GI/Hepatic Neg liver ROS,   Endo/Other  diabetes  Renal/GU negative Renal ROS     Musculoskeletal   Abdominal   Peds  Hematology   Anesthesia Other Findings 11/19 cath:  1. 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 Chronic subtotal occlusion of the LAD, filling antegrade but also supplied by right-left collaterals 3. Severe diffuse proximal and mid-LAD and first diagonal stenoses 4. Severe diffuse OM stenosis 5. Mild segmental LV systolic dysfunction  Reproductive/Obstetrics                            Anesthesia Physical Anesthesia Plan  ASA: IV  Anesthesia Plan: General   Post-op Pain Management:    Induction: Intravenous  PONV Risk Score and Plan: 4 or greater and Treatment may vary due to age or medical condition  Airway Management Planned: Oral ETT  Additional Equipment: Arterial line, CVP, PA Cath, TEE and Ultrasound Guidance Line Placement  Intra-op Plan:   Post-operative Plan: Post-operative intubation/ventilation  Informed Consent: I have reviewed the patients History and Physical, chart, labs and discussed the procedure including the risks, benefits and  alternatives for the proposed anesthesia with the patient or authorized representative who has indicated his/her understanding and acceptance.   Dental advisory given  Plan Discussed with: Surgeon and CRNA  Anesthesia Plan Comments:         Anesthesia Quick Evaluation

## 2018-11-17 NOTE — Op Note (Signed)
CARDIOVASCULAR SURGERY OPERATIVE NOTE  11/17/2018  Surgeon:  Alleen Borne, MD  First Assistant: Jari Favre,  PA-C   Preoperative Diagnosis:  Severe multi-vessel coronary artery disease   Postoperative Diagnosis:  Same   Procedure:  1. Median Sternotomy 2. Extracorporeal circulation 3.   Coronary artery bypass grafting x 5   Left internal mammary graft to the LAD  SVG to diagonal  Sequential SVG to OM1 and OM2  SVG to RCA 4.   Endoscopic vein harvest from the left leg   Anesthesia:  General Endotracheal   Clinical History/Surgical Indication:  The patient is a 72 year old woman with a history of poorly controlled type 2 diabetes, hypertension, hyperlipidemia, factor V Leiden mutation with DVT, cerebrovascular disease and stroke status post bilateral carotid endarterectomy, who presents with a history of progressive exertional shortness of breath and fatigue. She was evaluated by Dr. Excell Seltzer and underwent echocardiogram on 10/29/2018. This showed an ejection fraction of 50 to 55% with grade 1 diastolic dysfunction. There was hypokinesis of the mid anteroseptal, mid inferior septal, mid apical inferior, apical septal, and apical myocardium. A nuclear stress test on 10/29/2018 was high risk study showed an ejection fraction of 30 to 44% with no ST segment changes during stress. There is a medium size defect of moderate severity in the basal inferoseptal, mid inferior septal, apical septal, and apical inferior locations. There is a second severe defect of medium size in the mid anterior, mid anterior septal, apical anterior, apical septal, and apical location consistent with prior myocardial infarction with peri-infarct ischemia in the anterior septal, inferoseptal, and apical regions. Cardiac catheterization on 11/04/2018 showed chronic total occlusion of the right coronary artery which was  codominant with left-to-right collaterals from the left circumflex. There is chronic subtotal occlusion of the LAD filling antegrade and also by collaterals from the right. There is severe diffuse proximal and mid LAD disease with stenosis in the first diagonal branch. There is severe diffuse obtuse marginal stenosis. Left ventricular end-diastolic pressure was mildly elevated at 16 mmHg. Left ventricular ejection fraction was 50 to 55%. I agree that coronary bypass graft surgery is the best treatment to prevent further ischemia and infarction and improve her quality of life. I reviewed the cardiac catheterization films with the patient and her family and answered all of their questions. I discussed the operative procedure with the patient and family including alternatives, benefits and risks; including but not limited to bleeding, blood transfusion, infection, stroke, myocardial infarction, graft failure, heart block requiring a permanent pacemaker, organ dysfunction, and death. Adriana [name] understands and agrees to proceed.   Preparation:  The patient was seen in the preoperative holding area and the correct patient, correct operation were confirmed with the patient after reviewing the medical record and catheterization. The consent was signed by me. Preoperative antibiotics were given. A pulmonary arterial line and radial arterial line were placed by the anesthesia team. The patient was taken back to the operating room and positioned supine on the operating room table. After being placed under general endotracheal anesthesia by the anesthesia team a foley catheter was placed. The neck, chest, abdomen, and both legs were prepped with betadine soap and solution and draped in the usual sterile manner. A surgical time-out was taken and the correct patient and operative procedure were confirmed with the nursing and anesthesia staff.   Cardiopulmonary Bypass:  A median sternotomy was performed. The  pericardium was opened in the midline. Right ventricular function appeared normal. The ascending aorta  was of normal size and had no palpable plaque. There were no contraindications to aortic cannulation or cross-clamping. The patient was fully systemically heparinized and the ACT was maintained > 400 sec. The proximal aortic arch was cannulated with a 20 F aortic cannula for arterial inflow. Venous cannulation was performed via the right atrial appendage using a two-staged venous cannula. An antegrade cardioplegia/vent cannula was inserted into the mid-ascending aorta. Aortic occlusion was performed with a single cross-clamp. Systemic cooling to 32 degrees Centigrade and topical cooling of the heart with iced saline were used. Hyperkalemic antegrade cold blood cardioplegia was used to induce diastolic arrest and was then given at about 20 minute intervals throughout the period of arrest to maintain myocardial temperature at or below 10 degrees centigrade. A temperature probe was inserted into the interventricular septum and an insulating pad was placed in the pericardium.   Left internal mammary harvest:  The left side of the sternum was retracted using the Rultract retractor. The left internal mammary artery was harvested as a pedicle graft. All side branches were clipped. It was a medium-sized vessel of good quality with excellent blood flow. It was ligated distally and divided. It was sprayed with topical papaverine solution to prevent vasospasm.   Endoscopic vein harvest:  The left greater saphenous vein was harvested endoscopically through a 2 cm incision medial to the left knee. It was harvested from the upper thigh to below the knee. It was a medium-sized vein of good quality. The side branches were all ligated with 4-0 silk ties.    Coronary arteries:  The heart was completely encased in fat. The coronary arteries were examined. They were all lying deep within the epicardial fat.    LAD:   Located in the mid-portion. Mild disease at this location. The remainder of the artery was not visible. The diagonal was located distally where it was small but graftable.  LCX:  OM1 located proximally and was a medium-caliber vessel with no disease. The OM2 was located distally where it was a medium-caliber vessel with no disease.   RCA:  Located distally where it was a medium-caliber vessel with no disease.   Grafts:  1. LIMA to the LAD: 1.75 mm. It was sewn end to side using 8-0 prolene continuous suture. 2. SVG to diagonal:  1.5 mm. It was sewn end to side using 7-0 prolene continuous suture. 3. SVG to RCA:  1.75 mm. It was sewn end to side using 7-0 prolene continuous suture. 4. Sequential SVG to OM1:  1.75 mm. It was sewn sequential side to side using 7-0 prolene continuous suture. 5. Sequential SVG to OM2: 1.75 mm. It was sewn sequential end to side using 7-0 prolene continuous suture.  All anastomoses were tedious due to the vessels being deep in the epicardial fat and the vessels were relatively small due to diabetes.  The proximal vein graft anastomoses were performed to the mid-ascending aorta using continuous 6-0 prolene suture. Graft markers were placed around the proximal anastomoses.   Completion:  The patient was rewarmed to 37 degrees Centigrade. The clamp was removed from the LIMA pedicle and there was rapid warming of the septum and return of ventricular fibrillation. The crossclamp was removed with a time of 122 minutes. There was spontaneous return of sinus rhythm. The distal and proximal anastomoses were checked for hemostasis. The position of the grafts was satisfactory. Two temporary epicardial pacing wires were placed on the right atrium and two on the right ventricle. The patient  was weaned from CPB without difficulty on no inotropes. CPB time was 141 minutes. Cardiac output was 5 LPM. TEE showed normal LV systolic function. Heparin was fully reversed with protamine  and the aortic and venous cannulas removed. Hemostasis was achieved. Mediastinal and left pleural drainage tubes were placed. The sternum was closed with  #6 stainless steel wires. The fascia was closed with continuous # 1 vicryl suture. The subcutaneous tissue was closed with 2-0 vicryl continuous suture. The skin was closed with 3-0 vicryl subcuticular suture. All sponge, needle, and instrument counts were reported correct at the end of the case. Dry sterile dressings were placed over the incisions and around the chest tubes which were connected to pleurevac suction. The patient was then transported to the surgical intensive care unit in stable condition.

## 2018-11-17 NOTE — Transfer of Care (Signed)
Immediate Anesthesia Transfer of Care Note  Patient: Adriana SagoVictoria Leigh Barner  Procedure(s) Performed: CORONARY ARTERY BYPASS GRAFTING (CABG), ON PUMP, TIMES FIVE, USING LEFT INTERNAL MAMMARY ARTERY AND ENDOSCOPICALLY HARVESTED LEFT GREATER SAPHENOUS VEIN (N/A Chest) TRANSESOPHAGEAL ECHOCARDIOGRAM (TEE) (N/A )  Patient Location: ICU  Anesthesia Type:General  Level of Consciousness: sedated and Patient remains intubated per anesthesia plan  Airway & Oxygen Therapy: Patient remains intubated per anesthesia plan and Patient placed on Ventilator (see vital sign flow sheet for setting)  Post-op Assessment: Report given to RN and Post -op Vital signs reviewed and stable  Post vital signs: Reviewed and stable  Last Vitals:  Vitals Value Taken Time  BP 90/54 11/17/2018  2:15 PM  Temp    Pulse 79 11/17/2018  2:18 PM  Resp 12 11/17/2018  2:18 PM  SpO2 96 % 11/17/2018  2:18 PM  Vitals shown include unvalidated device data.  Last Pain:  Vitals:   11/17/18 0614  TempSrc:   PainSc: 0-No pain      Patients Stated Pain Goal: 9 (11/17/18 [id number])  Complications: No apparent anesthesia complications

## 2018-11-17 NOTE — Interval H&P Note (Signed)
History and Physical Interval Note:  11/17/2018 7:29 AM  Adriana [name]  has presented today for surgery, with the diagnosis of CAD  The various methods of treatment have been discussed with the patient and family. After consideration of risks, benefits and other options for treatment, the patient has consented to  Procedure(s): CORONARY ARTERY BYPASS GRAFTING (CABG) (N/A) TRANSESOPHAGEAL ECHOCARDIOGRAM (TEE) (N/A) as a surgical intervention .  The patient's history has been reviewed, patient examined, no change in status, stable for surgery.  I have reviewed the patient's chart and labs.  Questions were answered to the patient's satisfaction.     Alleen BorneBryan K 

## 2018-11-17 NOTE — Brief Op Note (Signed)
11/17/2018  7:37 AM  PATIENT:  Adriana [name]  72 y.o. female  PRE-OPERATIVE DIAGNOSIS:  CAD  POST-OPERATIVE DIAGNOSIS:  CAD  PROCEDURE:  Procedure(s): CORONARY ARTERY BYPASS GRAFTING (CABG), ON PUMP, TIMES FIVE, USING LEFT INTERNAL MAMMARY ARTERY AND ENDOSCOPICALLY HARVESTED LEFT GREATER SAPHENOUS VEIN (N/A) TRANSESOPHAGEAL ECHOCARDIOGRAM (TEE) (N/A)  LIMA to LAD SVG to OM1 and OM2 SVG to RCA SVG to Diag1  SURGEON:  Surgeon(s) and Role:    * Bartle, Payton DoughtyBryan K, MD - Primary  PHYSICIAN ASSISTANT:  Jari Favreessa Conte, PA-C   ANESTHESIA:   GENERAL  EBL:  800 mL   BLOOD ADMINISTERED:none  DRAINS: routine   LOCAL MEDICATIONS USED:  NONE  SPECIMEN:  No Specimen  DISPOSITION OF SPECIMEN:  N/A  COUNTS:  YES  DICTATION: .Dragon Dictation  PLAN OF CARE: Admit to inpatient   PATIENT DISPOSITION:  ICU - intubated and hemodynamically stable.   Delay start of Pharmacological VTE agent (>24hrs) due to surgical blood loss or risk of bleeding: yes

## 2018-11-17 NOTE — Anesthesia Procedure Notes (Signed)
Central Venous Catheter Insertion Performed by: Val EagleMoser, Rubin Dais, MD, anesthesiologist Start/End12/01/2018 7:02 AM, 11/17/2018 7:12 AM Patient location: Pre-op. Preanesthetic checklist: patient identified, IV checked, site marked, risks and benefits discussed, surgical consent, monitors and equipment checked, pre-op evaluation, timeout performed and anesthesia consent Hand hygiene performed  and maximum sterile barriers used  PA cath was placed.Swan type:thermodilution Procedure performed without using ultrasound guided technique. Attempts: 1 Patient tolerated the procedure well with no immediate complications.

## 2018-11-17 NOTE — Progress Notes (Signed)
      [address]       [place] [id number]             [phone]      S/p CABG X 5   Intubated, starting to wake up BP 99/75   Pulse 87   Temp 97.7 F (36.5 C)   Resp 13   Ht 5\' 6"  (1.676 m)   Wt 68.5 kg   SpO2 100%   BMI 24.37 kg/m  PA= 45/21 CI= 2.7 Hct= 28  Intake/Output Summary (Last 24 hours) at 11/17/2018 1711 Last data filed at 11/17/2018 1500 Gross per 24 hour  Intake 3400.91 ml  Output 1790 ml  Net 1610.91 ml   CBG low- has been treated per protocol  Viviann SpareSteven C. Dorris FetchHendrickson, MD [facility] [phone]

## 2018-11-17 NOTE — Procedures (Signed)
Extubation Procedure Note  Patient Details:   Name: Adriana [name] DOB: 1946/08/28 MRN: [id number]   Airway Documentation:    Vent end date: 11/17/18 Vent end time: 1855   Evaluation  O2 sats: stable throughout Complications: No apparent complications Patient did tolerate procedure well. Bilateral Breath Sounds: Clear   Yes   Patient extubated to 4L [name] without complications. RN at bedside. Positive cuff leak. NIF -25  VC 1.9L  Rance MuirKayla N Hacker 11/17/2018, 7:03 PM

## 2018-11-17 NOTE — Progress Notes (Signed)
Paged Dr. Dorris FetchHendrickson concerning pt's ABG. Pt awake and following commands and passing breathing trials. MD order to give amp of bicarb and extubate.   Delories HeinzMelissa Rhetta Cleek, RN

## 2018-11-17 NOTE — Anesthesia Procedure Notes (Signed)
Procedure Name: Intubation Date/Time: 11/17/2018 7:50 AM Performed by: Verdie Drown, CRNA Pre-anesthesia Checklist: Patient identified, Emergency Drugs available, Suction available and Patient being monitored Patient Re-evaluated:Patient Re-evaluated prior to induction Oxygen Delivery Method: Circle System Utilized Preoxygenation: Pre-oxygenation with 100% oxygen Induction Type: IV induction Ventilation: Mask ventilation without difficulty and Oral airway inserted - appropriate to patient size Laryngoscope Size: Mac and 3 Grade View: Grade II Tube type: Oral Tube size: 8.0 mm Number of attempts: 1 Airway Equipment and Method: Stylet and Oral airway Placement Confirmation: ETT inserted through vocal cords under direct vision,  positive ETCO2 and breath sounds checked- equal and bilateral Tube secured with: Tape Dental Injury: Teeth and Oropharynx as per pre-operative assessment  Comments: Inserted by [employer] SRNA

## 2018-11-17 NOTE — Anesthesia Procedure Notes (Signed)
Arterial Line Insertion Start/End12/01/2018 7:00 AM Performed by: CRNA  Preanesthetic checklist: patient identified, IV checked, site marked, risks and benefits discussed, surgical consent, monitors and equipment checked, pre-op evaluation, timeout performed and anesthesia consent Lidocaine 1% used for infiltration and patient sedated radial was placed Catheter size: 20 G Hand hygiene performed , maximum sterile barriers used  and Seldinger technique used Allen's test indicative of satisfactory collateral circulation Attempts: 1 Procedure performed without using ultrasound guided technique. Following insertion, Biopatch and dressing applied. Post procedure assessment: normal  Patient tolerated the procedure well with no immediate complications. Additional procedure comments: Inserted by [employer] Esmee Fallaw, SRNA.

## 2018-11-17 NOTE — H&P (Signed)
[address]       Adriana [name] [address]             [phone]      Cardiothoracic Surgery Admission History and Physical   PCP is Adriana Prince, MD  Referring Provider is Adriana Bollman, MD      Chief Complaint  Patient presents with  . Coronary Artery Disease       HPI:   The patient is a 72 year old woman with a history of poorly controlled type 2 diabetes, hypertension, hyperlipidemia, factor V Leiden mutation with DVT, cerebrovascular disease and stroke status post bilateral carotid endarterectomy, who presents with a history of progressive exertional shortness of breath and fatigue. She was evaluated by Dr. Excell Seltzer and underwent echocardiogram on 10/29/2018. This showed an ejection fraction of 50 to 55% with grade 1 diastolic dysfunction. There was hypokinesis of the mid anteroseptal, mid inferior septal, mid apical inferior, apical septal, and apical myocardium. A nuclear stress test on 10/29/2018 was high risk study showed an ejection fraction of 30 to 44% with no ST segment changes during stress. There is a medium size defect of moderate severity in the basal inferoseptal, mid inferior septal, apical septal, and apical inferior locations. There is a second severe defect of medium size in the mid anterior, mid anterior septal, apical anterior, apical septal, and apical location consistent with prior myocardial infarction with peri-infarct ischemia in the anterior septal, inferoseptal, and apical regions. Cardiac catheterization on 11/04/2018 showed chronic total occlusion of the right coronary artery which was codominant with left-to-right collaterals from the left circumflex. There is chronic subtotal occlusion of the LAD filling antegrade and also by collaterals from the right. There is severe diffuse proximal and mid LAD disease with stenosis in the first diagonal branch. There is severe diffuse obtuse marginal stenosis. Left ventricular end-diastolic pressure  was mildly elevated at 16 mmHg. Left ventricular ejection fraction was 50 to 55%.   She reports worsening exertional shortness of breath and fatigue over the past few years which is becoming more noticeable over the past 6 months. She is also recently had significant heartburn type lower chest pain which she has never had in the past. She does note some shortness of breath when lying down at night.   She says that her prior DVT and phlebitis have been in the right lower extremity only.      Past Medical History:  Diagnosis Date  . Abdominal pain 05/01/2018  . AKI (acute kidney injury) (HCC) 05/02/2018  . Cellulitis of right leg 05/01/2018  . Depression   . DVT (deep venous thrombosis) (HCC)   . Essential hypertension   . Factor V Leiden mutation (HCC)   . HLD (hyperlipidemia)   . Poorly controlled type 2 diabetes mellitus (HCC)   . 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 Sepsis (HCC) 05/01/2018  . Stroke (cerebrum) [facility])         Past Surgical History:  Procedure Laterality Date  . ABDOMINAL HYSTERECTOMY    . BACK SURGERY    . Carotid artery endarterectomy    . CATARACT EXTRACTION    . CHOLECYSTECTOMY    . LEFT HEART CATH AND CORONARY ANGIOGRAPHY N/A 11/04/2018   Procedure: LEFT HEART CATH AND CORONARY ANGIOGRAPHY; Surgeon: Adriana Bollman, MD; Location: [facility] INVASIVE CV LAB; Service: Cardiovascular; Laterality: N/A;  . TONSILLECTOMY    . TUBAL LIGATION  Family History  Problem Relation Age of Onset  . Stroke Mother   . Lung cancer Father   . COPD Brother   . Diabetes Brother   . Cataracts Maternal Grandmother   . Diabetes Maternal Grandmother   . Amblyopia Neg Hx   . Blindness Neg Hx   . Glaucoma Neg Hx   . Hypertension Neg Hx   . Macular degeneration Neg Hx   . Retinal detachment Neg Hx   . Strabismus Neg Hx   . Retinitis pigmentosa Neg Hx    Social History  Social History        Tobacco Use  . Smoking status:  Former Smoker    Last attempt to quit: 2004    Years since quitting: 15.9  . Smokeless tobacco: Never Used  Substance Use Topics  . Alcohol use: Not Currently  . Drug use: Not Currently         Current Outpatient Medications  Medication Sig Dispense Refill  . acetaminophen (TYLENOL) 500 MG tablet Take 1 tablet (500 mg total) by mouth every 6 (six) hours as needed for moderate pain (or Fever >/= 101). (Patient taking differently: Take 500-1,000 mg by mouth every 6 (six) hours as needed for moderate pain, fever or headache. ) 20 tablet 0  . buPROPion (WELLBUTRIN XL) 300 MG 24 hr tablet Take 300 mg by mouth daily.   2  . calcium carbonate (ANTACID) 500 MG chewable tablet Chew 2 tablets by mouth daily as needed for indigestion or heartburn.    . chlorthalidone (HYGROTON) 25 MG tablet Take 25 mg by mouth daily.    . Cholecalciferol (VITAMIN D) 2000 units CAPS Take 2,000 Units by mouth daily.     . clobetasol cream (TEMOVATE) 0.05 % Apply 1 application topically daily as needed (for psoriasis).     . Continuous Blood Gluc Sensor (FREESTYLE LIBRE 14 DAY SENSOR) MISC 1 each by Does not apply route every 14 (fourteen) days. Change every 2 weeks 2 each 11  . diclofenac sodium (VOLTAREN) 1 % GEL Apply 1 application topically 2 (two) times daily as needed (for pain).     . Insulin Glargine (BASAGLAR KWIKPEN) 100 UNIT/ML SOPN Inject 24 Units into the skin at bedtime.    . insulin lispro (HUMALOG) 100 UNIT/ML injection Inject 12 Units into the skin 3 (three) times daily before meals.    Marland Kitchen lisinopril (PRINIVIL,ZESTRIL) 5 MG tablet Take 5 mg by mouth daily.    . metFORMIN (GLUCOPHAGE) 1000 MG tablet Take 1,000 mg by mouth 2 (two) times daily with a meal.    . metoprolol tartrate (LOPRESSOR) 25 MG tablet Take 0.5 tablets (12.5 mg total) by mouth 2 (two) times daily. 60 tablet 11  . potassium citrate (UROCIT-K) 10 MEQ (1080 MG) SR tablet Take 10 mEq by mouth 2 (two) times daily.     . promethazine (PHENERGAN)  25 MG tablet Take 12.5 mg by mouth every evening.   1  . simvastatin (ZOCOR) 40 MG tablet Take 40 mg by mouth daily.    Marland Kitchen warfarin (COUMADIN) 5 MG tablet Take 5 mg by mouth every evening.     . white petrolatum (VASELINE) GEL Apply 1 application topically daily as needed (for psoriasis).     No current facility-administered medications for this visit.        Allergies  Allergen Reactions  . Anesthesia S-I-60 Nausea And Vomiting  . Aspirin Nausea And Vomiting    Review of Systems   Constitutional: Positive for  activity change and fatigue.  HENT: Negative.  Eyes: Negative.  Respiratory: Positive for shortness of breath. Negative for chest tightness.  Cardiovascular: Negative for chest pain, palpitations and leg swelling.  Gastrointestinal:  Frequent heartburn  Endocrine: Negative.  Genitourinary: Negative.  Musculoskeletal: Positive for arthralgias.  Skin: Negative.  Neurological:  History of prior stroke. History of neuropathy.  Hematological:  Factor V Leiden mutation with history of DVT. She is on chronic Coumadin therapy  Psychiatric/Behavioral: Negative.   BP 138/70 (BP Location: Left Arm, Patient Position: Sitting, Cuff Size: Normal)  Pulse 88  Resp 18  Ht 5\' 6"  (1.676 m)  Wt 149 lb (67.6 kg)  SpO2 93% Comment: RA  BMI 24.05 kg/m   Physical Exam   Constitutional: She is oriented to person, place, and time. She appears well-developed and well-nourished.  HENT:  Head: Normocephalic and atraumatic.  Mouth/Throat: Oropharynx is clear and moist.  Eyes: Pupils are equal, round, and reactive to light. Conjunctivae and EOM are normal.  Neck: Normal range of motion. Neck supple. No JVD present. No thyromegaly present.  Cardiovascular: Normal rate, regular rhythm, normal heart sounds and intact distal pulses.  No murmur heard.  Pulmonary/Chest: Effort normal and breath sounds normal. No respiratory distress.  Abdominal: Soft. Bowel sounds are normal. She exhibits no  distension. There is no tenderness.  Musculoskeletal: Normal range of motion. She exhibits no edema.  Lymphadenopathy:  She has no cervical adenopathy.  Neurological: She is alert and oriented to person, place, and time. She has normal strength. No cranial nerve deficit or sensory deficit.  Skin: Skin is warm and dry.  Psychiatric: She has a normal mood and affect.   Diagnostic Tests:   Procedures  LEFT HEART CATH AND CORONARY ANGIOGRAPHY  Conclusion  Prox RCA lesion is 100% stenosed.  Mid LM lesion is 30% stenosed.  There is mild left ventricular systolic dysfunction.  LV end diastolic pressure is mildly elevated.  The left ventricular ejection fraction is 50-55% by visual estimate.  Ost 1st Mrg to 1st Mrg lesion is 75% stenosed.  Ost LAD lesion is 90% stenosed.  Ost 1st Diag to 1st Diag lesion is 80% stenosed.  Prox LAD-1 lesion is 80% stenosed.  Prox LAD-2 lesion is 80% stenosed. 1. Chronic total occlusion of the RCA (codominant) with left-right collaterals from the circumflex  2. Chronic subtotal occlusion of the LAD, filling antegrade but also supplied by right-left collaterals  3. Severe diffuse proximal and mid-LAD and first diagonal stenoses  4. Severe diffuse OM stenosis  5. Mild segmental LV systolic dysfunction  Recommend outpatient TCTS consultation for consideration of CABG in this patient with diabetes, LV dysfunction, and multivessel CAD   Indications  Abnormal nuclear cardiac imaging test [R93.1 (ICD-10-CM)]  Procedural Details/Technique  Technical Details INDICATION: 72 yo woman with hx of Factor V Leiden on chronic warfarin, longstanding insulin-dependent diabetes, and prior stroke, presenting with exertional dyspnea and an abnormal Myoview scan demonstrating reduced LVEF with anteroseptal and inferoseptal infarct and peri-infarct ischemia. Referred for cardiac catheterization and possible PCI.   PROCEDURAL DETAILS: The right wrist was prepped, draped, and  anesthetized with 1% lidocaine. Using the modified Seldinger technique, a 5/6 French Slender sheath was introduced into the right radial artery. 3 mg of verapamil was administered through the sheath, weight-based unfractionated heparin was administered intravenously. Standard Judkins catheters were used for selective coronary angiography and left ventriculography. Catheter exchanges were performed over an exchange length guidewire. There were no immediate procedural complications. A TR band was  used for radial hemostasis at the completion of the procedure. The patient was transferred to the post catheterization recovery area for further monitoring.     Estimated blood loss <50 mL.  During this procedure the patient was administered the following to achieve and maintain moderate conscious sedation: Versed 1 mg, Fentanyl 25 mcg, while the patient's heart rate, blood pressure, and oxygen saturation were continuously monitored. The period of conscious sedation was 28 minutes, of which I was present face-to-face 100% of this time.  Coronary Findings  Diagnostic  Dominance: Right  Left Main  Mid LM lesion 30% stenosed  Mid LM lesion is 30% stenosed. The lesion is moderately calcified.  Left Anterior Descending  Collaterals  Prox LAD filled by collaterals from Ost RCA.    Ost LAD lesion 90% stenosed  Ost LAD lesion is 90% stenosed. The lesion is eccentric. The lesion is calcified. Severe ostial LAD stenosis with heavy calcification  Prox LAD-1 lesion 80% stenosed  Prox LAD-1 lesion is 80% stenosed.  Prox LAD-2 lesion 80% stenosed  Prox LAD-2 lesion is 80% stenosed.  First Diagonal Branch  Ost 1st Diag to 1st Diag lesion 80% stenosed  Ost 1st Diag to 1st Diag lesion is 80% stenosed.  Left Circumflex  First Obtuse Marginal Branch  Ost 1st Mrg to 1st Mrg lesion 75% stenosed  Ost 1st Mrg to 1st Mrg lesion is 75% stenosed. the first OM divides into twin branches, with the superior branch showing  50% stenosis and the larger inferior branch showing diffuse 75% stenosis.  Right Coronary Artery  Prox RCA lesion 100% stenosed  Prox RCA lesion is 100% stenosed. The lesion is moderately calcified. collateralized from the LCA, chronic occlusion  Acute Marginal Branch  Collaterals  Acute Mrg filled by collaterals from Dist Cx.    Right Posterior Descending Artery  Collaterals  RPDA filled by collaterals from Dist Cx.    Intervention  No interventions have been documented.  Wall Motion       Resting       The mid anterior, basilar anterior, basilar inferior and apical anterior segments are normal. The mid inferior and apical inferior segments are hypokinetic.       Left Heart  Left Ventricle The left ventricular size is normal. There is mild left ventricular systolic dysfunction. LV end diastolic pressure is mildly elevated. The left ventricular ejection fraction is 50-55% by visual estimate. There are LV function abnormalities due to segmental dysfunction. There is hypokinesis of the inferoapical wall, but overall LVEF is only mildly reduced, estimated at 50-55%  Coronary Diagrams  Diagnostic Diagram     Implants     No implant documentation for this case.  MERGE Images  Link to Procedure Log   Show images for CARDIAC CATHETERIZATION Procedure Log  Hemo Data   Most Recent Value  Aortic Mean Gradient 16.25 mmHg  Aortic Peak Gradient 2 mmHg  AO Systolic Pressure 123 mmHg  AO Diastolic Pressure 59 mmHg  AO Mean 86 mmHg  LV Systolic Pressure 168 mmHg  LV Diastolic Pressure 4 mmHg  LV EDP 16 mmHg  AOp Systolic Pressure 169 mmHg  AOp Diastolic Pressure 73 mmHg  AOp Mean Pressure 112 mmHg  LVp Systolic Pressure 171 mmHg  LVp Diastolic Pressure 1 mmHg  LVp EDP Pressure 26 mmHg  *[facility]*  [address]  [place], [place] [address]  [phone]  -------------------------------------------------------------------  Transthoracic Echocardiography  Patient:  Deryl, Ports  MR #: [id number]  Study  Date: 10/29/2018  Gender: F  Age: 44  Height: 167.6 cm  Weight: 68.9 kg  BSA: 1.8 m^2  Pt. Status:  Room:  Dora Sims, MD  REFERRING Adriana Bollman, MD  SONOGRAPHER Aida Raider, RDCS  PERFORMING [facility], Outpatient  ATTENDING Cristal Deer, Bridgette  cc:  -------------------------------------------------------------------  LV EF: 50% - 55%  -------------------------------------------------------------------  Indications: R06.02 Shortness of breath. ECHO WITH DEFINITY.  -------------------------------------------------------------------  History: PMH: Acquired from the patient and from the patient&'s  chart. PMH: DVT. Stroke. Risk factors: Former tobacco use.  Hypertension. Diabetes mellitus. Dyslipidemia.  -------------------------------------------------------------------  Study Conclusions  - Left ventricle: The cavity size was normal. There was moderate  concentric hypertrophy. Systolic function was normal. The  estimated ejection fraction was in the range of 50% to 55%.  Doppler parameters are consistent with abnormal left ventricular  relaxation (grade 1 diastolic dysfunction). Acoustic contrast  opacification revealed no evidence ofthrombus.  - Regional wall motion abnormality: Hypokinesis of the mid  anteroseptal, mid inferoseptal, mid-apical inferior, apical  septal, and apical myocardium.  - Mitral valve: There was trivial regurgitation.  - Right ventricle: Systolic function was normal.  - Right atrium: There was a possible, small, fixed mass on the  right side of the interatrial septum. This is best seen on image  51. Consider myxoma.  - Atrial septum: The septum was thickened. A patent foramen ovale  cannot be excluded. There was a possiblemasson the right side of  the interatrial septum.  - Tricuspid valve: There was trivial regurgitation.  Impressions:  - 1. Low normal EF, but wall motion  abnormalities noted.  Hypokinesis of mid to distal anteroseptum, inferoseptum, and  inferior walls as well as the apex. Echo contrast (definity) used  to better define endocardial borders and exclude LV thrombus)  2. Apical views suggest the presence of a small, fixed mass  adjacent to the intra-atrial septum. Consider mxyoma.  -------------------------------------------------------------------  Study data: Study status: Routine. Procedure: The patient  reported no pain pre or post test. Transthoracic echocardiography  for left ventricular function evaluation, for right ventricular  function evaluation, and for assessment of valvular function. Image  quality was adequate. Intravenous contrast (Definity) was  administered to enhance regional wall motion assessment and opacify  the LV. Study completion: There were no complications.  Transthoracic echocardiography. M-mode, complete 2D, spectral  Doppler, and color Doppler. Birthdate: Patient birthdate:  [birth year]. Age: Patient is 72 yr old. Sex: Gender: female.  BMI: 24.5 kg/m^2. Blood pressure: 118/62 Patient status:  Outpatient. Study date: Study date: 10/29/2018. Study time: 10:52  AM. Location: [facility]  -------------------------------------------------------------------  -------------------------------------------------------------------  Left ventricle: The cavity size was normal. There was moderate  concentric hypertrophy. Systolic function was normal. The estimated  ejection fraction was in the range of 50% to 55%. Acoustic  contrast opacification revealed no evidence ofthrombus. Regional  wall motion abnormalities: Hypokinesis of the mid anteroseptal,  mid inferoseptal, mid-apical inferior, apical septal, and apical  myocardium. Doppler parameters are consistent with abnormal left  ventricular relaxation (grade 1 diastolic dysfunction).  -------------------------------------------------------------------  Aortic valve:  Trileaflet; normal thickness leaflets. Mobility was  not restricted. Doppler: Transvalvular velocity was within the  normal range. There was no stenosis. There was no regurgitation.  -------------------------------------------------------------------  Aorta: Aortic root: The aortic root was normal in size.  -------------------------------------------------------------------  Mitral valve: Structurally normal valve. Mobility was not  restricted. Doppler: Transvalvular velocity was within the normal  range. There was no evidence for stenosis. There was trivial  regurgitation.  -------------------------------------------------------------------  Left atrium: The atrium was normal in size.  -------------------------------------------------------------------  Atrial septum: Not well visualized. The septum was thickened. A  patent foramen ovale cannot be excluded. There was a possiblemasson  the right side of the interatrial septum.  -------------------------------------------------------------------  Right ventricle: The cavity size was normal. Wall thickness was  normal. Systolic function was normal.  -------------------------------------------------------------------  Pulmonic valve: Poorly visualized. The valve appears to be  grossly normal. Doppler: Transvalvular velocity was within the  normal range. There was no evidence for stenosis. There was no  significant regurgitation.  -------------------------------------------------------------------  Tricuspid valve: Structurally normal valve. Doppler:  Transvalvular velocity was within the normal range. There was  trivial regurgitation.  -------------------------------------------------------------------  Pulmonary artery: The main pulmonary artery was normal-sized.  Systolic pressure could not be accurately estimated.  -------------------------------------------------------------------  Right atrium: The atrium was normal in size. There  was a possible,  small, fixed mass on the right side of the interatrial septum. This  is best seen on image 51. Consider myxoma.  -------------------------------------------------------------------  Pericardium: There was no pericardial effusion.  -------------------------------------------------------------------  Systemic veins:  Inferior vena cava: Not visualized.  -------------------------------------------------------------------  Measurements  Left ventricle Value Reference  LV ID, ED, PLAX chordal (L) 36 mm 43 - 52  LV ID, ES, PLAX chordal 25 mm 23 - 38  LV fx shortening, PLAX chordal 31 % >=29  LV PW thickness, ED 14 mm ----------  IVS/LV PW ratio, ED 1 <=1.3  Stroke volume, 2D 54 ml ----------  Stroke volume/bsa, 2D 30 ml/m^2 ----------  LV e&', lateral 4.68 cm/s ----------  LV E/e&', lateral 13.68 ----------  LV e&', medial 5.66 cm/s ----------  LV E/e&', medial 11.31 ----------  LV e&', average 5.17 cm/s ----------  LV E/e&', average 12.38 ----------  Ventricular septum Value Reference  IVS thickness, ED 14 mm ----------  LVOT Value Reference  LVOT ID, S 19 mm ----------  LVOT area 2.84 cm^2 ----------  LVOT ID 19 mm ----------  LVOT peak velocity, S 98.9 cm/s ----------  LVOT mean velocity, S 71.7 cm/s ----------  LVOT VTI, S 18.9 cm ----------  Stroke volume (SV), LVOT DP 53.6 ml ----------  Stroke index (SV/bsa), LVOT DP 29.8 ml/m^2 ----------  Aorta Value Reference  Aortic root ID, ED 31 mm ----------  Ascending aorta ID, A-P, S 34 mm ----------  Left atrium Value Reference  LA ID, A-P, ES 38 mm ----------  LA ID/bsa, A-P 2.11 cm/m^2 <=2.2  LA volume, S 33.1 ml ----------  LA volume/bsa, S 18.4 ml/m^2 ----------  LA volume, ES, 1-p A4C 30.4 ml ----------  LA volume/bsa, ES, 1-p A4C 16.9 ml/m^2 ----------  LA volume, ES, 1-p A2C 35.3 ml ----------  LA volume/bsa, ES, 1-p A2C 19.6 ml/m^2 ----------  Mitral valve Value Reference  Mitral E-wave peak velocity  64 cm/s ----------  Mitral A-wave peak velocity 98.5 cm/s ----------  Mitral deceleration time (H) 278 ms 150 - 230  Mitral E/A ratio, peak 0.6 ----------  Right atrium Value Reference  RA ID, S-I, ES, A4C (L) 33.4 mm 34 - 49  RA area, ES, A4C (L) 7.56 cm^2 8.3 - 19.5  RA volume, ES, A/L 14 ml ----------  RA volume/bsa, ES, A/L 7.8 ml/m^2 ----------  Right ventricle Value Reference  RV ID, minor axis, ED, A4C base 24 mm ----------  TAPSE 18.9 mm ----------  RV s&', lateral, S 8.81 cm/s ----------  Legend:  (L) and (H) mark values outside specified reference range.  -------------------------------------------------------------------  Prepared and Electronically Authenticated by  Adriana [name]  2019-11-13T14:40:19   Impression:   This 71 year old woman has severe three-vessel coronary disease with progressive exertional fatigue and shortness of breath and a high risk nuclear stress test showing prior infarction and significant peri-infarct ischemia in the anterior septal, inferoseptal, and apical regions. I suspect that her exertional shortness of breath, fatigue, and heartburn are probably ischemic symptoms. I agree that coronary bypass graft surgery is the best treatment to prevent further ischemia and infarction and improve her quality of life. I reviewed the cardiac catheterization films with the patient and her family and answered all of their questions. I discussed the operative procedure with the patient and family including alternatives, benefits and risks; including but not limited to bleeding, blood transfusion, infection, stroke, myocardial infarction, graft failure, heart block requiring a permanent pacemaker, organ dysfunction, and death. Adriana [name] understands and agrees to proceed.   Plan:   Coronary bypass graft surgery. Since her prior DVT and problems with phlebitis have been in the right lower extremity we will plan to harvest vein from her left lower  extremity.   Alleen Borne, MD  [facility]  [phone]

## 2018-11-17 NOTE — Anesthesia Postprocedure Evaluation (Signed)
Anesthesia Post Note  Patient: Adriana [name]  Procedure(s) Performed: CORONARY ARTERY BYPASS GRAFTING (CABG), ON PUMP, TIMES FIVE, USING LEFT INTERNAL MAMMARY ARTERY AND ENDOSCOPICALLY HARVESTED LEFT GREATER SAPHENOUS VEIN (N/A Chest) TRANSESOPHAGEAL ECHOCARDIOGRAM (TEE) (N/A )     Patient location during evaluation: SICU Anesthesia Type: General Level of consciousness: sedated Pain management: pain level controlled Vital Signs Assessment: post-procedure vital signs reviewed and stable Respiratory status: patient remains intubated per anesthesia plan Cardiovascular status: stable Postop Assessment: no apparent nausea or vomiting Anesthetic complications: no    Last Vitals:  Vitals:   11/17/18 1411 11/17/18 1417  BP:    Pulse:  80  Resp:  14  Temp:    SpO2: 96% 96%    Last Pain:  Vitals:   11/17/18 0614  TempSrc:   PainSc: 0-No pain                 Ailin Rochford

## 2018-11-18 ENCOUNTER — Encounter (HOSPITAL_COMMUNITY): Payer: Self-pay | Admitting: Surgery

## 2018-11-18 ENCOUNTER — Inpatient Hospital Stay (HOSPITAL_COMMUNITY): Payer: Medicare Other

## 2018-11-18 LAB — POCT I-STAT, CHEM 8
BUN: 17 mg/dL (ref 8–23)
Calcium, Ion: 1.1 mmol/L — ABNORMAL LOW (ref 1.15–1.40)
Chloride: 103 mmol/L (ref 98–111)
Creatinine, Ser: 1.1 mg/dL — ABNORMAL HIGH (ref 0.44–1.00)
Glucose, Bld: 144 mg/dL — ABNORMAL HIGH (ref 70–99)
HCT: 35 % — ABNORMAL LOW (ref 36.0–46.0)
Hemoglobin: 11.9 g/dL — ABNORMAL LOW (ref 12.0–15.0)
Potassium: 5.4 mmol/L — ABNORMAL HIGH (ref 3.5–5.1)
Sodium: 138 mmol/L (ref 135–145)
TCO2: 24 mmol/L (ref 22–32)

## 2018-11-18 LAB — CBC
HCT: 35.6 % — ABNORMAL LOW (ref 36.0–46.0)
HCT: 36.9 % (ref 36.0–46.0)
HEMOGLOBIN: 11.1 g/dL — AB (ref 12.0–15.0)
Hemoglobin: 11.4 g/dL — ABNORMAL LOW (ref 12.0–15.0)
MCH: 29.5 pg (ref 26.0–34.0)
MCH: 29 pg (ref 26.0–34.0)
MCHC: 32 g/dL (ref 30.0–36.0)
MCHC: 30.1 g/dL (ref 30.0–36.0)
MCV: 92.2 fL (ref 80.0–100.0)
MCV: 96.3 fL (ref 80.0–100.0)
PLATELETS: 176 10*3/uL (ref 150–400)
Platelets: 177 10*3/uL (ref 150–400)
RBC: 3.86 MIL/uL — ABNORMAL LOW (ref 3.87–5.11)
RBC: 3.83 MIL/uL — ABNORMAL LOW (ref 3.87–5.11)
RDW: 13.2 % (ref 11.5–15.5)
RDW: 13.4 % (ref 11.5–15.5)
WBC: 15 10*3/uL — AB (ref 4.0–10.5)
WBC: 18.4 10*3/uL — AB (ref 4.0–10.5)
nRBC: 0 % (ref 0.0–0.2)
nRBC: 0 % (ref 0.0–0.2)

## 2018-11-18 LAB — BASIC METABOLIC PANEL
Anion gap: 9 (ref 5–15)
BUN: 9 mg/dL (ref 8–23)
CO2: 23 mmol/L (ref 22–32)
Calcium: 7.8 mg/dL — ABNORMAL LOW (ref 8.9–10.3)
Chloride: 105 mmol/L (ref 98–111)
Creatinine, Ser: 0.97 mg/dL (ref 0.44–1.00)
GFR calc Af Amer: >60 mL/min (ref >60)
GFR, EST NON AFRICAN AMERICAN: 59 mL/min — AB (ref >60)
Glucose, Bld: 106 mg/dL — ABNORMAL HIGH (ref 70–99)
Potassium: 4.5 mmol/L (ref 3.5–5.1)
Sodium: 137 mmol/L (ref 135–145)

## 2018-11-18 LAB — MAGNESIUM
Magnesium: 2.4 mg/dL (ref 1.7–2.4)
Magnesium: 2.4 mg/dL (ref 1.7–2.4)

## 2018-11-18 LAB — CREATININE, SERUM
CREATININE: 1.31 mg/dL — AB (ref 0.44–1.00)
GFR calc Af Amer: 48 mL/min — ABNORMAL LOW (ref >60)
GFR calc non Af Amer: 41 mL/min — ABNORMAL LOW (ref >60)

## 2018-11-18 LAB — GLUCOSE, CAPILLARY
GLUCOSE-CAPILLARY: 100 mg/dL — AB (ref 70–99)
Glucose-Capillary: 103 mg/dL — ABNORMAL HIGH (ref 70–99)
Glucose-Capillary: 107 mg/dL — ABNORMAL HIGH (ref 70–99)
Glucose-Capillary: 110 mg/dL — ABNORMAL HIGH (ref 70–99)
Glucose-Capillary: 111 mg/dL — ABNORMAL HIGH (ref 70–99)
Glucose-Capillary: 115 mg/dL — ABNORMAL HIGH (ref 70–99)
Glucose-Capillary: 121 mg/dL — ABNORMAL HIGH (ref 70–99)
Glucose-Capillary: 122 mg/dL — ABNORMAL HIGH (ref 70–99)
Glucose-Capillary: 125 mg/dL — ABNORMAL HIGH (ref 70–99)
Glucose-Capillary: 129 mg/dL — ABNORMAL HIGH (ref 70–99)
Glucose-Capillary: 151 mg/dL — ABNORMAL HIGH (ref 70–99)
Glucose-Capillary: 177 mg/dL — ABNORMAL HIGH (ref 70–99)
Glucose-Capillary: 85 mg/dL (ref 70–99)
Glucose-Capillary: 98 mg/dL (ref 70–99)
Glucose-Capillary: 99 mg/dL (ref 70–99)

## 2018-11-18 MED ORDER — TRAMADOL HCL 50 MG PO TABS
50.0000 mg | ORAL_TABLET | ORAL | Status: DC | PRN
  Administered 2018-11-18 – 2018-11-19 (×3): 50 mg via ORAL
  Filled 2018-11-18 (×3): qty 1

## 2018-11-18 MED ORDER — FUROSEMIDE 10 MG/ML IJ SOLN
40.0000 mg | Freq: Once | INTRAMUSCULAR | Status: AC
  Administered 2018-11-18: 40 mg via INTRAVENOUS
  Filled 2018-11-18: qty 4

## 2018-11-18 MED ORDER — WARFARIN - PHYSICIAN DOSING INPATIENT
Freq: Every day | Status: DC
  Administered 2018-11-20 – 2018-11-21 (×2)

## 2018-11-18 MED ORDER — INSULIN ASPART 100 UNIT/ML ~~LOC~~ SOLN
0.0000 [IU] | SUBCUTANEOUS | Status: DC
  Administered 2018-11-18 (×4): 2 [IU] via SUBCUTANEOUS
  Administered 2018-11-19: 4 [IU] via SUBCUTANEOUS
  Administered 2018-11-19: 2 [IU] via SUBCUTANEOUS

## 2018-11-18 MED ORDER — INSULIN DETEMIR 100 UNIT/ML ~~LOC~~ SOLN
10.0000 [IU] | Freq: Every day | SUBCUTANEOUS | Status: DC
  Filled 2018-11-18: qty 0.1

## 2018-11-18 MED ORDER — INSULIN DETEMIR 100 UNIT/ML ~~LOC~~ SOLN
10.0000 [IU] | Freq: Once | SUBCUTANEOUS | Status: AC
  Administered 2018-11-18: 10 [IU] via SUBCUTANEOUS
  Filled 2018-11-18: qty 0.1

## 2018-11-18 MED ORDER — WARFARIN SODIUM 2.5 MG PO TABS
2.5000 mg | ORAL_TABLET | Freq: Every day | ORAL | Status: DC
  Administered 2018-11-18: 2.5 mg via ORAL
  Filled 2018-11-18: qty 1

## 2018-11-18 MED ORDER — POTASSIUM CHLORIDE CRYS ER 20 MEQ PO TBCR
20.0000 meq | EXTENDED_RELEASE_TABLET | Freq: Once | ORAL | Status: AC
  Administered 2018-11-18: 20 meq via ORAL
  Filled 2018-11-18: qty 1

## 2018-11-18 MED FILL — Sodium Chloride IV Soln 0.9%: INTRAVENOUS | Qty: 2000 | Status: AC

## 2018-11-18 MED FILL — Thrombin (Recombinant) For Soln 20000 Unit: CUTANEOUS | Qty: 1 | Status: AC

## 2018-11-18 MED FILL — Potassium Chloride Inj 2 mEq/ML: INTRAVENOUS | Qty: 40 | Status: AC

## 2018-11-18 MED FILL — Heparin Sodium (Porcine) Inj 1000 Unit/ML: INTRAMUSCULAR | Qty: 30 | Status: AC

## 2018-11-18 MED FILL — Sodium Bicarbonate IV Soln 8.4%: INTRAVENOUS | Qty: 50 | Status: AC

## 2018-11-18 MED FILL — Heparin Sodium (Porcine) Inj 1000 Unit/ML: INTRAMUSCULAR | Qty: 10 | Status: AC

## 2018-11-18 MED FILL — Mannitol IV Soln 20%: INTRAVENOUS | Qty: 500 | Status: AC

## 2018-11-18 MED FILL — Lidocaine HCl(Cardiac) IV PF Soln Pref Syr 100 MG/5ML (2%): INTRAVENOUS | Qty: 5 | Status: AC

## 2018-11-18 MED FILL — Electrolyte-R (PH 7.4) Solution: INTRAVENOUS | Qty: 3000 | Status: AC

## 2018-11-18 MED FILL — Albumin, Human Inj 5%: INTRAVENOUS | Qty: 250 | Status: AC

## 2018-11-18 MED FILL — Magnesium Sulfate Inj 50%: INTRAMUSCULAR | Qty: 10 | Status: AC

## 2018-11-18 NOTE — Progress Notes (Signed)
Patient ID: Adriana [name], female   DOB: 12-22-1945, 72 y.o.   MRN: [id number] TCTS Evening Rounds  Hemodynamically stable in sinus rhythm Chest tubes out  Up in chair for several hours.  Diuresing some.  PM labs pending.  Glucose under adequate control.

## 2018-11-18 NOTE — Progress Notes (Addendum)
TCTS DAILY ICU PROGRESS NOTE                   [address]            [name] [address]          [phone]   1 Day Post-Op Procedure(s) (LRB): CORONARY ARTERY BYPASS GRAFTING (CABG), ON PUMP, TIMES FIVE, USING LEFT INTERNAL MAMMARY ARTERY AND ENDOSCOPICALLY HARVESTED LEFT GREATER SAPHENOUS VEIN (N/A) TRANSESOPHAGEAL ECHOCARDIOGRAM (TEE) (N/A)  Total Length of Stay:  LOS: 1 day   Subjective: She is having some incisional pain this morning.  Objective: Vital signs in last 24 hours: Temp:  [96.4 F (35.8 C)-99.3 F (37.4 C)] 98.6 F (37 C) (12/03 0700) Pulse Rate:  [79-113] 111 (12/03 0700) Resp:  [8-35] 9 (12/03 0700) BP: (90-139)/(48-78) 115/65 (12/03 0700) SpO2:  [95 %-100 %] 95 % (12/03 0700) FiO2 (%):  [40 %-50 %] 40 % (12/02 1802) Weight:  [72.8 kg] 72.8 kg (12/03 0500)  Filed Weights   11/17/18 0542 11/17/18 0614 11/18/18 0500  Weight: 68.5 kg 68.5 kg 72.8 kg    Weight change: 4.307 kg   Hemodynamic parameters for last 24 hours: PAP: (25-44)/(10-28) 32/25 CO:  [3.6 L/min-5.4 L/min] 4.7 L/min CI:  [2 L/min/m2-3 L/min/m2] 2.6 L/min/m2  Intake/Output from previous day: 12/02 0701 - 12/03 0700 In: 4464.7 [I.V.:3068; Blood:300; IV Piggyback:1096.7] Out: 3810 [Urine:2490; Blood:800; Chest Tube:520]  Intake/Output this shift: No intake/output data recorded.  Current Meds: Scheduled Meds: . acetaminophen  1,000 mg Oral Q6H   Or  . acetaminophen (TYLENOL) oral liquid 160 mg/5 mL  1,000 mg Per Tube Q6H  . bisacodyl  10 mg Oral Daily   Or  . bisacodyl  10 mg Rectal Daily  . buPROPion  300 mg Oral Daily  . docusate sodium  200 mg Oral Daily  . insulin regular  0-10 Units Intravenous TID WC  . metoCLOPramide (REGLAN) injection  10 mg Intravenous Q6H  . metoprolol tartrate  12.5 mg Oral BID   Or  . metoprolol tartrate  12.5 mg Per Tube BID  . [START ON 11/19/2018] pantoprazole  40 mg Oral Daily  . simvastatin  40 mg Oral Daily  . sodium  chloride flush  3 mL Intravenous Q12H   Continuous Infusions: . sodium chloride 20 mL/hr at 11/18/18 0700  . sodium chloride Stopped (11/18/18 0545)  . sodium chloride 20 mL/hr (11/17/18 1623)  . albumin human 12.5 g (11/17/18 1450)  . cefUROXime (ZINACEF)  IV Stopped (11/17/18 2018)  . famotidine (PEPCID) IV Stopped (11/17/18 1427)  . insulin 1.1 mL/hr at 11/18/18 0700  . lactated ringers    . lactated ringers    . lactated ringers 20 mL/hr at 11/18/18 0700  . nitroGLYCERIN Stopped (11/17/18 1437)  . phenylephrine (NEO-SYNEPHRINE) Adult infusion     PRN Meds:.sodium chloride, albumin human, lactated ringers, metoprolol tartrate, midazolam, morphine injection, ondansetron (ZOFRAN) IV, oxyCODONE, sodium chloride flush, traMADol  General appearance: alert, cooperative and no distress Heart: sinus tachycardia Lungs: clear to auscultation bilaterally Abdomen: soft, non-tender; bowel sounds normal; no masses,  no organomegaly Extremities: extremities normal, atraumatic, no cyanosis or edema Wound: clean and dry dressed with a sterile dressing  Lab Results: CBC: Recent Labs    11/17/18 2000 11/17/18 2023 11/18/18 0427  WBC 11.8*  --  15.0*  HGB 10.2* 10.5* 11.4*  HCT 32.9* 31.0* 35.6*  PLT 169  --  176   BMET:  Recent Labs  11/17/18 2023 11/18/18 0427  NA 139 137  K 4.7 4.5  CL 104 105  CO2  --  23  GLUCOSE 153* 106*  BUN 14 9  CREATININE 0.80 0.97  CALCIUM  --  7.8*    CMET: Lab Results  Component Value Date   WBC 15.0 (H) 11/18/2018   HGB 11.4 (L) 11/18/2018   HCT 35.6 (L) 11/18/2018   PLT 176 11/18/2018   GLUCOSE 106 (H) 11/18/2018   CHOL 83 05/05/2018   TRIG 93 05/05/2018   HDL 31 (L) 05/05/2018   LDLCALC 33 05/05/2018   ALT 24 11/12/2018   AST 21 11/12/2018   NA 137 11/18/2018   K 4.5 11/18/2018   CL 105 11/18/2018   CREATININE 0.97 11/18/2018   BUN 9 11/18/2018   CO2 23 11/18/2018   INR 1.70 11/17/2018   HGBA1C 8.5 (H) 11/12/2018       PT/INR:  Recent Labs    11/17/18 1429  LABPROT 19.8*  INR 1.70   Radiology: Dg Chest Port 1 View  Result Date: 11/17/2018 CLINICAL DATA:  Post op cabg,, ett, ogt, rt swan, chest tubes EXAM: PORTABLE CHEST 1 VIEW COMPARISON:  None. FINDINGS: Endotracheal tube is adequately positioned with tip located 3-4 cm above the carina. Enteric tube passes below the diaphragm. Swan-Ganz catheter in place with tip at the midline. LEFT-sided chest tube in place with tip directed towards the lung apex. Mediastinal drains are also in place. Probable mild atelectasis and/or small pleural effusion. RIGHT lung appears clear. No pneumothorax appreciated. IMPRESSION: Satisfactory postoperative appearance. Electronically Signed   By: Bary RichardStan  Maynard M.D.   On: 11/17/2018 15:06     Assessment/Plan: S/P Procedure(s) (LRB): CORONARY ARTERY BYPASS GRAFTING (CABG), ON PUMP, TIMES FIVE, USING LEFT INTERNAL MAMMARY ARTERY AND ENDOSCOPICALLY HARVESTED LEFT GREATER SAPHENOUS VEIN (N/A) TRANSESOPHAGEAL ECHOCARDIOGRAM (TEE) (N/A)  1. CV-ST in the low 100s. BP well controlled. Not on any pressors 2. Pulm-extubated, tolerating 4L [place]. CXR is stable. Chest tube output has slowed therefore we will remove the chest tubes today.  3. Renal-creatinine 0.97, electrolytes okay 4. H and H stable at 11.4/35.6, platelets 176k 5. Endo-okay to transition to levemir and SSI. Discontinue insulin gtt.   Plan: see progression orders. Discontinue swan ganz catheter, a-line, chest tubes, discontinue insulin drip. Continue incentive spirometer and work on OOB to the chair today. Wean oxygen as tolerated. Making good progress POD1. Lasix IV x 1 for fluid overload.     5. Continue post-op antibiotics      Sharlene Doryessa N Conte 11/18/2018 7:38 AM   Chart reviewed, patient examined, agree with above. She is doing well postop. No nausea. Plan to removed chest tubes, swan, arterial line and mobilize.

## 2018-11-19 ENCOUNTER — Inpatient Hospital Stay (HOSPITAL_COMMUNITY): Payer: Medicare Other

## 2018-11-19 LAB — CBC
HCT: 33.1 % — ABNORMAL LOW (ref 36.0–46.0)
Hemoglobin: 10.2 g/dL — ABNORMAL LOW (ref 12.0–15.0)
MCH: 29.3 pg (ref 26.0–34.0)
MCHC: 30.8 g/dL (ref 30.0–36.0)
MCV: 95.1 fL (ref 80.0–100.0)
PLATELETS: 146 10*3/uL — AB (ref 150–400)
RBC: 3.48 MIL/uL — ABNORMAL LOW (ref 3.87–5.11)
RDW: 13.6 % (ref 11.5–15.5)
WBC: 16.1 10*3/uL — ABNORMAL HIGH (ref 4.0–10.5)
nRBC: 0 % (ref 0.0–0.2)

## 2018-11-19 LAB — BASIC METABOLIC PANEL
Anion gap: 9 (ref 5–15)
BUN: 16 mg/dL (ref 8–23)
CO2: 25 mmol/L (ref 22–32)
Calcium: 7.6 mg/dL — ABNORMAL LOW (ref 8.9–10.3)
Chloride: 103 mmol/L (ref 98–111)
Creatinine, Ser: 1.15 mg/dL — ABNORMAL HIGH (ref 0.44–1.00)
GFR calc Af Amer: 56 mL/min — ABNORMAL LOW (ref >60)
GFR calc non Af Amer: 48 mL/min — ABNORMAL LOW (ref >60)
Glucose, Bld: 147 mg/dL — ABNORMAL HIGH (ref 70–99)
Potassium: 4.5 mmol/L (ref 3.5–5.1)
Sodium: 137 mmol/L (ref 135–145)

## 2018-11-19 LAB — PROTIME-INR
INR: 1.4
Prothrombin Time: 17 seconds — ABNORMAL HIGH (ref 11.4–15.2)

## 2018-11-19 LAB — GLUCOSE, CAPILLARY
GLUCOSE-CAPILLARY: 195 mg/dL — AB (ref 70–99)
Glucose-Capillary: 166 mg/dL — ABNORMAL HIGH (ref 70–99)
Glucose-Capillary: 184 mg/dL — ABNORMAL HIGH (ref 70–99)
Glucose-Capillary: 218 mg/dL — ABNORMAL HIGH (ref 70–99)

## 2018-11-19 LAB — POTASSIUM: POTASSIUM: 4.6 mmol/L (ref 3.5–5.1)

## 2018-11-19 MED ORDER — INSULIN ASPART 100 UNIT/ML ~~LOC~~ SOLN
0.0000 [IU] | Freq: Three times a day (TID) | SUBCUTANEOUS | Status: DC
  Administered 2018-11-19: 4 [IU] via SUBCUTANEOUS
  Administered 2018-11-19: 8 [IU] via SUBCUTANEOUS
  Administered 2018-11-20: 2 [IU] via SUBCUTANEOUS
  Administered 2018-11-20: 4 [IU] via SUBCUTANEOUS
  Administered 2018-11-20: 2 [IU] via SUBCUTANEOUS
  Administered 2018-11-21: 4 [IU] via SUBCUTANEOUS
  Administered 2018-11-21 – 2018-11-22 (×4): 2 [IU] via SUBCUTANEOUS
  Administered 2018-11-22: 4 [IU] via SUBCUTANEOUS
  Administered 2018-11-22 (×2): 2 [IU] via SUBCUTANEOUS
  Administered 2018-11-23: 8 [IU] via SUBCUTANEOUS
  Administered 2018-11-23: 4 [IU] via SUBCUTANEOUS
  Administered 2018-11-23 – 2018-11-24 (×3): 2 [IU] via SUBCUTANEOUS

## 2018-11-19 MED ORDER — METOPROLOL TARTRATE 25 MG PO TABS
25.0000 mg | ORAL_TABLET | Freq: Two times a day (BID) | ORAL | Status: DC
  Administered 2018-11-19 – 2018-11-24 (×10): 25 mg via ORAL
  Filled 2018-11-19 (×10): qty 1

## 2018-11-19 MED ORDER — AMIODARONE HCL IN DEXTROSE 360-4.14 MG/200ML-% IV SOLN
60.0000 mg/h | INTRAVENOUS | Status: DC
  Administered 2018-11-19 (×2): 60 mg/h via INTRAVENOUS
  Filled 2018-11-19: qty 200

## 2018-11-19 MED ORDER — METOPROLOL TARTRATE 12.5 MG HALF TABLET: 12.5000 mg | ORAL_TABLET | Freq: Two times a day (BID) | ORAL | Status: DC

## 2018-11-19 MED ORDER — POTASSIUM CHLORIDE CRYS ER 20 MEQ PO TBCR
20.0000 meq | EXTENDED_RELEASE_TABLET | Freq: Once | ORAL | Status: AC
  Administered 2018-11-19: 20 meq via ORAL
  Filled 2018-11-19: qty 1

## 2018-11-19 MED ORDER — SODIUM CHLORIDE 0.9% FLUSH: 3.0000 mL | INTRAVENOUS | Status: DC | PRN

## 2018-11-19 MED ORDER — FUROSEMIDE 10 MG/ML IJ SOLN
40.0000 mg | Freq: Once | INTRAMUSCULAR | Status: AC
  Administered 2018-11-19: 40 mg via INTRAVENOUS
  Filled 2018-11-19: qty 4

## 2018-11-19 MED ORDER — AMIODARONE HCL IN DEXTROSE 360-4.14 MG/200ML-% IV SOLN
30.0000 mg/h | INTRAVENOUS | Status: DC
  Filled 2018-11-19: qty 200

## 2018-11-19 MED ORDER — AMIODARONE HCL IN DEXTROSE 360-4.14 MG/200ML-% IV SOLN
INTRAVENOUS | Status: AC
  Administered 2018-11-19: 16:00:00
  Filled 2018-11-19: qty 200

## 2018-11-19 MED ORDER — WARFARIN SODIUM 5 MG PO TABS
5.0000 mg | ORAL_TABLET | Freq: Every day | ORAL | Status: DC
  Administered 2018-11-19 – 2018-11-22 (×4): 5 mg via ORAL
  Filled 2018-11-19 (×4): qty 1

## 2018-11-19 MED ORDER — AMIODARONE LOAD VIA INFUSION
150.0000 mg | Freq: Once | INTRAVENOUS | Status: AC
  Administered 2018-11-19: 150 mg via INTRAVENOUS
  Filled 2018-11-19: qty 83.34

## 2018-11-19 MED ORDER — SODIUM CHLORIDE 0.9% FLUSH
3.0000 mL | Freq: Two times a day (BID) | INTRAVENOUS | Status: DC
  Administered 2018-11-19 – 2018-11-23 (×9): 3 mL via INTRAVENOUS

## 2018-11-19 MED ORDER — INSULIN DETEMIR 100 UNIT/ML ~~LOC~~ SOLN
15.0000 [IU] | Freq: Every day | SUBCUTANEOUS | Status: DC
  Administered 2018-11-19: 15 [IU] via SUBCUTANEOUS
  Filled 2018-11-19 (×2): qty 0.15

## 2018-11-19 MED ORDER — MOVING RIGHT ALONG BOOK
Freq: Once | Status: AC
  Administered 2018-11-19: 12:00:00
  Filled 2018-11-19: qty 1

## 2018-11-19 MED ORDER — PANTOPRAZOLE SODIUM 40 MG PO TBEC
40.0000 mg | DELAYED_RELEASE_TABLET | Freq: Every day | ORAL | Status: DC
  Administered 2018-11-20 – 2018-11-24 (×5): 40 mg via ORAL
  Filled 2018-11-19 (×5): qty 1

## 2018-11-19 MED ORDER — FUROSEMIDE 10 MG/ML IJ SOLN: 40.0000 mg | Freq: Once | INTRAMUSCULAR | Status: DC

## 2018-11-19 MED ORDER — SODIUM CHLORIDE 0.9 % IV SOLN
250.0000 mL | INTRAVENOUS | Status: DC | PRN
  Administered 2018-11-19: 250 mL via INTRAVENOUS

## 2018-11-19 NOTE — Progress Notes (Signed)
1530 [facility] received [facility] HR went up to 250 while on toilet. Pt assisted back to bed. HR sustaining above 200. Laneta SimmersBartle, MD paged. Orders received. Amiodarone bolus started.   1537 Pt placed on Zoll. Pt transported back to 2H. 2H nurses present for transport.   Family updated and [facility] with pt belongings.  Versie StarksHanna  Naraly Fritcher, RN

## 2018-11-19 NOTE — Discharge Instructions (Signed)

## 2018-11-19 NOTE — Discharge Summary (Signed)
[address]       [place] [address]             [phone]      Physician Discharge Summary  Patient ID: Adriana [name] MRN: [id number] DOB/AGE: 70-Dec-1947 70 y.o.  Admit date: 11/17/2018 Discharge date: 11/24/2018  Admission Diagnoses: Patient Active Problem List   Diagnosis Date Noted  . Abnormal nuclear cardiac imaging test 11/04/2018  . Coronary artery disease 05/02/2018    Discharge Diagnoses:  Active Problems:   S/P CABG x 5   Discharged Condition: good  HPI:   The patient is a 72 year old woman with a history of poorly controlled type 2 diabetes, hypertension, hyperlipidemia, factor V Leiden mutation with DVT, cerebrovascular disease and stroke status post bilateral carotid endarterectomy, who presents with a history of progressive exertional shortness of breath and fatigue. She was evaluated by Dr. Excell Seltzer and underwent echocardiogram on 10/29/2018. This showed an ejection fraction of 50 to 55% with grade 1 diastolic dysfunction. There was hypokinesis of the mid anteroseptal, mid inferior septal, mid apical inferior, apical septal, and apical myocardium. A nuclear stress test on 10/29/2018 was high risk study showed an ejection fraction of 30 to 44% with no ST segment changes during stress. There is a medium size defect of moderate severity in the basal inferoseptal, mid inferior septal, apical septal, and apical inferior locations. There is a second severe defect of medium size in the mid anterior, mid anterior septal, apical anterior, apical septal, and apical location consistent with prior myocardial infarction with peri-infarct ischemia in the anterior septal, inferoseptal, and apical regions. Cardiac catheterization on 11/04/2018 showed chronic total occlusion of the right coronary artery which was codominant with left-to-right collaterals from the left circumflex. There is chronic subtotal occlusion of the LAD filling antegrade and also by  collaterals from the right. There is severe diffuse proximal and mid LAD disease with stenosis in the first diagonal branch. There is severe diffuse obtuse marginal stenosis. Left ventricular end-diastolic pressure was mildly elevated at 16 mmHg. Left ventricular ejection fraction was 50 to 55%.   She reports worsening exertional shortness of breath and fatigue over the past few years which is becoming more noticeable over the past 6 months. She is also recently had significant heartburn type lower chest pain which she has never had in the past. She does note some shortness of breath when lying down at night.   She says that her prior DVT and phlebitis have been in the right lower extremity only.   Hospital Course:   On 11/17/2018 Adriana [name] underwent a CABG x 5 with Dr. [name]. She tolerated the procedure well and was transferred to the surgical ICU for continued care. She was extubated in a timely manner. POD 1 she was doping very well. She was on 4L [place] for oxygen support. We were able to remove her chest tubes, swan ganz catheter, and arterial line. We worked on pain and nausea control. She was given IV lasix for fluid overload. She remained hemodynamically stable and in NSR POD 2. She did have episodes of sinus tachycardia but it was mostly in the low 100s. Her oxygen was being weaned as tolerated. She remained fluid overloaded therefore we gave her some IV lasix. She was still not eating much so we encouraged oral intake. She was tolerating clear liquids at this time. She was cleared to be transferred to the telemetry floor for continued care. She continued to progress on  the floor. We weaned her oxygen requirements. Her nausea resolved and she was now tolerating a regular diet. Today, she is ambulating with limited assistance, she is off oxygen and tolerating room air, her incisions are healing well and she is ready for discharge home.    Consults: None  Significant Diagnostic  Studies:  CLINICAL DATA:  CABG  EXAM: PORTABLE CHEST 1 VIEW  COMPARISON:  11/18/2018  FINDINGS: Swan-Ganz catheter removed. Right jugular sheath in the SVC. Left chest tube removed. Mediastinal drain removed. Pericardial drain removed.  Improvement in left lower lobe atelectasis. Negative for edema. Negative for pneumothorax. Cardiac enlargement. Postop CABG.  IMPRESSION: Chest tubes and Swan-Ganz catheter removed  Improvement in left lower lobe atelectasis. Negative for pneumothorax.   Electronically Signed   By: Marlan Palauharles  Clark M.D.   On: 11/19/2018 09:23  Treatments:  CARDIOVASCULAR SURGERY OPERATIVE NOTE  11/17/2018  Surgeon:  Alleen BorneBryan K. Bartle, MD  First Assistant: Jari Favreessa Urie Loughner,  PA-C   Preoperative Diagnosis:  Severe multi-vessel coronary artery disease   Postoperative Diagnosis:  Same   Procedure:  1. Median Sternotomy 2. Extracorporeal circulation 3.   Coronary artery bypass grafting x 5   Left internal mammary graft to the LAD  SVG to diagonal  Sequential SVG to OM1 and OM2  SVG to RCA 4.   Endoscopic vein harvest from the left leg   Anesthesia:  General Endotracheal   Discharge Exam: Blood pressure 120/68, pulse (!) 104, temperature 97.8 F (36.6 C), temperature source Oral, resp. rate 17, height 5\' 6"  (1.676 m), weight 69 kg, SpO2 91 %.    General appearance: alert, cooperative and no distress Heart: regular rate and rhythm, S1, S2 normal, no murmur, click, rub or gallop Lungs: clear to auscultation bilaterally Abdomen: soft, non-tender; bowel sounds normal; no masses,  no organomegaly Extremities: extremities normal, atraumatic, no cyanosis or edema Wound: clean and dry  Disposition: Discharge disposition: 01-Home or Self Care       Discharge Instructions    Amb Referral to Cardiac Rehabilitation   Complete by:  As directed    Diagnosis:  CABG   CABG X ___:  5     Allergies as of 11/24/2018       Reactions   Anesthesia S-i-60 Nausea And Vomiting   Confused BP goes up and down.   Aspirin Nausea And Vomiting      Medication List    STOP taking these medications   chlorthalidone 25 MG tablet Commonly known as:  HYGROTON   simvastatin 40 MG tablet Commonly known as:  ZOCOR   sulfamethoxazole-trimethoprim 800-160 MG tablet Commonly known as:  BACTRIM DS,SEPTRA DS   VOLTAREN 1 % Gel Generic drug:  diclofenac sodium     TAKE these medications   acetaminophen 500 MG tablet Commonly known as:  TYLENOL Take 1 tablet (500 mg total) by mouth every 6 (six) hours as needed for moderate pain (or Fever >/= 101). What changed:    how much to take  reasons to take this   amiodarone 200 MG tablet Commonly known as:  PACERONE Take 1 tablet (200 mg total) by mouth 2 (two) times daily.   ANTACID 500 MG chewable tablet Generic drug:  calcium carbonate Chew 2 tablets by mouth daily as needed for indigestion or heartburn.   atorvastatin 20 MG tablet Commonly known as:  LIPITOR Take 1 tablet (20 mg total) by mouth daily at 6 PM.   BASAGLAR KWIKPEN 100 UNIT/ML Sopn Inject 0.15 mLs (15 Units total)  into the skin at bedtime. What changed:  how much to take   buPROPion 300 MG 24 hr tablet Commonly known as:  WELLBUTRIN XL Take 300 mg by mouth daily.   clobetasol cream 0.05 % Commonly known as:  TEMOVATE Apply 1 application topically daily as needed (for psoriasis).   FREESTYLE LIBRE 14 DAY SENSOR Misc 1 each by Does not apply route every 14 (fourteen) days. Change every 2 weeks   insulin lispro 100 UNIT/ML injection Commonly known as:  HUMALOG Inject 0.06 mLs (6 Units total) into the skin 3 (three) times daily before meals. What changed:  how much to take   lisinopril 5 MG tablet Commonly known as:  PRINIVIL,ZESTRIL Take 5 mg by mouth daily.   metFORMIN 1000 MG tablet Commonly known as:  GLUCOPHAGE Take 1,000 mg by mouth 2 (two) times daily with a meal.   metoprolol  tartrate 25 MG tablet Commonly known as:  LOPRESSOR Take 1 tablet (25 mg total) by mouth 2 (two) times daily. What changed:  how much to take   potassium citrate 10 MEQ (1080 MG) SR tablet Commonly known as:  UROCIT-K Take 10 mEq by mouth 2 (two) times daily.   promethazine 25 MG tablet Commonly known as:  PHENERGAN Take 12.5 mg by mouth every evening.   Vitamin D 50 MCG (2000 UT) Caps Take 2,000 Units by mouth daily.   warfarin 5 MG tablet Commonly known as:  COUMADIN Take 5 mg by mouth every evening.   white petrolatum Gel Commonly known as:  VASELINE Apply 1 application topically daily as needed (for psoriasis).      Follow-up Information    [name], MD. Call in 1 day(s).   Specialty:  Endocrinology Contact information: [address] [employer] [place] [address] [phone]        [name], MD .   Specialty:  Cardiology Contact information: [phone] [address] [place] [address] [phone]        Berton Bon, NP Follow up.   Specialty:  Nurse Practitioner Why:  Berton Bon, NP 12/08/2018 @ 11:30am [facility]  Contact information: [address] [place] [address] [phone]        [facility] Office Follow up.   Specialty:  Cardiology Why:  Coumadin clinic office made aware and they should call you with an appointment time and date.  Contact information: [address], [address] [place] [address] [phone]       Alleen Borne, MD Follow up.   Specialty:  Cardiothoracic Surgery Why:  Your routine follow-up appointment is on 12/30 at 3pm. Please arrive at 2:30pm for a chest xray located at [facility] which is on the first floor of our building. Contact information: [address] [employer] [address] [place] [address] [phone]        suture removal nursing appointment Follow up.   Why:  Your suture removal appointment is on Friday  11/28/2018 at 10:30am.  Contact information: Dr. Sharee Pimple office         The patient has been discharged on:   1.Beta Blocker:  Yes [ yes  ]                              No   [   ]  If No, reason:  2.Ace Inhibitor/ARB: Yes [   ]                                     No  [  no  ]                                     If No, reason: titration of BB  3.Statin:   Yes [ yes  ]                  No  [   ]                  If No, reason:  4.Ecasa:  Yes  [   ]                  No   [ no  ]                  If No, reason: allergy    Signed: Sharlene Dory 11/24/2018, 11:32 AM

## 2018-11-19 NOTE — Progress Notes (Signed)
Patient ID: Maralyn SagoVictoria Leigh Vancott, female   DOB: 1946-03-17, 72 y.o.   MRN: [id number] TCTS Evening Rounds:  Patient transferred to 4E this afternoon and shortly after arriving walked to bathroom with nurse and went into rapid rhythm with wide complex up to 250 while on toilet. Assisted back to bed and broke on her own. I think this was probably atrial fib with RVR and aberrrent conduction. Her ECG after the event was normal. Started on IV amio. Will increase Lopressor to 25 bid. Check K+ this pm since she diuresed well today.

## 2018-11-19 NOTE — Progress Notes (Signed)
TCTS DAILY ICU PROGRESS NOTE                   [address]            [name] [address]          [phone]   2 Days Post-Op Procedure(s) (LRB): CORONARY ARTERY BYPASS GRAFTING (CABG), ON PUMP, TIMES FIVE, USING LEFT INTERNAL MAMMARY ARTERY AND ENDOSCOPICALLY HARVESTED LEFT GREATER SAPHENOUS VEIN (N/A) TRANSESOPHAGEAL ECHOCARDIOGRAM (TEE) (N/A)  Total Length of Stay:  LOS: 2 days   Subjective: Does feel well this morning. She nauseated but not sure if the medication is working  Objective: Vital signs in last 24 hours: Temp:  [98 F (36.7 C)-98.6 F (37 C)] 98.4 F (36.9 C) (12/03 2000) Pulse Rate:  [72-107] 103 (12/04 0700) Cardiac Rhythm: Normal sinus rhythm (12/03 1915) Resp:  [10-23] 16 (12/04 0700) BP: (80-132)/(40-95) 89/57 (12/04 0700) SpO2:  [90 %-97 %] 97 % (12/04 0700) Weight:  [71.5 kg] 71.5 kg (12/04 0500)  Filed Weights   11/17/18 0614 11/18/18 0500 11/19/18 0500  Weight: 68.5 kg 72.8 kg 71.5 kg    Weight change: -1.3 kg   Hemodynamic parameters for last 24 hours: PAP: (29-37)/(20-25) 37/22  Intake/Output from previous day: 12/03 0701 - 12/04 0700 In: 662.1 [P.O.:200; I.V.:270.1; IV Piggyback:192] Out: 2095 [Urine:2035; Chest Tube:60]  Intake/Output this shift: No intake/output data recorded.  Current Meds: Scheduled Meds: . acetaminophen  1,000 mg Oral Q6H   Or  . acetaminophen (TYLENOL) oral liquid 160 mg/5 mL  1,000 mg Per Tube Q6H  . bisacodyl  10 mg Oral Daily   Or  . bisacodyl  10 mg Rectal Daily  . buPROPion  300 mg Oral Daily  . docusate sodium  200 mg Oral Daily  . insulin aspart  0-24 Units Subcutaneous Q4H  . insulin detemir  10 Units Subcutaneous Daily  . metoCLOPramide (REGLAN) injection  10 mg Intravenous Q6H  . metoprolol tartrate  12.5 mg Oral BID   Or  . metoprolol tartrate  12.5 mg Per Tube BID  . pantoprazole  40 mg Oral Daily  . simvastatin  40 mg Oral Daily  . sodium chloride flush  3 mL Intravenous  Q12H  . warfarin  2.5 mg Oral Daily  . Warfarin - Physician Dosing Inpatient   Does not apply q1800   Continuous Infusions: . sodium chloride Stopped (11/18/18 1147)  . sodium chloride Stopped (11/18/18 0545)  . sodium chloride 20 mL/hr (11/17/18 1623)  . cefUROXime (ZINACEF)  IV Stopped (11/18/18 9147)  . lactated ringers    . lactated ringers Stopped (11/18/18 1704)  . nitroGLYCERIN Stopped (11/17/18 1437)  . phenylephrine (NEO-SYNEPHRINE) Adult infusion     PRN Meds:.sodium chloride, metoprolol tartrate, midazolam, morphine injection, ondansetron (ZOFRAN) IV, oxyCODONE, sodium chloride flush, traMADol  General appearance: alert, cooperative and no distress Heart: regular rate and rhythm, S1, S2 normal, no murmur, click, rub or gallop Lungs: clear to auscultation bilaterally Abdomen: soft, non-tender; bowel sounds normal; no masses,  no organomegaly Extremities: 1-2+ nonpitting edema Wound: clean and dry, dressed in a sterile dressing  Lab Results: CBC: Recent Labs    11/18/18 1700 11/18/18 1841 11/19/18 0309  WBC 18.4*  --  16.1*  HGB 11.1* 11.9* 10.2*  HCT 36.9 35.0* 33.1*  PLT 177  --  146*   BMET:  Recent Labs    11/18/18 0427  11/18/18 1841 11/19/18 0309  NA 137  --  138 137  K 4.5  --  5.4* 4.5  CL 105  --  103 103  CO2 23  --   --  25  GLUCOSE 106*  --  144* 147*  BUN 9  --  17 16  CREATININE 0.97   < > 1.10* 1.15*  CALCIUM 7.8*  --   --  7.6*   < > = values in this interval not displayed.    CMET: Lab Results  Component Value Date   WBC 16.1 (H) 11/19/2018   HGB 10.2 (L) 11/19/2018   HCT 33.1 (L) 11/19/2018   PLT 146 (L) 11/19/2018   GLUCOSE 147 (H) 11/19/2018   CHOL 83 05/05/2018   TRIG 93 05/05/2018   HDL 31 (L) 05/05/2018   LDLCALC 33 05/05/2018   ALT 24 11/12/2018   AST 21 11/12/2018   NA 137 11/19/2018   K 4.5 11/19/2018   CL 103 11/19/2018   CREATININE 1.15 (H) 11/19/2018   BUN 16 11/19/2018   CO2 25 11/19/2018   INR 1.70  11/17/2018   HGBA1C 8.5 (H) 11/12/2018      PT/INR:  Recent Labs    11/17/18 1429  LABPROT 19.8*  INR 1.70   Radiology: No results found.   Assessment/Plan: S/P Procedure(s) (LRB): CORONARY ARTERY BYPASS GRAFTING (CABG), ON PUMP, TIMES FIVE, USING LEFT INTERNAL MAMMARY ARTERY AND ENDOSCOPICALLY HARVESTED LEFT GREATER SAPHENOUS VEIN (N/A) TRANSESOPHAGEAL ECHOCARDIOGRAM (TEE) (N/A)   1. NSR in the 90s to ST in the low 100s. Continue low-dose BB. BP well controlled. 2. Pulm-tolerating 1L [place] with good oxygenation. Continue to encourage incentive spirometer. CXR stable 3. Renal-creatinine 1.15, electrolytes okay. Lasix 40mg  x 2 yesterday for fluid overload. Urine output was excellent and fluid balance neg. Remains 3kg over baseline weight.  4. H and H stable at 10.2/33.1, platelets 146k 5. Blood glucose well controlled on levemir and SSI coverage. Increase oral intake as able 6. Nausea-continue zofran, can try something different if needed.   Plan: Continue to work on increasing appetite. Ambulate if able. Encouraged incentive spirometer use. She needs another day or so in the ICU. Will order another Lasix 40mg  IV.     Sharlene Doryessa N Nichalas Coin 11/19/2018 7:34 AM

## 2018-11-19 NOTE — Progress Notes (Signed)
Patient ID: Maralyn SagoVictoria Leigh Plamondon, female   DOB: [birth year], 72 y.o.   MRN: [id number] EVENING ROUNDS NOTE :     [address]       [place] [id number]             [phone]                 2 Days Post-Op Procedure(s) (LRB): CORONARY ARTERY BYPASS GRAFTING (CABG), ON PUMP, TIMES FIVE, USING LEFT INTERNAL MAMMARY ARTERY AND ENDOSCOPICALLY HARVESTED LEFT GREATER SAPHENOUS VEIN (N/A) TRANSESOPHAGEAL ECHOCARDIOGRAM (TEE) (N/A)  Total Length of Stay:  LOS: 2 days  BP (!) 150/85   Pulse (!) 105   Temp 98.2 F (36.8 C) (Oral)   Resp (!) 24   Ht 5\' 6"  (1.676 m)   Wt 71.5 kg   SpO2 98%   BMI 25.44 kg/m   .Intake/Output      12/03 0701 - 12/04 0700 12/04 0701 - 12/05 0700   P.O. 200    I.V. (mL/kg) 270.1 (3.8)    Blood     IV Piggyback 192 97.8   Total Intake(mL/kg) 662.1 (9.3) 97.8 (1.4)   Urine (mL/kg/hr) 2035 (1.2) 950 (1.2)   Blood     Chest Tube 60    Total Output 2095 950   Net -1432.9 -852.2          . sodium chloride 250 mL (11/19/18 1811)  . amiodarone 60 mg/hr (11/19/18 1810)   Followed by  . amiodarone       Lab Results  Component Value Date   WBC 16.1 (H) 11/19/2018   HGB 10.2 (L) 11/19/2018   HCT 33.1 (L) 11/19/2018   PLT 146 (L) 11/19/2018   GLUCOSE 147 (H) 11/19/2018   CHOL 83 05/05/2018   TRIG 93 05/05/2018   HDL 31 (L) 05/05/2018   LDLCALC 33 05/05/2018   ALT 24 11/12/2018   AST 21 11/12/2018   NA 137 11/19/2018   K 4.5 11/19/2018   CL 103 11/19/2018   CREATININE 1.15 (H) 11/19/2018   BUN 16 11/19/2018   CO2 25 11/19/2018   INR 1.40 11/19/2018   HGBA1C 8.5 (H) 11/12/2018   Now back in unit, in sinus Restarted  On coumadin today   Delight OvensEdward B Antoni Stefan MD  Beeper [phone] Office [phone] 11/19/2018 6:15 PM

## 2018-11-19 NOTE — Progress Notes (Addendum)
2 Days Post-Op Procedure(s) (LRB): CORONARY ARTERY BYPASS GRAFTING (CABG), ON PUMP, TIMES FIVE, USING LEFT INTERNAL MAMMARY ARTERY AND ENDOSCOPICALLY HARVESTED LEFT GREATER SAPHENOUS VEIN (N/A) TRANSESOPHAGEAL ECHOCARDIOGRAM (TEE) (N/A) Subjective: Some nausea but better than in the past after surgery  Objective: Vital signs in last 24 hours: Temp:  [98 F (36.7 C)-99.4 F (37.4 C)] 99.4 F (37.4 C) (12/04 0800) Pulse Rate:  [72-107] 101 (12/04 0808) Cardiac Rhythm: Normal sinus rhythm (12/03 1915) Resp:  [10-19] 16 (12/04 0700) BP: (80-132)/(40-95) 115/52 (12/04 0808) SpO2:  [90 %-97 %] 97 % (12/04 0700) Weight:  [71.5 kg] 71.5 kg (12/04 0500)  Hemodynamic parameters for last 24 hours: PAP: (32-37)/(22-25) 37/22  Intake/Output from previous day: 12/03 0701 - 12/04 0700 In: 662.1 [P.O.:200; I.V.:270.1; IV Piggyback:192] Out: 2095 [Urine:2035; Chest Tube:60] Intake/Output this shift: No intake/output data recorded.  General appearance: alert and cooperative Neurologic: intact Heart: regular rate and rhythm, S1, S2 normal, no murmur, click, rub or gallop Lungs: clear to auscultation bilaterally Abdomen: soft, non-tender; bowel sounds normal Extremities: edema mild Wound: dressings dry  Lab Results: Recent Labs    11/18/18 1700 11/18/18 1841 11/19/18 0309  WBC 18.4*  --  16.1*  HGB 11.1* 11.9* 10.2*  HCT 36.9 35.0* 33.1*  PLT 177  --  146*   BMET:  Recent Labs    11/18/18 0427  11/18/18 1841 11/19/18 0309  NA 137  --  138 137  K 4.5  --  5.4* 4.5  CL 105  --  103 103  CO2 23  --   --  25  GLUCOSE 106*  --  144* 147*  BUN 9  --  17 16  CREATININE 0.97   < > 1.10* 1.15*  CALCIUM 7.8*  --   --  7.6*   < > = values in this interval not displayed.    PT/INR:  Recent Labs    11/17/18 1429  LABPROT 19.8*  INR 1.70   ABG    Component Value Date/Time   PHART 7.309 (L) 11/17/2018 2027   HCO3 24.2 11/17/2018 2027   TCO2 24 11/18/2018 1841   ACIDBASEDEF  2.0 11/17/2018 2027   O2SAT 98.0 11/17/2018 2027   CBG (last 3)  Recent Labs    11/18/18 2125 11/18/18 2357 11/19/18 0756  GLUCAP 151* 129* 166*   CXR: mild bibasilar atelectasis  Assessment/Plan: S/P Procedure(s) (LRB): CORONARY ARTERY BYPASS GRAFTING (CABG), ON PUMP, TIMES FIVE, USING LEFT INTERNAL MAMMARY ARTERY AND ENDOSCOPICALLY HARVESTED LEFT GREATER SAPHENOUS VEIN (N/A) TRANSESOPHAGEAL ECHOCARDIOGRAM (TEE) (N/A)  POD 2  Hemodynamically stable in sinus rhythm. Continue low dose beta blocker.  Volume excess: wt down 3 lbs from yesterday. Continue diuresis.  DM: glucose under adequate control. Continue Levemir and SSI.  Postop nausea: diabetic gastroparesis. Continue IV reglan for now.  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 INR not drawn this am so need to followup on that. She is allergic to ASA so will not use ASA or Plavix with Coumadin.  Transfer to 4E.   LOS: 2 days    Alleen BorneBryan K Bartle 11/19/2018

## 2018-11-20 ENCOUNTER — Other Ambulatory Visit: Payer: Self-pay

## 2018-11-20 LAB — CBC
HCT: 29.7 % — ABNORMAL LOW (ref 36.0–46.0)
Hemoglobin: 9.5 g/dL — ABNORMAL LOW (ref 12.0–15.0)
MCH: 29.7 pg (ref 26.0–34.0)
MCHC: 32 g/dL (ref 30.0–36.0)
MCV: 92.8 fL (ref 80.0–100.0)
Platelets: 148 10*3/uL — ABNORMAL LOW (ref 150–400)
RBC: 3.2 MIL/uL — ABNORMAL LOW (ref 3.87–5.11)
RDW: 13.4 % (ref 11.5–15.5)
WBC: 11.9 10*3/uL — ABNORMAL HIGH (ref 4.0–10.5)
nRBC: 0 % (ref 0.0–0.2)

## 2018-11-20 LAB — BASIC METABOLIC PANEL
Anion gap: 11 (ref 5–15)
BUN: 13 mg/dL (ref 8–23)
CO2: 29 mmol/L (ref 22–32)
Calcium: 8.3 mg/dL — ABNORMAL LOW (ref 8.9–10.3)
Chloride: 95 mmol/L — ABNORMAL LOW (ref 98–111)
Creatinine, Ser: 0.82 mg/dL (ref 0.44–1.00)
GFR calc Af Amer: >60 mL/min (ref >60)
GFR calc non Af Amer: >60 mL/min (ref >60)
Glucose, Bld: 192 mg/dL — ABNORMAL HIGH (ref 70–99)
Potassium: 4.3 mmol/L (ref 3.5–5.1)
Sodium: 135 mmol/L (ref 135–145)

## 2018-11-20 LAB — PROTIME-INR
INR: 1.21
Prothrombin Time: 15.1 seconds (ref 11.4–15.2)

## 2018-11-20 LAB — GLUCOSE, CAPILLARY
GLUCOSE-CAPILLARY: 182 mg/dL — AB (ref 70–99)
Glucose-Capillary: 180 mg/dL — ABNORMAL HIGH (ref 70–99)
Glucose-Capillary: 122 mg/dL — ABNORMAL HIGH (ref 70–99)
Glucose-Capillary: 157 mg/dL — ABNORMAL HIGH (ref 70–99)

## 2018-11-20 MED ORDER — AMIODARONE HCL 200 MG PO TABS
200.0000 mg | ORAL_TABLET | Freq: Two times a day (BID) | ORAL | Status: DC
  Administered 2018-11-20 – 2018-11-24 (×9): 200 mg via ORAL
  Filled 2018-11-20 (×9): qty 1

## 2018-11-20 MED ORDER — FUROSEMIDE 10 MG/ML IJ SOLN
40.0000 mg | Freq: Once | INTRAMUSCULAR | Status: AC
  Administered 2018-11-20: 40 mg via INTRAVENOUS
  Filled 2018-11-20: qty 4

## 2018-11-20 MED ORDER — PROMETHAZINE HCL 25 MG PO TABS: 12.5000 mg | ORAL_TABLET | Freq: Three times a day (TID) | ORAL | Status: DC | PRN

## 2018-11-20 MED ORDER — POTASSIUM CHLORIDE 20 MEQ PO PACK
20.0000 meq | PACK | Freq: Once | ORAL | Status: DC
  Filled 2018-11-20: qty 1

## 2018-11-20 MED ORDER — POTASSIUM CHLORIDE CRYS ER 20 MEQ PO TBCR
20.0000 meq | EXTENDED_RELEASE_TABLET | Freq: Once | ORAL | Status: AC
  Administered 2018-11-20: 20 meq via ORAL
  Filled 2018-11-20: qty 1

## 2018-11-20 MED ORDER — INSULIN DETEMIR 100 UNIT/ML ~~LOC~~ SOLN
20.0000 [IU] | Freq: Every day | SUBCUTANEOUS | Status: DC
  Administered 2018-11-20 – 2018-11-23 (×4): 20 [IU] via SUBCUTANEOUS
  Filled 2018-11-20 (×5): qty 0.2

## 2018-11-20 MED ORDER — ACETAMINOPHEN 500 MG PO TABS
1000.0000 mg | ORAL_TABLET | Freq: Four times a day (QID) | ORAL | Status: DC | PRN
  Administered 2018-11-20 – 2018-11-24 (×11): 1000 mg via ORAL
  Filled 2018-11-20 (×11): qty 2

## 2018-11-20 MED FILL — Medication: Qty: 1 | Status: AC

## 2018-11-20 NOTE — Progress Notes (Addendum)
[facility] BRIEF SICU PROGRESS NOTE  3 Days Post-Op  S/P Procedure(s) (LRB): CORONARY ARTERY BYPASS GRAFTING (CABG), ON PUMP, TIMES FIVE, USING LEFT INTERNAL MAMMARY ARTERY AND ENDOSCOPICALLY HARVESTED LEFT GREATER SAPHENOUS VEIN (N/A) TRANSESOPHAGEAL ECHOCARDIOGRAM (TEE) (N/A)   Stable day NSR w/ stable BP Breathing comfortably on 2 L/min UOP not recorded CBG's improved  Plan: Continue current plan  Purcell Nailslarence H Owen, MD 11/20/2018 6:19 PM

## 2018-11-20 NOTE — Progress Notes (Signed)
3 Days Post-Op Procedure(s) (LRB): CORONARY ARTERY BYPASS GRAFTING (CABG), ON PUMP, TIMES FIVE, USING LEFT INTERNAL MAMMARY ARTERY AND ENDOSCOPICALLY HARVESTED LEFT GREATER SAPHENOUS VEIN (N/A) TRANSESOPHAGEAL ECHOCARDIOGRAM (TEE) (N/A) Subjective: Some nausea overnight and this am. No vomiting. No flatus. No BM yet  Objective: Vital signs in last 24 hours: Temp:  [98.2 F (36.8 C)-99.4 F (37.4 C)] 98.3 F (36.8 C) (12/05 0630) Pulse Rate:  [89-114] 96 (12/05 0530) Cardiac Rhythm: Normal sinus rhythm (12/05 0434) Resp:  [16-28] 26 (12/05 0600) BP: (90-150)/(48-85) 103/59 (12/05 0600) SpO2:  [94 %-98 %] 95 % (12/05 0530) Weight:  [72.2 kg] 72.2 kg (12/05 0500)  Hemodynamic parameters for last 24 hours:    Intake/Output from previous day: 12/04 0701 - 12/05 0700 In: 543.3 [I.V.:445.5; IV Piggyback:97.8] Out: 1220 [Urine:1220] Intake/Output this shift: No intake/output data recorded.  General appearance: alert and cooperative Heart: regular rate and rhythm, S1, S2 normal, no murmur, click, rub or gallop Lungs: clear to auscultation bilaterally Abdomen: soft, non-tender; bowel sounds normal Extremities: edema mild Wound: incisions ok  Lab Results: Recent Labs    11/19/18 0309 11/20/18 0453  WBC 16.1* 11.9*  HGB 10.2* 9.5*  HCT 33.1* 29.7*  PLT 146* 148*   BMET:  Recent Labs    11/19/18 0309 11/19/18 1849 11/20/18 0403  NA 137  --  135  K 4.5 4.6 4.3  CL 103  --  95*  CO2 25  --  29  GLUCOSE 147*  --  192*  BUN 16  --  13  CREATININE 1.15*  --  0.82  CALCIUM 7.6*  --  8.3*    PT/INR:  Recent Labs    11/20/18 0453  LABPROT 15.1  INR 1.21   ABG    Component Value Date/Time   PHART 7.309 (L) 11/17/2018 2027   HCO3 24.2 11/17/2018 2027   TCO2 24 11/18/2018 1841   ACIDBASEDEF 2.0 11/17/2018 2027   O2SAT 98.0 11/17/2018 2027   CBG (last 3)  Recent Labs    11/19/18 1709 11/19/18 2216 11/20/18 0626  GLUCAP 195* 218* 182*     Assessment/Plan: S/P Procedure(s) (LRB): CORONARY ARTERY BYPASS GRAFTING (CABG), ON PUMP, TIMES FIVE, USING LEFT INTERNAL MAMMARY ARTERY AND ENDOSCOPICALLY HARVESTED LEFT GREATER SAPHENOUS VEIN (N/A) TRANSESOPHAGEAL ECHOCARDIOGRAM (TEE) (N/A)  Hemodynamically stable in sinus rhythm. No further atrial fib on IV amio. Continue Lopressor and switch amio to po.  DM: glucose up to 218. Will increase Levemir and continue SSI. Not taking much po yet due to nausea.  Nausea: chronic problem for her probably due to diabetic gastroparesis. Continue IV Reglan, add prn phenergan which she takes at home for nausea, Zofran prn. Will discontinue narcotics and use tylenol for pain. Amio make contribute but will continue for now.  Continue diuresis.  Ambulate, IS.  Factor V Leiden def: continue Coumadin.   LOS: 3 days    Adriana [name] 11/20/2018

## 2018-11-21 LAB — BASIC METABOLIC PANEL
Anion gap: 11 (ref 5–15)
BUN: 13 mg/dL (ref 8–23)
CHLORIDE: 98 mmol/L (ref 98–111)
CO2: 32 mmol/L (ref 22–32)
Calcium: 8.6 mg/dL — ABNORMAL LOW (ref 8.9–10.3)
Creatinine, Ser: 0.85 mg/dL (ref 0.44–1.00)
GFR calc Af Amer: >60 mL/min (ref >60)
GFR calc non Af Amer: >60 mL/min (ref >60)
Glucose, Bld: 115 mg/dL — ABNORMAL HIGH (ref 70–99)
Potassium: 3.6 mmol/L (ref 3.5–5.1)
Sodium: 141 mmol/L (ref 135–145)

## 2018-11-21 LAB — PROTIME-INR
INR: 1.18
Prothrombin Time: 14.9 seconds (ref 11.4–15.2)

## 2018-11-21 LAB — GLUCOSE, CAPILLARY
GLUCOSE-CAPILLARY: 139 mg/dL — AB (ref 70–99)
Glucose-Capillary: 134 mg/dL — ABNORMAL HIGH (ref 70–99)
Glucose-Capillary: 147 mg/dL — ABNORMAL HIGH (ref 70–99)
Glucose-Capillary: 188 mg/dL — ABNORMAL HIGH (ref 70–99)

## 2018-11-21 MED ORDER — METOCLOPRAMIDE HCL 10 MG PO TABS
10.0000 mg | ORAL_TABLET | Freq: Three times a day (TID) | ORAL | Status: DC
  Administered 2018-11-21 – 2018-11-24 (×9): 10 mg via ORAL
  Filled 2018-11-21 (×9): qty 1

## 2018-11-21 MED ORDER — ATORVASTATIN CALCIUM 10 MG PO TABS
20.0000 mg | ORAL_TABLET | Freq: Every day | ORAL | Status: DC
  Administered 2018-11-22 – 2018-11-23 (×2): 20 mg via ORAL
  Filled 2018-11-21 (×2): qty 2

## 2018-11-21 MED ORDER — POTASSIUM CHLORIDE CRYS ER 20 MEQ PO TBCR
20.0000 meq | EXTENDED_RELEASE_TABLET | ORAL | Status: DC | PRN
  Administered 2018-11-21 (×2): 20 meq via ORAL
  Filled 2018-11-21 (×3): qty 1

## 2018-11-21 NOTE — Progress Notes (Signed)
4 Days Post-Op Procedure(s) (LRB): CORONARY ARTERY BYPASS GRAFTING (CABG), ON PUMP, TIMES FIVE, USING LEFT INTERNAL MAMMARY ARTERY AND ENDOSCOPICALLY HARVESTED LEFT GREATER SAPHENOUS VEIN (N/A) TRANSESOPHAGEAL ECHOCARDIOGRAM (TEE) (N/A) Subjective: No complaints. Ambulating better. Eating some. More alert without narcotics.  Objective: Vital signs in last 24 hours: Temp:  [97.7 F (36.5 C)-98.7 F (37.1 C)] 97.7 F (36.5 C) (12/06 0800) Pulse Rate:  [80-111] 80 (12/06 1100) Cardiac Rhythm: Sinus tachycardia (12/06 0800) Resp:  [14-22] 14 (12/06 1100) BP: (93-130)/(46-75) 118/46 (12/06 1100) SpO2:  [85 %-100 %] 99 % (12/06 1100) Weight:  [69.4 kg] 69.4 kg (12/06 0500)  Hemodynamic parameters for last 24 hours:    Intake/Output from previous day: 12/05 0701 - 12/06 0700 In: 684.8 [P.O.:580; I.V.:92.8] Out: -  Intake/Output this shift: Total I/O In: 120 [P.O.:120] Out: -   General appearance: alert and cooperative Heart: regular rate and rhythm, S1, S2 normal, no murmur, click, rub or gallop Lungs: clear to auscultation bilaterally Extremities: edema mild Wound: incision ok  Lab Results: Recent Labs    11/19/18 0309 11/20/18 0453  WBC 16.1* 11.9*  HGB 10.2* 9.5*  HCT 33.1* 29.7*  PLT 146* 148*   BMET:  Recent Labs    11/20/18 0403 11/21/18 0407  NA 135 141  K 4.3 3.6  CL 95* 98  CO2 29 32  GLUCOSE 192* 115*  BUN 13 13  CREATININE 0.82 0.85  CALCIUM 8.3* 8.6*    PT/INR:  Recent Labs    11/21/18 0407  LABPROT 14.9  INR 1.18   ABG    Component Value Date/Time   PHART 7.309 (L) 11/17/2018 2027   HCO3 24.2 11/17/2018 2027   TCO2 24 11/18/2018 1841   ACIDBASEDEF 2.0 11/17/2018 2027   O2SAT 98.0 11/17/2018 2027   CBG (last 3)  Recent Labs    11/20/18 1622 11/20/18 2106 11/21/18 0637  GLUCAP 122* 157* 134*    Assessment/Plan: S/P Procedure(s) (LRB): CORONARY ARTERY BYPASS GRAFTING (CABG), ON PUMP, TIMES FIVE, USING LEFT INTERNAL MAMMARY  ARTERY AND ENDOSCOPICALLY HARVESTED LEFT GREATER SAPHENOUS VEIN (N/A) TRANSESOPHAGEAL ECHOCARDIOGRAM (TEE) (N/A)  POD 4  Hemodynamically stable in sinus rhythm on Lopressor and oral amiodarone.  DM: glucose under good control. Continue Levemir and SSI.   Nausea: due to gastroparesis. Improved. Continue oral Reglan and prn Zofran and Phenergan.  Factor V Leiden def: continue Coumadin 5 mg daily. INR has not bumped yet but that was home dose without amio.  Will transfer to 4E and continue mobilization.  LOS: 4 days    Alleen BorneBryan K Nash Bolls 11/21/2018

## 2018-11-21 NOTE — Progress Notes (Signed)
      [address]       [place], [id number]             [phone]      POD # 4 CABG  Stable day  BP 107/66   Pulse 90   Temp 97.9 F (36.6 C) (Oral)   Resp 17   Ht 5\' 6"  (1.676 m)   Wt 69.4 kg   SpO2 95%   BMI 24.69 kg/m   Intake/Output Summary (Last 24 hours) at 11/21/2018 1703 Last data filed at 11/21/2018 1600 Gross per 24 hour  Intake 952 ml  Output -  Net 952 ml   Awaiting tele bed  Viviann SpareSteven C. Dorris FetchHendrickson, MD [facility] [phone]

## 2018-11-22 LAB — BASIC METABOLIC PANEL
Anion gap: 11 (ref 5–15)
BUN: 12 mg/dL (ref 8–23)
CO2: 33 mmol/L — ABNORMAL HIGH (ref 22–32)
Calcium: 9 mg/dL (ref 8.9–10.3)
Chloride: 97 mmol/L — ABNORMAL LOW (ref 98–111)
Creatinine, Ser: 0.79 mg/dL (ref 0.44–1.00)
GFR calc Af Amer: >60 mL/min (ref >60)
GFR calc non Af Amer: >60 mL/min (ref >60)
GLUCOSE: 153 mg/dL — AB (ref 70–99)
Potassium: 3.7 mmol/L (ref 3.5–5.1)
Sodium: 141 mmol/L (ref 135–145)

## 2018-11-22 LAB — GLUCOSE, CAPILLARY
Glucose-Capillary: 137 mg/dL — ABNORMAL HIGH (ref 70–99)
Glucose-Capillary: 180 mg/dL — ABNORMAL HIGH (ref 70–99)
Glucose-Capillary: 152 mg/dL — ABNORMAL HIGH (ref 70–99)
Glucose-Capillary: 157 mg/dL — ABNORMAL HIGH (ref 70–99)

## 2018-11-22 LAB — CBC
HCT: 32 % — ABNORMAL LOW (ref 36.0–46.0)
Hemoglobin: 10.1 g/dL — ABNORMAL LOW (ref 12.0–15.0)
MCH: 29.5 pg (ref 26.0–34.0)
MCHC: 31.6 g/dL (ref 30.0–36.0)
MCV: 93.6 fL (ref 80.0–100.0)
Platelets: 241 10*3/uL (ref 150–400)
RBC: 3.42 MIL/uL — ABNORMAL LOW (ref 3.87–5.11)
RDW: 13.2 % (ref 11.5–15.5)
WBC: 8.2 10*3/uL (ref 4.0–10.5)
nRBC: 0 % (ref 0.0–0.2)

## 2018-11-22 LAB — PROTIME-INR
INR: 1.4
Prothrombin Time: 17 seconds — ABNORMAL HIGH (ref 11.4–15.2)

## 2018-11-22 MED ORDER — POTASSIUM CHLORIDE CRYS ER 20 MEQ PO TBCR
20.0000 meq | EXTENDED_RELEASE_TABLET | ORAL | Status: DC | PRN
  Administered 2018-11-22: 20 meq via ORAL

## 2018-11-22 NOTE — Plan of Care (Signed)
  Problem: Health Behavior/Discharge Planning: Goal: Ability to manage health-related needs will improve Outcome: Progressing   Problem: Activity: Goal: Risk for activity intolerance will decrease Outcome: Progressing   Problem: Nutrition: Goal: Adequate nutrition will be maintained Outcome: Progressing   Problem: Coping: Goal: Level of anxiety will decrease Outcome: Progressing   Problem: Elimination: Goal: Will not experience complications related to bowel motility Outcome: Progressing Goal: Will not experience complications related to urinary retention Outcome: Progressing   Problem: Pain Managment: Goal: General experience of comfort will improve Outcome: Progressing   Problem: Safety: Goal: Ability to remain free from injury will improve Outcome: Progressing   Problem: Skin Integrity: Goal: Risk for impaired skin integrity will decrease Outcome: Progressing   Problem: Urinary Elimination: Goal: Ability to achieve and maintain adequate renal perfusion and functioning will improve Outcome: Progressing   Problem: Skin Integrity: Goal: Wound healing without signs and symptoms of infection Outcome: Progressing Goal: Risk for impaired skin integrity will decrease Outcome: Progressing   Problem: Respiratory: Goal: Respiratory status will improve Outcome: Progressing   Problem: Cardiac: Goal: Will achieve and/or maintain hemodynamic stability Outcome: Progressing

## 2018-11-22 NOTE — Progress Notes (Addendum)
5 Days Post-Op Procedure(s) (LRB): CORONARY ARTERY BYPASS GRAFTING (CABG), ON PUMP, TIMES FIVE, USING LEFT INTERNAL MAMMARY ARTERY AND ENDOSCOPICALLY HARVESTED LEFT GREATER SAPHENOUS VEIN (N/A) TRANSESOPHAGEAL ECHOCARDIOGRAM (TEE) (N/A) Subjective: No complaints this AM Nausea better, pain controlled with Tylenol  Objective: Vital signs in last 24 hours: Temp:  [97.6 F (36.4 C)-98.6 F (37 C)] 98.3 F (36.8 C) (12/07 0706) Pulse Rate:  [79-103] 95 (12/07 0706) Cardiac Rhythm: Sinus tachycardia (12/07 0000) Resp:  [14-24] 18 (12/07 0706) BP: (100-146)/(46-79) 129/65 (12/07 0706) SpO2:  [82 %-100 %] 92 % (12/07 0706) Weight:  [69.3 kg] 69.3 kg (12/07 0500)  Hemodynamic parameters for last 24 hours:    Intake/Output from previous day: 12/06 0701 - 12/07 0700 In: 810 [P.O.:800] Out: -  Intake/Output this shift: No intake/output data recorded.  General appearance: alert, cooperative and no distress Neurologic: intact Heart: regular rate and rhythm Lungs: diminished breath sounds bibasilar Abdomen soft, + BS  Lab Results: Recent Labs    11/20/18 0453 11/22/18 0641  WBC 11.9* 8.2  HGB 9.5* 10.1*  HCT 29.7* 32.0*  PLT 148* 241   BMET:  Recent Labs    11/20/18 0403 11/21/18 0407  NA 135 141  K 4.3 3.6  CL 95* 98  CO2 29 32  GLUCOSE 192* 115*  BUN 13 13  CREATININE 0.82 0.85  CALCIUM 8.3* 8.6*    PT/INR:  Recent Labs    11/22/18 0641  LABPROT 17.0*  INR 1.40   ABG    Component Value Date/Time   PHART 7.309 (L) 11/17/2018 2027   HCO3 24.2 11/17/2018 2027   TCO2 24 11/18/2018 1841   ACIDBASEDEF 2.0 11/17/2018 2027   O2SAT 98.0 11/17/2018 2027   CBG (last 3)  Recent Labs    11/21/18 1606 11/21/18 2151 11/22/18 0714  GLUCAP 147* 188* 137*    Assessment/Plan: S/P Procedure(s) (LRB): CORONARY ARTERY BYPASS GRAFTING (CABG), ON PUMP, TIMES FIVE, USING LEFT INTERNAL MAMMARY ARTERY AND ENDOSCOPICALLY HARVESTED LEFT GREATER SAPHENOUS VEIN  (N/A) TRANSESOPHAGEAL ECHOCARDIOGRAM (TEE) (N/A) -  POD # 5 CABG In SR on amiodarone INR up slightly, continue warfarin Nausea improved Continue cardiac rehab Awaiting tele bed   LOS: 5 days    Loreli SlotSteven C Burdette Forehand 11/22/2018

## 2018-11-22 NOTE — Progress Notes (Signed)
Patient transferred from 2 Heart to 4 East room 19, with daughter and husband at the bedside and all personal belongings. Telemetry applied, CCMD notified and verified X2. Will continue to monitor.

## 2018-11-23 LAB — CBC
HCT: 33.3 % — ABNORMAL LOW (ref 36.0–46.0)
Hemoglobin: 10.2 g/dL — ABNORMAL LOW (ref 12.0–15.0)
MCH: 28.7 pg (ref 26.0–34.0)
MCHC: 30.6 g/dL (ref 30.0–36.0)
MCV: 93.8 fL (ref 80.0–100.0)
Platelets: 309 10*3/uL (ref 150–400)
RBC: 3.55 MIL/uL — ABNORMAL LOW (ref 3.87–5.11)
RDW: 13.2 % (ref 11.5–15.5)
WBC: 8.3 10*3/uL (ref 4.0–10.5)
nRBC: 0.4 % — ABNORMAL HIGH (ref 0.0–0.2)

## 2018-11-23 LAB — GLUCOSE, CAPILLARY
Glucose-Capillary: 133 mg/dL — ABNORMAL HIGH (ref 70–99)
Glucose-Capillary: 167 mg/dL — ABNORMAL HIGH (ref 70–99)
Glucose-Capillary: 137 mg/dL — ABNORMAL HIGH (ref 70–99)
Glucose-Capillary: 217 mg/dL — ABNORMAL HIGH (ref 70–99)

## 2018-11-23 LAB — BASIC METABOLIC PANEL
Anion gap: 13 (ref 5–15)
BUN: 13 mg/dL (ref 8–23)
CALCIUM: 9.3 mg/dL (ref 8.9–10.3)
CO2: 31 mmol/L (ref 22–32)
CREATININE: 0.77 mg/dL (ref 0.44–1.00)
Chloride: 96 mmol/L — ABNORMAL LOW (ref 98–111)
GFR calc Af Amer: >60 mL/min (ref >60)
GFR calc non Af Amer: >60 mL/min (ref >60)
Glucose, Bld: 159 mg/dL — ABNORMAL HIGH (ref 70–99)
Potassium: 3.7 mmol/L (ref 3.5–5.1)
Sodium: 140 mmol/L (ref 135–145)

## 2018-11-23 LAB — PROTIME-INR
INR: 1.39
PROTHROMBIN TIME: 16.9 s — AB (ref 11.4–15.2)

## 2018-11-23 MED ORDER — WARFARIN SODIUM 7.5 MG PO TABS
7.5000 mg | ORAL_TABLET | Freq: Every day | ORAL | Status: DC
  Administered 2018-11-23: 7.5 mg via ORAL
  Filled 2018-11-23: qty 1

## 2018-11-23 MED ORDER — LISINOPRIL 5 MG PO TABS
5.0000 mg | ORAL_TABLET | Freq: Every day | ORAL | Status: DC
  Administered 2018-11-23 – 2018-11-24 (×2): 5 mg via ORAL
  Filled 2018-11-23 (×2): qty 1

## 2018-11-23 NOTE — Progress Notes (Addendum)
      [address]       [place] [id number]             [phone]        6 Days Post-Op Procedure(s) (LRB): CORONARY ARTERY BYPASS GRAFTING (CABG), ON PUMP, TIMES FIVE, USING LEFT INTERNAL MAMMARY ARTERY AND ENDOSCOPICALLY HARVESTED LEFT GREATER SAPHENOUS VEIN (N/A) TRANSESOPHAGEAL ECHOCARDIOGRAM (TEE) (N/A)  Subjective: Patient states nausea and breathing are better  Objective: Vital signs in last 24 hours: Temp:  [97.8 F (36.6 C)-98.2 F (36.8 C)] 97.9 F (36.6 C) (12/08 0423) Pulse Rate:  [91-107] 92 (12/08 0423) Cardiac Rhythm: Normal sinus rhythm (12/07 1900) Resp:  [13-22] 19 (12/08 0423) BP: (105-142)/(58-70) 137/61 (12/08 0423) SpO2:  [90 %-98 %] 90 % (12/08 0423) Weight:  [68.9 kg] 68.9 kg (12/08 0236)  Pre op weight 68.5 kg Current Weight  11/23/18 68.9 kg      Intake/Output from previous day: 12/07 0701 - 12/08 0700 In: 1200 [P.O.:1200] Out: -    Physical Exam:  Cardiovascular: RRR Pulmonary: Slightly diminished at bases Abdomen: Soft, non tender, bowel sounds present. Extremities:No lower extremity edema. Wounds: Clean and dry.  No erythema or signs of infection.  Lab Results: CBC: Recent Labs    11/22/18 0641 11/23/18 0402  WBC 8.2 8.3  HGB 10.1* 10.2*  HCT 32.0* 33.3*  PLT 241 309   BMET:  Recent Labs    11/22/18 0641 11/23/18 0402  NA 141 140  K 3.7 3.7  CL 97* 96*  CO2 33* 31  GLUCOSE 153* 159*  BUN 12 13  CREATININE 0.79 0.77  CALCIUM 9.0 9.3    PT/INR:  Lab Results  Component Value Date   INR 1.39 11/23/2018   INR 1.40 11/22/2018   INR 1.18 11/21/2018   ABG:  INR: Will add last result for INR, ABG once components are confirmed Will add last 4 CBG results once components are confirmed  Assessment/Plan:  1. CV - Previous a fib. SR and hypertensive this am. On Amiodarone 200 mg bid, Lopressor 25 mg bid, and Coumadin. INR this am 1.39. Will increase Coumadin (on for Factor V Leiden deficiency).  Will also restart Lisinopril for better BP control. 2.  Pulmonary - On 2 liters of oxygen via [place]. Will wean to room air as able. Check PA/LAT CXR in am. Encourage incentive spirometer. 3.  Acute blood loss anemia - H and H this am stable at 10.2 and 33.3 4. Supplement potassium 5. DM-CBGs 152/157/133. On Insulin. She was also on Metformin 1000 mg bid prior to surgery, which will likely restart at discharge as she had nausea previously and is just starting to tolerate po better.  Pre op HGA1C 8.5. She will need close medical follow up after discharge. 6. Remove EPW  Donielle M ZimmermanPA-C 11/23/2018,8:08 AM [phone]  Patient seen and examined, agree with above  Viviann SpareSteven C. Dorris FetchHendrickson, MD [facility] [phone]

## 2018-11-23 NOTE — Progress Notes (Signed)
Epicardial Pacer Wires removed per orders, wire ends intact. Vital signs taken per protocol, and bedrest for 1 hour. Sites clean and dry. Will continue to monitor.

## 2018-11-23 NOTE — Plan of Care (Signed)
  Problem: Health Behavior/Discharge Planning: Goal: Ability to manage health-related needs will improve Outcome: Progressing   Problem: Coping: Goal: Level of anxiety will decrease Outcome: Progressing   Problem: Nutrition: Goal: Adequate nutrition will be maintained Outcome: Progressing   Problem: Activity: Goal: Risk for activity intolerance will decrease Outcome: Progressing   Problem: Elimination: Goal: Will not experience complications related to bowel motility Outcome: Progressing Goal: Will not experience complications related to urinary retention Outcome: Progressing   Problem: Education: Goal: Will demonstrate proper wound care and an understanding of methods to prevent future damage Outcome: Progressing Goal: Knowledge of disease or condition will improve Outcome: Progressing Goal: Knowledge of the prescribed therapeutic regimen will improve Outcome: Progressing Goal: Individualized Educational Video(s) Outcome: Progressing   Problem: Safety: Goal: Ability to remain free from injury will improve Outcome: Progressing

## 2018-11-24 ENCOUNTER — Other Ambulatory Visit: Payer: Self-pay | Admitting: Physician Assistant

## 2018-11-24 ENCOUNTER — Inpatient Hospital Stay (HOSPITAL_COMMUNITY): Payer: Medicare Other

## 2018-11-24 LAB — GLUCOSE, CAPILLARY
Glucose-Capillary: 79 mg/dL (ref 70–99)
Glucose-Capillary: 155 mg/dL — ABNORMAL HIGH (ref 70–99)
Glucose-Capillary: 155 mg/dL — ABNORMAL HIGH (ref 70–99)

## 2018-11-24 LAB — ECHO TEE
AV Mean grad: 3 mmHg
Ao-asc: 2.9 cm
LVOT diameter: 18 mm
Mean grad: 1 mmHg
STJ: 2 cm
Sinus: 2.7 cm

## 2018-11-24 LAB — PROTIME-INR
INR: 2.2
Prothrombin Time: 24.2 seconds — ABNORMAL HIGH (ref 11.4–15.2)

## 2018-11-24 MED ORDER — AMIODARONE HCL 200 MG PO TABS: 200.0000 mg | ORAL_TABLET | Freq: Two times a day (BID) | ORAL | 1 refills | Status: DC

## 2018-11-24 MED ORDER — METOPROLOL TARTRATE 25 MG PO TABS: 25.0000 mg | ORAL_TABLET | Freq: Two times a day (BID) | ORAL | 1 refills | Status: DC

## 2018-11-24 MED ORDER — INSULIN LISPRO 100 UNIT/ML ~~LOC~~ SOLN: 6.0000 [IU] | Freq: Three times a day (TID) | SUBCUTANEOUS | 11 refills | Status: DC

## 2018-11-24 MED ORDER — ATORVASTATIN CALCIUM 20 MG PO TABS: 20.0000 mg | ORAL_TABLET | Freq: Every day | ORAL | 1 refills | Status: DC

## 2018-11-24 MED ORDER — BASAGLAR KWIKPEN 100 UNIT/ML ~~LOC~~ SOPN: 15.0000 [IU] | PEN_INJECTOR | Freq: Every day | SUBCUTANEOUS | 0 refills | Status: DC

## 2018-11-24 NOTE — Plan of Care (Signed)
  Problem: Health Behavior/Discharge Planning: Goal: Ability to manage health-related needs will improve Outcome: Progressing   Problem: Activity: Goal: Risk for activity intolerance will decrease Outcome: Progressing   Problem: Nutrition: Goal: Adequate nutrition will be maintained Outcome: Progressing   Problem: Coping: Goal: Level of anxiety will decrease Outcome: Progressing   Problem: Elimination: Goal: Will not experience complications related to bowel motility Outcome: Progressing Goal: Will not experience complications related to urinary retention Outcome: Progressing   Problem: Pain Managment: Goal: General experience of comfort will improve Outcome: Progressing   Problem: Safety: Goal: Ability to remain free from injury will improve Outcome: Progressing   Problem: Skin Integrity: Goal: Risk for impaired skin integrity will decrease Outcome: Progressing   Problem: Education: Goal: Will demonstrate proper wound care and an understanding of methods to prevent future damage Outcome: Progressing Goal: Knowledge of disease or condition will improve Outcome: Progressing Goal: Knowledge of the prescribed therapeutic regimen will improve Outcome: Progressing Goal: Individualized Educational Video(s) Outcome: Progressing

## 2018-11-24 NOTE — Care Management Important Message (Signed)
Important Message  Patient Details  Name: Maralyn SagoVictoria Leigh Kujawa MRN: [id number] Date of Birth: 1946-05-20   Medicare Important Message Given:  Yes    Oralia RudMegan P Aadvik Roker 11/24/2018, 12:56 PM

## 2018-11-24 NOTE — Progress Notes (Addendum)
      [address]       [place] [id number]             [phone]      7 Days Post-Op Procedure(s) (LRB): CORONARY ARTERY BYPASS GRAFTING (CABG), ON PUMP, TIMES FIVE, USING LEFT INTERNAL MAMMARY ARTERY AND ENDOSCOPICALLY HARVESTED LEFT GREATER SAPHENOUS VEIN (N/A) TRANSESOPHAGEAL ECHOCARDIOGRAM (TEE) (N/A) Subjective: No issues overnight. She feels well this morning and has been eating better with no nausea.   Objective: Vital signs in last 24 hours: Temp:  [97.8 F (36.6 C)-98.6 F (37 C)] 97.8 F (36.6 C) (12/09 0730) Pulse Rate:  [79-103] 90 (12/09 0730) Cardiac Rhythm: Normal sinus rhythm (12/09 0413) Resp:  [16-29] 16 (12/09 0730) BP: (124-153)/(54-90) 144/65 (12/09 0730) SpO2:  [89 %-94 %] 91 % (12/09 0730) Weight:  [21[69 kg] 69 kg (12/09 0413)     Intake/Output from previous day: 12/08 0701 - 12/09 0700 In: 960 [P.O.:960] Out: -  Intake/Output this shift: No intake/output data recorded.  General appearance: alert, cooperative and no distress Heart: regular rate and rhythm, S1, S2 normal, no murmur, click, rub or gallop Lungs: clear to auscultation bilaterally Abdomen: soft, non-tender; bowel sounds normal; no masses,  no organomegaly Extremities: extremities normal, atraumatic, no cyanosis or edema Wound: clean and dry  Lab Results: Recent Labs    11/22/18 0641 11/23/18 0402  WBC 8.2 8.3  HGB 10.1* 10.2*  HCT 32.0* 33.3*  PLT 241 309   BMET:  Recent Labs    11/22/18 0641 11/23/18 0402  NA 141 140  K 3.7 3.7  CL 97* 96*  CO2 33* 31  GLUCOSE 153* 159*  BUN 12 13  CREATININE 0.79 0.77  CALCIUM 9.0 9.3    PT/INR:  Recent Labs    11/24/18 0338  LABPROT 24.2*  INR 2.20   ABG    Component Value Date/Time   PHART 7.309 (L) 11/17/2018 2027   HCO3 24.2 11/17/2018 2027   TCO2 24 11/18/2018 1841   ACIDBASEDEF 2.0 11/17/2018 2027   O2SAT 98.0 11/17/2018 2027   CBG (last 3)  Recent Labs    11/24/18 0103 11/24/18 0141  11/24/18 0611  GLUCAP 79 155* 155*    Assessment/Plan: S/P Procedure(s) (LRB): CORONARY ARTERY BYPASS GRAFTING (CABG), ON PUMP, TIMES FIVE, USING LEFT INTERNAL MAMMARY ARTERY AND ENDOSCOPICALLY HARVESTED LEFT GREATER SAPHENOUS VEIN (N/A) TRANSESOPHAGEAL ECHOCARDIOGRAM (TEE) (N/A)  1. CV - Previous a fib. SR and hypertensive this am. On Amiodarone 200 mg bid,lisinopril 5 mg daily, Lopressor 25 mg bid, and Coumadin. INR this am 2.20.   2.  Pulmonary - On room air with good oxygen saturation. CXR stable this morning. Encourage incentive spirometer. 3.  Acute blood loss anemia - H and H stable at 10.2 and 33.3 4. Supplement potassium 5. DM-CBGs 79/155/155. On Insulin. Will restart her metformin on discharge. Pre op HGA1C 8.5. She will need close medical follow up after discharge.  Plan: Home today. Will decrease coumadin back to home dose of 5mg .    LOS: 7 days    Adriana [name] 11/24/2018   Chart reviewed, patient examined, agree with above. She feels well and sitting up eating breakfast. She is anxious to go home and I think that is fine. She may require less Coumadin on amio so INR will need to be checked later this week.

## 2018-11-24 NOTE — Care Management Note (Signed)
Case Management Note Donn PieriniKristi Avrey Flanagin RN, BSN Transitions of Care Unit 4E- RN Case Manager [phone]  Patient Details  Name: Maralyn SagoVictoria Leigh Caver MRN: [id number] Date of Birth: 09-08-1946  Subjective/Objective:     Pt admitted s/p CABGx3               Action/Plan: PTA pt lived at home, plan to transition back home, no CM needs noted for transition home. PCP-  Adrian PrinceStephen South  Expected Discharge Date:  11/24/18               Expected Discharge Plan:  Home/Self Care  In-House Referral:  NA  Discharge planning Services  CM Consult  Post Acute Care Choice:  NA Choice offered to:  NA  DME Arranged:    DME Agency:     HH Arranged:    HH Agency:     Status of Service:  Completed, signed off  If discussed at Long Length of Stay Meetings, dates discussed:    Discharge Disposition: home/self care   Additional Comments:  Darrold SpanWebster, Sueellen Kayes Hall, RN 11/24/2018, 11:39 AM

## 2018-11-24 NOTE — Progress Notes (Signed)
Patient is discharged per MD orders VSS. Telemetry and peripheral IV discontinued. All discharge education and information explained, patient demonstrated understanding via teach back. Patient will be transported home with family.

## 2018-11-24 NOTE — Progress Notes (Signed)
CARDIAC REHAB PHASE I   PRE:  Rate/Rhythm: 100 SR     SaO2: 91%RA   MODE:  Ambulation:470  ft   POST:  Rate/Rhythm: 113 ST  BP:  Supine:   Sitting: 139/65  Standing:    SaO2: 92%RA [id number] Waited for pt to finish breakfast and for husband to arrive to begin ed. Pt walked 470 ft on RA with steady gait and tolerated well. Began ed and husband arrived. Reviewed staying in the tube and gave handout. Discussed sternal precautions, IS, walking for ex, and gave diabetic and heart healthy diets. Referred to [place] CRP 2. Set up discharge video for viewing.    Adriana Nuttingharlene Naryiah Schley, RN BSN  11/24/2018 9:28 AM

## 2018-11-24 NOTE — Anesthesia Postprocedure Evaluation (Signed)
Anesthesia Post Note  Patient: Maralyn SagoVictoria Leigh Mcelhaney  Procedure(s) Performed: CORONARY ARTERY BYPASS GRAFTING (CABG), ON PUMP, TIMES FIVE, USING LEFT INTERNAL MAMMARY ARTERY AND ENDOSCOPICALLY HARVESTED LEFT GREATER SAPHENOUS VEIN (N/A Chest) TRANSESOPHAGEAL ECHOCARDIOGRAM (TEE) (N/A )     Patient location during evaluation: SICU Anesthesia Type: General Level of consciousness: sedated Pain management: pain level controlled Vital Signs Assessment: post-procedure vital signs reviewed and stable Respiratory status: patient remains intubated per anesthesia plan Cardiovascular status: stable Postop Assessment: no apparent nausea or vomiting Anesthetic complications: no    Last Vitals:  Vitals:   11/24/18 0413 11/24/18 0730  BP: (!) 124/59 (!) 144/65  Pulse: 80 90  Resp: 20 16  Temp: 36.6 C 36.6 C  SpO2: 90% 91%    Last Pain:  Vitals:   11/24/18 0743  TempSrc:   PainSc: 0-No pain                 Victorya Hillman

## 2018-11-28 ENCOUNTER — Other Ambulatory Visit: Payer: Self-pay

## 2018-11-28 ENCOUNTER — Ambulatory Visit (INDEPENDENT_AMBULATORY_CARE_PROVIDER_SITE_OTHER): Payer: Self-pay

## 2018-11-28 DIAGNOSIS — Z4802 Encounter for removal of sutures: Secondary | ICD-10-CM

## 2018-11-28 DIAGNOSIS — Z4801 Encounter for change or removal of surgical wound dressing: Secondary | ICD-10-CM

## 2018-11-28 NOTE — Progress Notes (Signed)
Patient arrived for nurse visit to remove sutures post- procedure CABG 11/17/2018 with Dr. Laneta SimmersBartle.  3 abdominal sutures removed with no signs/ symptoms of infection noted.  Incisions looked great and healing nicely. Patient tolerated procedure well.  Patient/ family instructed to keep the incision sites clean and dry.  Patient/ family acknowledged instructions given.  They are aware of their follow-up appointment.

## 2018-12-01 ENCOUNTER — Telehealth (HOSPITAL_COMMUNITY): Payer: Self-pay

## 2018-12-01 NOTE — Telephone Encounter (Signed)
Pt insurance is active and benefits verified through [employer]. Co-pay $0.00, DED $350.00/$350.00 met, out of pocket $5,000.00/$4,156.05 met, co-insurance 35%. No pre-authorization required. Passport, 12/01/18 2 12:06PM, REF# [id number]  Will contact patient to see if she is interested in the Cardiac Rehab Program. If interested, patient will need to complete follow up appt. Once completed, patient will be contacted for scheduling upon review by the RN Navigator.

## 2018-12-02 DIAGNOSIS — I739 Peripheral vascular disease, unspecified: Secondary | ICD-10-CM | POA: Insufficient documentation

## 2018-12-08 ENCOUNTER — Other Ambulatory Visit: Payer: Self-pay | Admitting: Surgery

## 2018-12-08 ENCOUNTER — Institutional Professional Consult (permissible substitution): Payer: Federal, State, Local not specified - PPO | Admitting: Neurology

## 2018-12-08 ENCOUNTER — Ambulatory Visit: Payer: Federal, State, Local not specified - PPO | Admitting: Cardiology

## 2018-12-08 ENCOUNTER — Encounter: Payer: Self-pay | Admitting: Cardiology

## 2018-12-08 VITALS — BP 102/54 | HR 62 | Ht 66.0 in | Wt 150.8 lb

## 2018-12-08 DIAGNOSIS — I4891 Unspecified atrial fibrillation: Secondary | ICD-10-CM

## 2018-12-08 DIAGNOSIS — Z951 Presence of aortocoronary bypass graft: Secondary | ICD-10-CM

## 2018-12-08 DIAGNOSIS — E1165 Type 2 diabetes mellitus with hyperglycemia: Secondary | ICD-10-CM | POA: Diagnosis not present

## 2018-12-08 DIAGNOSIS — E785 Hyperlipidemia, unspecified: Secondary | ICD-10-CM | POA: Diagnosis not present

## 2018-12-08 DIAGNOSIS — I9789 Other postprocedural complications and disorders of the circulatory system, not elsewhere classified: Secondary | ICD-10-CM

## 2018-12-08 DIAGNOSIS — I639 Cerebral infarction, unspecified: Secondary | ICD-10-CM

## 2018-12-08 DIAGNOSIS — D6851 Activated protein C resistance: Secondary | ICD-10-CM

## 2018-12-08 MED ORDER — AMIODARONE HCL 200 MG PO TABS: 200.0000 mg | ORAL_TABLET | Freq: Every day | ORAL | 3 refills | Status: DC

## 2018-12-08 NOTE — Patient Instructions (Addendum)
Medication Instructions:  DECREASE: Amiodarone to 200 mg once a day  If you need a refill on your cardiac medications before your next appointment, please call your pharmacy.   Lab work: None  If you have labs (blood work) drawn today and your tests are completely normal, you will receive your results only by: Marland Kitchen MyChart Message (if you have MyChart) OR . A paper copy in the mail If you have any lab test that is abnormal or we need to change your treatment, we will call you to review the results.  Testing/Procedures: None  Follow-Up: At [facility], you and your health needs are our priority.  As part of our continuing mission to provide you with exceptional heart care, we have created designated Provider Care Teams.  These Care Teams include your primary Cardiologist (physician) and Advanced Practice Providers (APPs -  Physician Assistants and Nurse Practitioners) who all work together to provide you with the care you need, when you need it. You will need a follow up appointment in:  3 months.  Please call our office 2 months in advance to schedule this appointment.  You may see [name], MD or one of the following Advanced Practice Providers on your designated Care Team: Tereso Newcomer, PA-C [place], [place] . Berton Bon, NP  Any Other Special Instructions Will Be Listed Below (If Applicable).   DASH Eating Plan DASH stands for "Dietary Approaches to Stop Hypertension." The DASH eating plan is a healthy eating plan that has been shown to reduce high blood pressure (hypertension). It may also reduce your risk for type 2 diabetes, heart disease, and stroke. The DASH eating plan may also help with weight loss. What are tips for following this plan?  General guidelines  Avoid eating more than 2,300 mg (milligrams) of salt (sodium) a day. If you have hypertension, you may need to reduce your sodium intake to 1,500 mg a day.  Limit alcohol intake to no more than 1 drink a day  for nonpregnant women and 2 drinks a day for men. One drink equals 12 oz of beer, 5 oz of wine, or 1 oz of hard liquor.  Work with your health care provider to maintain a healthy body weight or to lose weight. Ask what an ideal weight is for you.  Get at least 30 minutes of exercise that causes your heart to beat faster (aerobic exercise) most days of the week. Activities may include walking, swimming, or biking.  Work with your health care provider or diet and nutrition specialist (dietitian) to adjust your eating plan to your individual calorie needs. Reading food labels   Check food labels for the amount of sodium per serving. Choose foods with less than 5 percent of the Daily Value of sodium. Generally, foods with less than 300 mg of sodium per serving fit into this eating plan.  To find whole grains, look for the word "whole" as the first word in the ingredient list. Shopping  Buy products labeled as "low-sodium" or "no salt added."  Buy fresh foods. Avoid canned foods and premade or frozen meals. Cooking  Avoid adding salt when cooking. Use salt-free seasonings or herbs instead of table salt or sea salt. Check with your health care provider or pharmacist before using salt substitutes.  Do not fry foods. Cook foods using healthy methods such as baking, boiling, grilling, and broiling instead.  Cook with heart-healthy oils, such as olive, canola, soybean, or sunflower oil. Meal planning  Eat a balanced  diet that includes: ? 5 or more servings of fruits and vegetables each day. At each meal, try to fill half of your plate with fruits and vegetables. ? Up to 6-8 servings of whole grains each day. ? Less than 6 oz of lean meat, poultry, or fish each day. A 3-oz serving of meat is about the same size as a deck of cards. One egg equals 1 oz. ? 2 servings of low-fat dairy each day. ? A serving of nuts, seeds, or beans 5 times each week. ? Heart-healthy fats. Healthy fats called  Omega-3 fatty acids are found in foods such as flaxseeds and coldwater fish, like sardines, salmon, and mackerel.  Limit how much you eat of the following: ? Canned or prepackaged foods. ? Food that is high in trans fat, such as fried foods. ? Food that is high in saturated fat, such as fatty meat. ? Sweets, desserts, sugary drinks, and other foods with added sugar. ? Full-fat dairy products.  Do not salt foods before eating.  Try to eat at least 2 vegetarian meals each week.  Eat more home-cooked food and less restaurant, buffet, and fast food.  When eating at a restaurant, ask that your food be prepared with less salt or no salt, if possible. What foods are recommended? The items listed may not be a complete list. Talk with your dietitian about what dietary choices are best for you. Grains Whole-grain or whole-wheat bread. Whole-grain or whole-wheat pasta. Brown rice. Orpah Cobbatmeal. Quinoa. Bulgur. Whole-grain and low-sodium cereals. Pita bread. Low-fat, low-sodium crackers. Whole-wheat flour tortillas. Vegetables Fresh or frozen vegetables (raw, steamed, roasted, or grilled). Low-sodium or reduced-sodium tomato and vegetable juice. Low-sodium or reduced-sodium tomato sauce and tomato paste. Low-sodium or reduced-sodium canned vegetables. Fruits All fresh, dried, or frozen fruit. Canned fruit in natural juice (without added sugar). Meat and other protein foods Skinless chicken or Malawiturkey. Ground chicken or [place]urkey. Pork with fat trimmed off. Fish and seafood. Egg whites. Dried beans, peas, or lentils. Unsalted nuts, nut butters, and seeds. Unsalted canned beans. Lean cuts of beef with fat trimmed off. Low-sodium, lean deli meat. Dairy Low-fat (1%) or fat-free (skim) milk. Fat-free, low-fat, or reduced-fat cheeses. Nonfat, low-sodium ricotta or cottage cheese. Low-fat or nonfat yogurt. Low-fat, low-sodium cheese. Fats and oils Soft margarine without trans fats. Vegetable oil. Low-fat,  reduced-fat, or light mayonnaise and salad dressings (reduced-sodium). Canola, safflower, olive, soybean, and sunflower oils. Avocado. Seasoning and other foods Herbs. Spices. Seasoning mixes without salt. Unsalted popcorn and pretzels. Fat-free sweets. What foods are not recommended? The items listed may not be a complete list. Talk with your dietitian about what dietary choices are best for you. Grains Baked goods made with fat, such as croissants, muffins, or some breads. Dry pasta or rice meal packs. Vegetables Creamed or fried vegetables. Vegetables in a cheese sauce. Regular canned vegetables (not low-sodium or reduced-sodium). Regular canned tomato sauce and paste (not low-sodium or reduced-sodium). Regular tomato and vegetable juice (not low-sodium or reduced-sodium). Rosita FirePickles. Olives. Fruits Canned fruit in a light or heavy syrup. Fried fruit. Fruit in cream or butter sauce. Meat and other protein foods Fatty cuts of meat. Ribs. Fried meat. Tomasa BlaseBacon. Sausage. Bologna and other processed lunch meats. Salami. Fatback. Hotdogs. Bratwurst. Salted nuts and seeds. Canned beans with added salt. Canned or smoked fish. Whole eggs or egg yolks. Chicken or Malaw[place]urkey with skin. Dairy Whole or 2% milk, cream, and half-and-half. Whole or full-fat cream cheese. Whole-fat or sweetened yogurt. Full-fat cheese. Nondairy  creamers. Whipped toppings. Processed cheese and cheese spreads. Fats and oils Butter. Stick margarine. Lard. Shortening. Ghee. Bacon fat. Tropical oils, such as coconut, palm kernel, or palm oil. Seasoning and other foods Salted popcorn and pretzels. Onion salt, garlic salt, seasoned salt, table salt, and sea salt. Worcestershire sauce. Tartar sauce. Barbecue sauce. Teriyaki sauce. Soy sauce, including reduced-sodium. Steak sauce. Canned and packaged gravies. Fish sauce. Oyster sauce. Cocktail sauce. Horseradish that you find on the shelf. Ketchup. Mustard. Meat flavorings and tenderizers.  Bouillon cubes. Hot sauce and Tabasco sauce. Premade or packaged marinades. Premade or packaged taco seasonings. Relishes. Regular salad dressings. Where to find more information:  National Heart, Lung, and Blood Institute: PopSteam.iswww.nhlbi.nih.gov  American Heart Association: www.heart.org Summary  The DASH eating plan is a healthy eating plan that has been shown to reduce high blood pressure (hypertension). It may also reduce your risk for type 2 diabetes, heart disease, and stroke.  With the DASH eating plan, you should limit salt (sodium) intake to 2,300 mg a day. If you have hypertension, you may need to reduce your sodium intake to 1,500 mg a day.  When on the DASH eating plan, aim to eat more fresh fruits and vegetables, whole grains, lean proteins, low-fat dairy, and heart-healthy fats.  Work with your health care provider or diet and nutrition specialist (dietitian) to adjust your eating plan to your individual calorie needs. This information is not intended to replace advice given to you by your health care provider. Make sure you discuss any questions you have with your health care provider. Document Released: 11/22/2011 Document Revised: 11/26/2016 Document Reviewed: 11/26/2016 Elsevier Interactive Patient Education  2019 [employer]

## 2018-12-08 NOTE — Progress Notes (Signed)
Cardiology Office Note:    Date:  12/08/2018   ID:  Adriana [name], Adriana [name] Aug 16, 1946, MRN [id number]  PCP:  [name], MD  Cardiologist:  [name], MD  Referring MD: [name], MD   Chief Complaint  Patient presents with  . Hospitalization Follow-up    CABG    History of Present Illness:    Adriana [name] is a 72 y.o. female with a past medical history significant for poorly controlled diabetes type 2, hypertension, hyperlipidemia, factor V Leiden mutation with DVT, cerebrovascular disease and stroke status post bilateral carotid endarterectomy, CAD CABG X 5 11/17/18.  Adriana [name] presented to the office on 10/13/2018 and had complaints of worsening dyspnea on exertion.  A nuclear stress test was ordered and found to be high risk.  An echocardiogram done on the same day showed low normal EF 50-55% with wall motion abnormalities consistent with areas of ischemia noted on the stress test.  A cardiac catheterization was arranged and showed multivessel disease.  She underwent CABG x5 11/17/2018. LIMA-LAD, SVG-Diag, sequential SVG to OM1, OM2, SVG to RCA.   Adriana [name] is here today for hospital follow up with her husband. She is doing well and looks very well. She has been walking around in the house. She is not yet walking outside due to the weather.  She has low energy. She is still sore, especially between her shoulder blades.   No fluttering, palpitations, lightheadedness, near syncope or swelling. She is sleeping on a wedge pillow, mostly for discomfort. She still has trouble getting a full deep breath. She is not still using IS but does deep breathing exercises.   Past Medical History:  Diagnosis Date  . Abdominal pain 05/01/2018  . AKI (acute kidney injury) (HCC) 05/02/2018  . Cellulitis of right leg 05/01/2018  . Coronary artery disease   . Depression    denies  . DVT (deep venous thrombosis) (HCC)   . Dyspnea    "anytime"  . Essential hypertension    . Factor V Leiden mutation (HCC)   . GERD (gastroesophageal reflux disease)   . Heart burn   . History of kidney stones   . HLD (hyperlipidemia)   . Myocardial infarction (HCC)   . Pneumonia    x2  . PONV (postoperative nausea and vomiting)    blood pressure up/down, confusion  . Poorly controlled type 2 diabetes mellitus (HCC)   . 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 Sepsis (HCC) 05/01/2018  . Sleep apnea   . Stroke (cerebrum) [facility]) 2004   right side weakness-resolved    Past Surgical History:  Procedure Laterality Date  . ABDOMINAL HYSTERECTOMY    . BACK SURGERY    . Carotid artery endarterectomy     right and left side with stents  . CATARACT EXTRACTION Bilateral   . CHOLECYSTECTOMY    . CORONARY ARTERY BYPASS GRAFT N/A 11/17/2018   Procedure: CORONARY ARTERY BYPASS GRAFTING (CABG), ON PUMP, TIMES FIVE, USING LEFT INTERNAL MAMMARY ARTERY AND ENDOSCOPICALLY HARVESTED LEFT GREATER SAPHENOUS VEIN;  Surgeon: Alleen Borne, MD;  Location: [facility] OR;  Service: Open Heart Surgery;  Laterality: N/A;  . LEFT HEART CATH AND CORONARY ANGIOGRAPHY N/A 11/04/2018   Procedure: LEFT HEART CATH AND CORONARY ANGIOGRAPHY;  Surgeon: [name], MD;  Location: [facility] INVASIVE CV LAB;  Service: Cardiovascular;  Laterality: N/A;  . LITHOTRIPSY     x3  . TEE WITHOUT CARDIOVERSION N/A 11/17/2018   Procedure:  TRANSESOPHAGEAL ECHOCARDIOGRAM (TEE);  Surgeon: Alleen BorneBartle, Bryan K, MD;  [facility];  Service: Open Heart Surgery;  Laterality: N/A;  . TONSILLECTOMY    . trigger fingers left and right Bilateral   . TUBAL LIGATION      Current Medications: Current Meds  Medication Sig  . acetaminophen (TYLENOL) 500 MG tablet Take 1 tablet (500 mg total) by mouth every 6 (six) hours as needed for moderate pain (or Fever >/= 101).  Marland Kitchen. amiodarone (PACERONE) 200 MG tablet Take 1 tablet (200 mg total) by mouth daily.  Marland Kitchen. atorvastatin (LIPITOR) 20 MG tablet Take  1 tablet (20 mg total) by mouth daily at 6 PM.  . buPROPion (WELLBUTRIN XL) 300 MG 24 hr tablet Take 300 mg by mouth daily.   . calcium carbonate (ANTACID) 500 MG chewable tablet Chew 2 tablets by mouth daily as needed for indigestion or heartburn.  . Cholecalciferol (VITAMIN D) 2000 units CAPS Take 2,000 Units by mouth daily.   . clobetasol cream (TEMOVATE) 0.05 % Apply 1 application topically daily as needed (for psoriasis).   . Continuous Blood Gluc Sensor (FREESTYLE LIBRE 14 DAY SENSOR) MISC 1 each by Does not apply route every 14 (fourteen) days. Change every 2 weeks  . Insulin Glargine (BASAGLAR KWIKPEN) 100 UNIT/ML SOPN Inject 0.15 mLs (15 Units total) into the skin at bedtime.  . insulin lispro (HUMALOG) 100 UNIT/ML injection Inject 0.06 mLs (6 Units total) into the skin 3 (three) times daily before meals.  Marland Kitchen. lisinopril (PRINIVIL,ZESTRIL) 5 MG tablet Take 5 mg by mouth daily.  . metFORMIN (GLUCOPHAGE) 1000 MG tablet Take 1,000 mg by mouth 2 (two) times daily with a meal.  . metoprolol tartrate (LOPRESSOR) 25 MG tablet Take 1 tablet (25 mg total) by mouth 2 (two) times daily.  . potassium citrate (UROCIT-K) 10 MEQ (1080 MG) SR tablet Take 10 mEq by mouth 2 (two) times daily.   . promethazine (PHENERGAN) 25 MG tablet Take 12.5 mg by mouth every evening.   . warfarin (COUMADIN) 5 MG tablet Take 5 mg by mouth every evening.   . white petrolatum (VASELINE) GEL Apply 1 application topically daily as needed (for psoriasis).  . [DISCONTINUED] amiodarone (PACERONE) 200 MG tablet Take 1 tablet (200 mg total) by mouth 2 (two) times daily.  . [DISCONTINUED] amiodarone (PACERONE) 200 MG tablet Take 1 tablet (200 mg total) by mouth daily.     Allergies:   Anesthesia s-i-60 and Aspirin   Social History   Socioeconomic History  . Marital status: Married    Spouse name: Not on file  . Number of children: Not on file  . Years of education: Not on file  . Highest education level: Not on file    Occupational History  . Not on file  Social Needs  . Financial resource strain: Not on file  . Food insecurity:    Worry: Not on file    Inability: Not on file  . Transportation needs:    Medical: Not on file    Non-medical: Not on file  Tobacco Use  . Smoking status: Former Smoker    Last attempt to quit: 2004    Years since quitting: 15.9  . Smokeless tobacco: Never Used  Substance and Sexual Activity  . Alcohol use: Not Currently  . Drug use: Not Currently  . Sexual activity: Not on file  Lifestyle  . Physical activity:    Days per week: Not on file    Minutes per session: Not on  file  . Stress: Not on file  Relationships  . Social connections:    Talks on phone: Not on file    Gets together: Not on file    Attends religious service: Not on file    Active member of club or organization: Not on file    Attends meetings of clubs or organizations: Not on file    Relationship status: Not on file  Other Topics Concern  . Not on file  Social History Narrative  . Not on file     Family History: The patient's family history includes COPD in her brother; Cataracts in her maternal grandmother; Diabetes in her brother and maternal grandmother; Lung cancer in her father; Stroke in her mother. There is no history of Amblyopia, Blindness, Glaucoma, Hypertension, Macular degeneration, Retinal detachment, Strabismus, or Retinitis pigmentosa. ROS:   Please see the history of present illness.     All other systems reviewed and are negative.  EKGs/Labs/Other Studies Reviewed:    The following studies were reviewed today:  Left heart cath 11/04/2018 1. 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 Chronic subtotal occlusion of the LAD, filling antegrade but also supplied by right-left collaterals 3. Severe diffuse proximal and mid-LAD and first diagonal stenoses 4. Severe diffuse OM stenosis 5. Mild segmental LV systolic  dysfunction  Recommend outpatient TCTS consultation for consideration of CABG in this patient with diabetes, LV dysfunction, and multivessel CAD   Nuclear stress test 10/29/2018 Study Highlights    Nuclear stress EF: 40%.  There was no ST segment deviation noted during stress.  No T wave inversion was noted during stress.  Blood pressure demonstrated a normal response to exercise.  Defect 1: There is a medium defect of moderate severity present in the basal inferoseptal, mid inferoseptal, apical septal and apical inferior location.  Defect 2: There is a medium defect of severe severity present in the mid anterior, mid anteroseptal, apical anterior, apical septal and apex location.  Findings consistent with prior myocardial infarction with peri-infarct ischemia in the anteroseptal, inferoseptal and apical regions.  This is a high risk study.  The left ventricular ejection fraction is moderately decreased (30-44%).     Echocardiogram 10/29/2018 Study Conclusions  - Left ventricle: The cavity size was normal. There was moderate   concentric hypertrophy. Systolic function was normal. The   estimated ejection fraction was in the range of 50% to 55%.   Doppler parameters are consistent with abnormal left ventricular   relaxation (grade 1 diastolic dysfunction). Acoustic contrast   opacification revealed no evidence ofthrombus. - Regional wall motion abnormality: Hypokinesis of the mid   anteroseptal, mid inferoseptal, mid-apical inferior, apical   septal, and apical myocardium. - Mitral valve: There was trivial regurgitation. - Right ventricle: Systolic function was normal. - Right atrium: There was a possible, small, fixed mass on the   right side of the interatrial septum. This is best seen on image   51. Consider myxoma. - Atrial septum: The septum was thickened. A patent foramen ovale   cannot be excluded. There was a possiblemasson the right side of   the interatrial  septum. - Tricuspid valve: There was trivial regurgitation.  Impressions:  - 1. Low normal EF, but wall motion abnormalities noted.   Hypokinesis of mid to distal anteroseptum, inferoseptum, and   inferior walls as well as the apex. Echo contrast (definity) used   to better define endocardial borders and exclude LV thrombus)   2.  Apical views suggest the presence of a small, fixed mass   adjacent to the intra-atrial septum. Consider mxyoma.   EKG:  EKG is not ordered today.    Recent Labs: 11/12/2018: ALT 24 11/18/2018: Magnesium 2.4 11/23/2018: BUN 13; Creatinine, Ser 0.77; Hemoglobin 10.2; Platelets 309; Potassium 3.7; Sodium 140   Recent Lipid Panel    Component Value Date/Time   CHOL 83 05/05/2018 0316   TRIG 93 05/05/2018 0316   HDL 31 (L) 05/05/2018 0316   CHOLHDL 2.7 05/05/2018 0316   VLDL 19 05/05/2018 0316   LDLCALC 33 05/05/2018 0316    Physical Exam:    VS:  BP (!) 102/54   Pulse 62   Ht 5\' 6"  (1.676 m)   Wt 150 lb 12.8 oz (68.4 kg)   SpO2 94%   BMI 24.34 kg/m     Wt Readings from Last 3 Encounters:  12/08/18 150 lb 12.8 oz (68.4 kg)  11/24/18 152 lb 3.2 oz (69 kg)  11/12/18 153 lb 14.1 oz (69.8 kg)     Physical Exam  Constitutional: She is oriented to person, place, and time. She appears well-developed and well-nourished. No distress.  HENT:  Head: Normocephalic and atraumatic.  Neck: Normal range of motion. Neck supple. No JVD present.  Cardiovascular: Normal rate, regular rhythm, normal heart sounds and intact distal pulses. Exam reveals no gallop and no friction rub.  No murmur heard. Pulmonary/Chest: Effort normal and breath sounds normal. No respiratory distress. She has no wheezes. She has no rales.  Abdominal: Soft. Bowel sounds are normal. She exhibits no distension.  Musculoskeletal: Normal range of motion.        General: No deformity or edema.  Neurological: She is alert and oriented to person, place, and time.  Skin: Skin is warm and  dry.  Sternal incision and chest tube sites healing well with no redness, swelling or drainage.   Psychiatric: She has a normal mood and affect. Her behavior is normal. Judgment and thought content normal.  Vitals reviewed.   ASSESSMENT:    1. S/P CABG x 5   2. Atrial fibrillation, transient (HCC)   3. Poorly controlled type 2 diabetes mellitus (HCC)   4. Dyslipidemia, goal LDL below 70   5. Factor V Leiden mutation (HCC)   6. Cerebrovascular accident (CVA), unspecified mechanism (HCC)    PLAN:    In order of problems listed above:  CAD -S/P CABG X 5 11/17/2018 -Medical therapy includes beta-blocker, statin, not on aspirin due to need for anticoagulation (sensitivity to aspirin) -She has some fatigue that may be exacerbated by beta blocker. She will continue the BB and try to increase activity, should be starting CR in a couple of weeks. Re-evaluate at next visit.  -Advised to slowly increase activity level.   Post op afib -No apparent recurrences. Heart sounds regular on exam.  -Reduce amiodarone to once daily and may be able to discontinue at 6 week follow up with surgeon.  -She is on anticoagulation for Factor 5 Leiden and hx DVT and stroke.   poorly controlled diabetes type 2 -A1c 8.5. Advise target of <7. Home blood sugars ~125-130 fasting.  -Management per PCP.  -On lisinopril for renal protection  Dyslipidemia -Atorvastatin 20 mg daily, LDL 33 in 04/2018. Continue current therapy.   factor V Leiden mutation with DVT -Anticoagulation for life. Warfarin followed by PCP.  -Pt advised that changing the amiodarone therapy can affect INR so should have monitored closely.   Cerebrovascular disease and  stroke status post bilateral carotid endarterectomy, 2005.  -On statin, warfarin. Not on aspirin due to sensitivity and being anticoagulated.    Medication Adjustments/Labs and Tests Ordered: Current medicines are reviewed at length with the patient today.  Concerns  regarding medicines are outlined above. Labs and tests ordered and medication changes are outlined in the patient instructions below:  Patient Instructions  Medication Instructions:  DECREASE: Amiodarone to 200 mg once a day  If you need a refill on your cardiac medications before your next appointment, please call your pharmacy.   Lab work: None  If you have labs (blood work) drawn today and your tests are completely normal, you will receive your results only by: Marland Kitchen MyChart Message (if you have MyChart) OR . A paper copy in the mail If you have any lab test that is abnormal or we need to change your treatment, we will call you to review the results.  Testing/Procedures: None  Follow-Up: At [facility], you and your health needs are our priority.  As part of our continuing mission to provide you with exceptional heart care, we have created designated Provider Care Teams.  These Care Teams include your primary Cardiologist (physician) and Advanced Practice Providers (APPs -  Physician Assistants and Nurse Practitioners) who all work together to provide you with the care you need, when you need it. You will need a follow up appointment in:  3 months.  Please call our office 2 months in advance to schedule this appointment.  You may see [name], MD or one of the following Advanced Practice Providers on your designated Care Team: Tereso Newcomer, PA-C [place], [place] . Berton Bon, NP  Any Other Special Instructions Will Be Listed Below (If Applicable).   DASH Eating Plan DASH stands for "Dietary Approaches to Stop Hypertension." The DASH eating plan is a healthy eating plan that has been shown to reduce high blood pressure (hypertension). It may also reduce your risk for type 2 diabetes, heart disease, and stroke. The DASH eating plan may also help with weight loss. What are tips for following this plan?  General guidelines  Avoid eating more than 2,300 mg (milligrams) of  salt (sodium) a day. If you have hypertension, you may need to reduce your sodium intake to 1,500 mg a day.  Limit alcohol intake to no more than 1 drink a day for nonpregnant women and 2 drinks a day for men. One drink equals 12 oz of beer, 5 oz of wine, or 1 oz of hard liquor.  Work with your health care provider to maintain a healthy body weight or to lose weight. Ask what an ideal weight is for you.  Get at least 30 minutes of exercise that causes your heart to beat faster (aerobic exercise) most days of the week. Activities may include walking, swimming, or biking.  Work with your health care provider or diet and nutrition specialist (dietitian) to adjust your eating plan to your individual calorie needs. Reading food labels   Check food labels for the amount of sodium per serving. Choose foods with less than 5 percent of the Daily Value of sodium. Generally, foods with less than 300 mg of sodium per serving fit into this eating plan.  To find whole grains, look for the word "whole" as the first word in the ingredient list. Shopping  Buy products labeled as "low-sodium" or "no salt added."  Buy fresh foods. Avoid canned foods and premade or frozen meals. Cooking  Avoid adding  salt when cooking. Use salt-free seasonings or herbs instead of table salt or sea salt. Check with your health care provider or pharmacist before using salt substitutes.  Do not fry foods. Cook foods using healthy methods such as baking, boiling, grilling, and broiling instead.  Cook with heart-healthy oils, such as olive, canola, soybean, or sunflower oil. Meal planning  Eat a balanced diet that includes: ? 5 or more servings of fruits and vegetables each day. At each meal, try to fill half of your plate with fruits and vegetables. ? Up to 6-8 servings of whole grains each day. ? Less than 6 oz of lean meat, poultry, or fish each day. A 3-oz serving of meat is about the same size as a deck of cards. One  egg equals 1 oz. ? 2 servings of low-fat dairy each day. ? A serving of nuts, seeds, or beans 5 times each week. ? Heart-healthy fats. Healthy fats called Omega-3 fatty acids are found in foods such as flaxseeds and coldwater fish, like sardines, salmon, and mackerel.  Limit how much you eat of the following: ? Canned or prepackaged foods. ? Food that is high in trans fat, such as fried foods. ? Food that is high in saturated fat, such as fatty meat. ? Sweets, desserts, sugary drinks, and other foods with added sugar. ? Full-fat dairy products.  Do not salt foods before eating.  Try to eat at least 2 vegetarian meals each week.  Eat more home-cooked food and less restaurant, buffet, and fast food.  When eating at a restaurant, ask that your food be prepared with less salt or no salt, if possible. What foods are recommended? The items listed may not be a complete list. Talk with your dietitian about what dietary choices are best for you. Grains Whole-grain or whole-wheat bread. Whole-grain or whole-wheat pasta. Brown rice. Orpah Cobb. Bulgur. Whole-grain and low-sodium cereals. Pita bread. Low-fat, low-sodium crackers. Whole-wheat flour tortillas. Vegetables Fresh or frozen vegetables (raw, steamed, roasted, or grilled). Low-sodium or reduced-sodium tomato and vegetable juice. Low-sodium or reduced-sodium tomato sauce and tomato paste. Low-sodium or reduced-sodium canned vegetables. Fruits All fresh, dried, or frozen fruit. Canned fruit in natural juice (without added sugar). Meat and other protein foods Skinless chicken or [place]. Ground chicken or [place]. Pork with fat trimmed off. Fish and seafood. Egg whites. Dried beans, peas, or lentils. Unsalted nuts, nut butters, and seeds. Unsalted canned beans. Lean cuts of beef with fat trimmed off. Low-sodium, lean deli meat. Dairy Low-fat (1%) or fat-free (skim) milk. Fat-free, low-fat, or reduced-fat cheeses. Nonfat, low-sodium ricotta  or cottage cheese. Low-fat or nonfat yogurt. Low-fat, low-sodium cheese. Fats and oils Soft margarine without trans fats. Vegetable oil. Low-fat, reduced-fat, or light mayonnaise and salad dressings (reduced-sodium). Canola, safflower, olive, soybean, and sunflower oils. Avocado. Seasoning and other foods Herbs. Spices. Seasoning mixes without salt. Unsalted popcorn and pretzels. Fat-free sweets. What foods are not recommended? The items listed may not be a complete list. Talk with your dietitian about what dietary choices are best for you. Grains Baked goods made with fat, such as croissants, muffins, or some breads. Dry pasta or rice meal packs. Vegetables Creamed or fried vegetables. Vegetables in a cheese sauce. Regular canned vegetables (not low-sodium or reduced-sodium). Regular canned tomato sauce and paste (not low-sodium or reduced-sodium). Regular tomato and vegetable juice (not low-sodium or reduced-sodium). Rosita Fire. Olives. Fruits Canned fruit in a light or heavy syrup. Fried fruit. Fruit in cream or butter sauce. Meat and other  protein foods Fatty cuts of meat. Ribs. Fried meat. Tomasa Blase. Sausage. Bologna and other processed lunch meats. Salami. Fatback. Hotdogs. Bratwurst. Salted nuts and seeds. Canned beans with added salt. Canned or smoked fish. Whole eggs or egg yolks. Chicken or [place] with skin. Dairy Whole or 2% milk, cream, and half-and-half. Whole or full-fat cream cheese. Whole-fat or sweetened yogurt. Full-fat cheese. Nondairy creamers. Whipped toppings. Processed cheese and cheese spreads. Fats and oils Butter. Stick margarine. Lard. Shortening. Ghee. Bacon fat. Tropical oils, such as coconut, palm kernel, or palm oil. Seasoning and other foods Salted popcorn and pretzels. Onion salt, garlic salt, seasoned salt, table salt, and sea salt. Worcestershire sauce. Tartar sauce. Barbecue sauce. Teriyaki sauce. Soy sauce, including reduced-sodium. Steak sauce. Canned and packaged  gravies. Fish sauce. Oyster sauce. Cocktail sauce. Horseradish that you find on the shelf. Ketchup. Mustard. Meat flavorings and tenderizers. Bouillon cubes. Hot sauce and Tabasco sauce. Premade or packaged marinades. Premade or packaged taco seasonings. Relishes. Regular salad dressings. Where to find more information:  National Heart, Lung, and Blood Institute: PopSteam.is  American Heart Association: www.heart.org Summary  The DASH eating plan is a healthy eating plan that has been shown to reduce high blood pressure (hypertension). It may also reduce your risk for type 2 diabetes, heart disease, and stroke.  With the DASH eating plan, you should limit salt (sodium) intake to 2,300 mg a day. If you have hypertension, you may need to reduce your sodium intake to 1,500 mg a day.  When on the DASH eating plan, aim to eat more fresh fruits and vegetables, whole grains, lean proteins, low-fat dairy, and heart-healthy fats.  Work with your health care provider or diet and nutrition specialist (dietitian) to adjust your eating plan to your individual calorie needs. This information is not intended to replace advice given to you by your health care provider. Make sure you discuss any questions you have with your health care provider. Document Released: 11/22/2011 Document Revised: 11/26/2016 Document Reviewed: 11/26/2016 Elsevier Interactive Patient Education  [address].      Signed, Berton Bon, NP  12/08/2018 12:08 PM    [facility]

## 2018-12-15 ENCOUNTER — Ambulatory Visit (INDEPENDENT_AMBULATORY_CARE_PROVIDER_SITE_OTHER): Payer: Self-pay | Admitting: Physician Assistant

## 2018-12-15 ENCOUNTER — Ambulatory Visit
Admission: RE | Admit: 2018-12-15 | Discharge: 2018-12-15 | Disposition: A | Discharge status: Home / Self Care | Payer: Federal, State, Local not specified - PPO | Source: Ambulatory Visit | Attending: Surgery | Admitting: Surgery

## 2018-12-15 VITALS — BP 108/63 | HR 73 | Resp 20 | Ht 66.0 in | Wt 147.0 lb

## 2018-12-15 DIAGNOSIS — Z951 Presence of aortocoronary bypass graft: Secondary | ICD-10-CM

## 2018-12-15 DIAGNOSIS — I2510 Atherosclerotic heart disease of native coronary artery without angina pectoris: Secondary | ICD-10-CM

## 2018-12-15 NOTE — Patient Instructions (Signed)
You are encouraged to enroll and participate in the outpatient cardiac rehab program beginning as soon as practical.  You may continue to gradually increase your physical activity as tolerated.  Refrain from any heavy lifting or strenuous use of your arms and shoulders until at least 8 weeks from the time of your surgery, and avoid activities that cause increased pain in your chest on the side of your surgical incision.  Otherwise you may continue to increase activities without any particular limitations.  Increase the intensity and duration of physical activity gradually.  Make every effort to keep your diabetes under very tight control.  Follow up closely with your primary care physician or endocrinologist and strive to keep their hemoglobin A1c levels as low as possible, preferably near or below 6.0.  The long term benefits of strict control of diabetes are far reaching and critically important for your overall health and survival.  You may return to driving an automobile as long as you are no longer requiring oral narcotic pain relievers during the daytime.  It would be wise to start driving only short distances during the daylight and gradually increase from there as you feel comfortable.

## 2018-12-15 NOTE — Progress Notes (Signed)
HPI:  Patient returns for routine postoperative follow-up having undergone a CABG x 5 on 11/17/2018 by Dr. Laneta SimmersBartle. Patient reports she is fatigued and a little depressed. She also has had diarrhea for the last couple of days. She denies shortness of breath, fever, or chest pain.   Current Outpatient Medications  Medication Sig Dispense Refill  . acetaminophen (TYLENOL) 500 MG tablet Take 1 tablet (500 mg total) by mouth every 6 (six) hours as needed for moderate pain (or Fever >/= 101). 20 tablet 0  . amiodarone (PACERONE) 200 MG tablet Take 1 tablet (200 mg total) by mouth daily. 90 tablet 3  . atorvastatin (LIPITOR) 20 MG tablet Take 1 tablet (20 mg total) by mouth daily at 6 PM. 30 tablet 1  . buPROPion (WELLBUTRIN XL) 300 MG 24 hr tablet Take 300 mg by mouth daily.   2  . calcium carbonate (ANTACID) 500 MG chewable tablet Chew 2 tablets by mouth daily as needed for indigestion or heartburn.    . Cholecalciferol (VITAMIN D) 2000 units CAPS Take 2,000 Units by mouth daily.     . clobetasol cream (TEMOVATE) 0.05 % Apply 1 application topically daily as needed (for psoriasis).     . Continuous Blood Gluc Sensor (FREESTYLE LIBRE 14 DAY SENSOR) MISC 1 each by Does not apply route every 14 (fourteen) days. Change every 2 weeks 2 each 11  . Insulin Glargine (BASAGLAR KWIKPEN) 100 UNIT/ML SOPN Inject 0.15 mLs (15 Units total) into the skin at bedtime. 4.5 mL 0  . insulin lispro (HUMALOG) 100 UNIT/ML injection Inject 0.06 mLs (6 Units total) into the skin 3 (three) times daily before meals. 10 mL 11  . lisinopril (PRINIVIL,ZESTRIL) 5 MG tablet Take 5 mg by mouth daily.    . metFORMIN (GLUCOPHAGE) 1000 MG tablet Take 1,000 mg by mouth 2 (two) times daily with a meal.    . metoprolol tartrate (LOPRESSOR) 25 MG tablet Take 1 tablet (25 mg total) by mouth 2 (two) times daily. 60 tablet 1  . potassium citrate (UROCIT-K) 10 MEQ (1080 MG) SR tablet Take 10 mEq by mouth 2 (two) times daily.     .  promethazine (PHENERGAN) 25 MG tablet Take 12.5 mg by mouth every evening.   1  . warfarin (COUMADIN) 5 MG tablet Take 5 mg by mouth every evening.     . white petrolatum (VASELINE) GEL Apply 1 application topically daily as needed (for psoriasis).    Vital Signs: BP 108/63, HR 73, RR 20, Oxygenation 95% on room air   Physical Exam: CV-RRR Pulmonary-Mostly clear bilaterally Abdomen-Soft, non tender, bowel sounds present Extremities-No LE edema Wounds-Sternal and LLE wounds are clean and dry.  Diagnostic Tests: CLINICAL DATA:  72 y/o  F; CABG 11/17/2018.  EXAM: CHEST - 2 VIEW  COMPARISON:  11/24/2018 chest radiograph  FINDINGS: Stable cardiac silhouette given projection and technique. Post CABG with sternotomy wires in alignment. Resolved right and diminished small left pleural effusions. Left basilar opacity is likely associated atelectasis. No new consolidation. No pneumothorax. No acute osseous abnormality is evident.  IMPRESSION: Resolved right and diminished small left pleural effusions. Left basilar opacity is likely associated atelectasis.   Electronically Signed   By: Mitzi HansenLance  Furusawa-Stratton M.D.   On: 12/15/2018 14:48  Impression and Plan: Overall, patient is slowly recovering from coronary artery bypass grafting surgery. She has already seen Berton BonJanine hammond NP from cardiology on 12/23.  Her last INR was on 12/09 and she is taking 5 mg and 2.5  mg of Coumadin (Factor V Leiden deficiency). I discussed the multitude of reasons patient may be fatigued. These include deconditioning, medications (Amidoarone, although decreased to daily and Lopressor), anemia, and it has only been 4 weeks since surgery. Hopefully, in time fatigue will improve but we may need to make adjustments to her medication. Regarding diarrhea, unknown cause but I told the patient she can take Imodium. If diarrhea persists, she should see her medical doctor, which she has an appointment to see for follow  up labs (anemia) and treatment of diabetes. We discussed depression is very common after heart surgery, but again as she continues to recover and feel better, symptoms should improve.  Patient was instructed that as long as she is no longer taking narcotics, she may begin driving short distances (I.e. 30 minutes or less during the day) and increase the frequency and duration as tolerates. She was encouraged to participate in cardiac rehab, which our office will refer her to. Finally, she was instructed to continue with sternal precautions (I.e. no lifting more than 10 pounds for the next 2-3 weeks). She will return to see Dr. Laneta SimmersBartle in a few weeks.    Ardelle Ballsonielle M , PA-C [facility] [phone]

## 2018-12-19 NOTE — Telephone Encounter (Signed)
Called and spoke with pt husband [name] who stated pt was sleeping, but will have the pt give [place] a call later.  If we do not hear from pt today will f/u with her in a week.

## 2018-12-22 NOTE — Progress Notes (Signed)
[facility] - Clinic Note  12/23/2018     CHIEF COMPLAINT Patient presents for Retina Follow Up   HISTORY OF PRESENT ILLNESS: Adriana [name] is a 73 y.o. female who presents to the clinic today for:   HPI    Retina Follow Up    Patient presents with  Other.  In left eye.  Severity is moderate.  Duration of 4.5 months.  Since onset it is stable.  I, the attending physician,  performed the HPI with the patient and updated documentation appropriately.          Comments    Patient states vision the same OU. Had open heart surgery 11-17-2018.        Last edited by Rennis ChrisZamora, Zoiee Wimmer, MD on 12/23/2018  3:16 PM. (History)    pt states she recently had open heart sx, she states she had a quintuple bypass, she states her vision is still doing okay, pt states she starts rehab in February  Referring physician: Adrian PrinceSouth, Stephen, MD [address] [place], [place] [id number]  HISTORICAL INFORMATION:   Selected notes from the MEDICAL RECORD NUMBER Referred by Ronney LionStephanie Edwards, NP for DM exam LEE:  Ocular Hx- PMH-depression, HTN, high cholesterol, stroke, DM (taking metformin)    CURRENT MEDICATIONS: No current outpatient medications on file. (Ophthalmic Drugs)   No current facility-administered medications for this visit.  (Ophthalmic Drugs)   Current Outpatient Medications (Other)  Medication Sig  . acetaminophen (TYLENOL) 500 MG tablet Take 1 tablet (500 mg total) by mouth every 6 (six) hours as needed for moderate pain (or Fever >/= 101).  Marland Kitchen. amiodarone (PACERONE) 200 MG tablet Take 1 tablet (200 mg total) by mouth daily.  Marland Kitchen. atorvastatin (LIPITOR) 20 MG tablet Take 1 tablet (20 mg total) by mouth daily at 6 PM.  . buPROPion (WELLBUTRIN XL) 300 MG 24 hr tablet Take 300 mg by mouth daily.   . calcium carbonate (ANTACID) 500 MG chewable tablet Chew 2 tablets by mouth daily as needed for indigestion or heartburn.  . Cholecalciferol (VITAMIN D) 2000 units CAPS Take  2,000 Units by mouth daily.   . clobetasol cream (TEMOVATE) 0.05 % Apply 1 application topically daily as needed (for psoriasis).   . Continuous Blood Gluc Sensor (FREESTYLE LIBRE 14 DAY SENSOR) MISC 1 each by Does not apply route every 14 (fourteen) days. Change every 2 weeks  . Insulin Glargine (BASAGLAR KWIKPEN) 100 UNIT/ML SOPN Inject 0.15 mLs (15 Units total) into the skin at bedtime.  . insulin lispro (HUMALOG) 100 UNIT/ML injection Inject 0.06 mLs (6 Units total) into the skin 3 (three) times daily before meals.  Marland Kitchen. lisinopril (PRINIVIL,ZESTRIL) 5 MG tablet Take 5 mg by mouth daily.  . metFORMIN (GLUCOPHAGE) 1000 MG tablet Take 1,000 mg by mouth 2 (two) times daily with a meal.  . metoprolol tartrate (LOPRESSOR) 25 MG tablet Take 1 tablet (25 mg total) by mouth 2 (two) times daily.  . promethazine (PHENERGAN) 25 MG tablet Take 12.5 mg by mouth every evening.   . warfarin (COUMADIN) 5 MG tablet Take 5 mg by mouth every evening.   . white petrolatum (VASELINE) GEL Apply 1 application topically daily as needed (for psoriasis).  . potassium citrate (UROCIT-K) 10 MEQ (1080 MG) SR tablet Take 10 mEq by mouth 2 (two) times daily.    No current facility-administered medications for this visit.  (Other)      REVIEW OF SYSTEMS: ROS    Positive for: Endocrine, Cardiovascular, Eyes,  Psychiatric   Negative for: Constitutional, Gastrointestinal, Neurological, Skin, Genitourinary, Musculoskeletal, HENT, Respiratory, Allergic/Imm, Heme/Lymph   Last edited by Annalee Genta D on 12/23/2018  2:38 PM. (History)       ALLERGIES Allergies  Allergen Reactions  . Anesthesia S-I-60 Nausea And Vomiting    Confused BP goes up and down.  . Aspirin Nausea And Vomiting    PAST MEDICAL HISTORY Past Medical History:  Diagnosis Date  . Abdominal pain 05/01/2018  . AKI (acute kidney injury) (HCC) 05/02/2018  . Cellulitis of right leg 05/01/2018  . Coronary artery disease   . Depression    denies  . DVT  (deep venous thrombosis) (HCC)   . Dyspnea    "anytime"  . Essential hypertension   . Factor V Leiden mutation (HCC)   . GERD (gastroesophageal reflux disease)   . Heart burn   . History of kidney stones   . HLD (hyperlipidemia)   . Myocardial infarction (HCC)   . Pneumonia    x2  . PONV (postoperative nausea and vomiting)    blood pressure up/down, confusion  . Poorly controlled type 2 diabetes mellitus (HCC)   . 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 Sepsis (HCC) 05/01/2018  . Sleep apnea   . 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 ABDOMINAL HYSTERECTOMY    . BACK SURGERY    . Carotid artery endarterectomy     right and left side with stents  . CATARACT EXTRACTION Bilateral   . CHOLECYSTECTOMY    . CORONARY ARTERY BYPASS GRAFT N/A 11/17/2018   Procedure: CORONARY ARTERY BYPASS GRAFTING (CABG), ON PUMP, TIMES FIVE, USING LEFT INTERNAL MAMMARY ARTERY AND ENDOSCOPICALLY HARVESTED LEFT GREATER SAPHENOUS VEIN;  Surgeon: Alleen Borne, MD;  Location: [facility] OR;  Service: Open Heart Surgery;  Laterality: N/A;  . LEFT HEART CATH AND CORONARY ANGIOGRAPHY N/A 11/04/2018   Procedure: LEFT HEART CATH AND CORONARY ANGIOGRAPHY;  Surgeon: [name], MD;  Location: [facility] INVASIVE CV LAB;  Service: Cardiovascular;  Laterality: N/A;  . LITHOTRIPSY     x3  . TEE WITHOUT CARDIOVERSION N/A 11/17/2018   Procedure: TRANSESOPHAGEAL ECHOCARDIOGRAM (TEE);  Surgeon: Alleen Borne, MD;  Location: [facility] OR;  Service: Open Heart Surgery;  Laterality: N/A;  . TONSILLECTOMY    . trigger fingers left and right Bilateral   . TUBAL LIGATION      FAMILY HISTORY Family History  Problem Relation Age of Onset  . Stroke Mother   . Lung cancer Father   . COPD Brother   . Diabetes Brother   . Cataracts Maternal Grandmother   . Diabetes Maternal Grandmother   . Amblyopia Neg Hx   .  Blindness Neg Hx   . Glaucoma Neg Hx   . Hypertension Neg Hx   . Macular degeneration Neg Hx   . Retinal detachment Neg Hx   . Strabismus Neg Hx   . Retinitis pigmentosa Neg Hx     SOCIAL HISTORY Social History   Tobacco Use  . Smoking status: Former Smoker    Last attempt to quit: 2004    Years since quitting: 16.0  . Smokeless tobacco: Never Used  Substance Use Topics  . Alcohol use: Not Currently  . Drug use: Not Currently         OPHTHALMIC EXAM:  Base Eye Exam    Visual Acuity (Snellen - Linear)  Right Left   Dist [place] 20/40 -1 20/40   Dist ph [place] 20/25 NI       Tonometry (Tonopen, 2:46 PM)      Right Left   Pressure 21 19       Pupils      Dark Light Shape React APD   Right 4 2 Round Brisk None   Left 4 2 Round Brisk None       Visual Fields (Counting fingers)      Left Right    Full Full       Extraocular Movement      Right Left    Full, Ortho Full, Ortho       Neuro/Psych    Oriented x3:  Yes   Mood/Affect:  Normal       Dilation    Both eyes:  1.0% Mydriacyl, 2.5% Phenylephrine @ 2:46 PM        Slit Lamp and Fundus Exam    Slit Lamp Exam      Right Left   Lids/Lashes Dermatochalasis - upper lid, Meibomian gland dysfunction; inspissated meibomian gland nasal upper lid Dermatochalasis - upper lid, Meibomian gland dysfunction   Conjunctiva/Sclera White and quiet White and quiet   Cornea Well healed cataract wounds, sub epi scar at 1200, Arcus Well healed cataract wounds, Arcus   Anterior Chamber Deep and quiet Deep and quiet   Iris Round and dilated, No NVI Round and dilated, No NVI   Lens Tecnis multifocal PC IOL in good position Tecnis multifocal PC IOL in good position   Vitreous Vitreous syneresis, Posterior vitreous detachment Vitreous syneresis       Fundus Exam      Right Left   Disc Pink and Sharp Pink and Sharp   C/D Ratio 0.5 0.4   Macula Blunted foveal reflex, retinal drusen, Retinal pigment epithelial mottling, No  heme or edema Blunted foveal reflex, Epiretinal membrane, drusen, Retinal pigment epithelial mottling, no heme or edema   Vessels Vascular attenuation Vascular attenuation   Periphery Attached, inferior cobblestoning, chorioretinal atrophy at 1030, mild reticular degeneration, no heme Attached, scattered cobblestoning / chorioretinal atrophy, no reticular degeneration, no heme          IMAGING AND PROCEDURES  Imaging and Procedures for @TODAY @  OCT, Retina - OU - Both Eyes       Right Eye Quality was good. Central Foveal Thickness: 269. Progression has been stable. Findings include normal foveal contour, no IRF, no SRF, retinal drusen  (Mild drusen).   Left Eye Quality was good. Central Foveal Thickness: 343. Progression has been stable. Findings include abnormal foveal contour, retinal drusen , no IRF, no SRF, epiretinal membrane, pigment epithelial detachment, macular pucker (Mild interval progression of pucker).   Notes *Images captured and stored on drive  Diagnosis / Impression:  OD: NFP, No IRF/SRF OS: ERM with abnormal foveal contour -- mild interval progression of pucker nasally  Clinical management:  See below  Abbreviations: NFP - Normal foveal profile. CME - cystoid macular edema. PED - pigment epithelial detachment. IRF - intraretinal fluid. SRF - subretinal fluid. EZ - ellipsoid zone. ERM - epiretinal membrane. ORA - outer retinal atrophy. ORT - outer retinal tubulation. SRHM - subretinal hyper-reflective material                  ASSESSMENT/PLAN:    ICD-10-CM   1. Diabetes mellitus type 2 without retinopathy (HCC) E11.9   2. Epiretinal membrane (ERM) of left eye H35.372  3. Retinal edema H35.81 OCT, Retina - OU - Both Eyes  4. Pseudophakia of both eyes Z96.1   5. 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 Diabetes mellitus, type 2 without retinopathy - The incidence, risk factors for progression, natural history and  treatment options for diabetic retinopathy  were discussed with patient.   - The need for close monitoring of blood glucose, blood pressure, and serum lipids, avoiding cigarette or any type of tobacco, and the need for long term follow up was also discussed with patient. - f/u in 1 year, sooner prn  2. Epiretinal membrane OS-  - The natural history, anatomy, potential for loss of vision, and treatment options including vitrectomy techniques and the complications of endophthalmitis, retinal detachment, vitreous hemorrhage, cataract progression and permanent vision loss discussed with the patient. - not visually significant -- no surgical intervention indicated at this time - BCVA stable at 20/40 - OCT shows mild progression of macular pucker - F/U 3-4 months, DFE, OCT  3. No retinal edema on exam or OCT  4. Pseudophakia OU  - s/p CE/IOL OU -- Tecnis multifocal IOLs by Dr. Gershon CullSheel Patel  - beautiful surgeries, doing well  - monitor  5. MGD OU - pt particularly bothered by inspissated MG nasal upper lid OD - advised hot compresses BID OU - pt to call back for oculoplastics referral if lesion persists   Ophthalmic Meds Ordered this visit:  No orders of the defined types were placed in this encounter.      Return for f/u 3-4 months ERM OS, DFE, OCT.  There are no Patient Instructions on file for this visit.   Explained the diagnoses, plan, and follow up with the patient and they expressed understanding.  Patient expressed understanding of the importance of proper follow up care.   This document serves as a record of services personally performed by Karie ChimeraBrian G. Darlette Dubow, MD, PhD. It was created on their behalf by Laurian BrimAmanda Brown, OA, an ophthalmic assistant. The creation of this record is the provider's dictation and/or activities during the visit.    Electronically signed by: Laurian BrimAmanda Brown, OA  01.06.2020 10:43 PM     Karie ChimeraBrian G. Quintan Saldivar, M.D., Ph.D. Diseases & Surgery of the Retina and  Vitreous [facility]   I have reviewed the above documentation for accuracy and completeness, and I agree with the above. Karie ChimeraBrian G. Keerat Denicola, M.D., Ph.D. 12/23/18 10:43 PM    Abbreviations: M myopia (nearsighted); A astigmatism; H hyperopia (farsighted); P presbyopia; Mrx spectacle prescription;  CTL contact lenses; OD right eye; OS left eye; OU both eyes  XT exotropia; ET esotropia; PEK punctate epithelial keratitis; PEE punctate epithelial erosions; DES dry eye syndrome; MGD meibomian gland dysfunction; ATs artificial tears; PFAT's preservative free artificial tears; NSC nuclear sclerotic cataract; PSC posterior subcapsular cataract; ERM epi-retinal membrane; PVD posterior vitreous detachment; RD retinal detachment; DM diabetes mellitus; DR diabetic retinopathy; NPDR non-proliferative diabetic retinopathy; PDR proliferative diabetic retinopathy; CSME clinically significant macular edema; DME diabetic macular edema; dbh dot blot hemorrhages; CWS cotton wool spot; POAG primary open angle glaucoma; C/D cup-to-disc ratio; HVF humphrey visual field; GVF goldmann visual field; OCT optical coherence tomography; IOP intraocular pressure; BRVO Branch retinal vein occlusion; CRVO central retinal vein occlusion; CRAO central retinal artery occlusion; BRAO branch retinal artery occlusion; RT retinal tear; SB scleral buckle; PPV pars plana vitrectomy; VH Vitreous hemorrhage; PRP panretinal laser photocoagulation; IVK intravitreal kenalog; VMT vitreomacular  traction; MH Macular hole;  NVD neovascularization of the disc; NVE neovascularization elsewhere; AREDS age related eye disease study; ARMD age related macular degeneration; POAG primary open angle glaucoma; EBMD epithelial/anterior basement membrane dystrophy; ACIOL anterior chamber intraocular lens; IOL intraocular lens; PCIOL posterior chamber intraocular lens; Phaco/IOL phacoemulsification with intraocular lens placement; [place] photorefractive  keratectomy; LASIK laser assisted in situ keratomileusis; HTN hypertension; DM diabetes mellitus; COPD chronic obstructive pulmonary disease

## 2018-12-23 ENCOUNTER — Encounter (INDEPENDENT_AMBULATORY_CARE_PROVIDER_SITE_OTHER): Payer: Self-pay | Admitting: Ophthalmology

## 2018-12-23 ENCOUNTER — Ambulatory Visit (INDEPENDENT_AMBULATORY_CARE_PROVIDER_SITE_OTHER): Payer: Federal, State, Local not specified - PPO | Admitting: Ophthalmology

## 2018-12-23 DIAGNOSIS — H3581 Retinal edema: Secondary | ICD-10-CM

## 2018-12-23 DIAGNOSIS — H02886 Meibomian gland dysfunction of left eye, unspecified eyelid: Secondary | ICD-10-CM

## 2018-12-23 DIAGNOSIS — E119 Type 2 diabetes mellitus without complications: Secondary | ICD-10-CM

## 2018-12-23 DIAGNOSIS — H02883 Meibomian gland dysfunction of right eye, unspecified eyelid: Secondary | ICD-10-CM

## 2018-12-23 DIAGNOSIS — Z961 Presence of intraocular lens: Secondary | ICD-10-CM | POA: Diagnosis not present

## 2018-12-23 DIAGNOSIS — H35372 Puckering of macula, left eye: Secondary | ICD-10-CM

## 2019-01-14 ENCOUNTER — Encounter: Payer: Self-pay | Admitting: Surgery

## 2019-01-14 ENCOUNTER — Ambulatory Visit: Payer: Self-pay | Admitting: Surgery

## 2019-01-14 ENCOUNTER — Ambulatory Visit (INDEPENDENT_AMBULATORY_CARE_PROVIDER_SITE_OTHER): Payer: Self-pay | Admitting: Surgery

## 2019-01-14 VITALS — BP 103/73 | HR 108 | Resp 20 | Ht 66.0 in | Wt 142.0 lb

## 2019-01-14 DIAGNOSIS — I2510 Atherosclerotic heart disease of native coronary artery without angina pectoris: Secondary | ICD-10-CM

## 2019-01-14 DIAGNOSIS — Z951 Presence of aortocoronary bypass graft: Secondary | ICD-10-CM

## 2019-01-14 NOTE — Progress Notes (Signed)
HPI: Patient returns for routine postoperative follow-up having undergone coronary artery bypass graft surgery x5 on 11/17/2018. The patient's early postoperative recovery while in the hospital was notable for development of postoperative atrial fibrillation that was converted with amiodarone. Since hospital discharge the patient reports that she has been feeling well.  She has had no further chest pain or shortness of breath.  She still has some fatigue but is walking every day and looking forward to cardiac rehabilitation.   Current Outpatient Medications  Medication Sig Dispense Refill  . acetaminophen (TYLENOL) 500 MG tablet Take 1 tablet (500 mg total) by mouth every 6 (six) hours as needed for moderate pain (or Fever >/= 101). 20 tablet 0  . amiodarone (PACERONE) 200 MG tablet Take 1 tablet (200 mg total) by mouth daily. 90 tablet 3  . atorvastatin (LIPITOR) 20 MG tablet Take 1 tablet (20 mg total) by mouth daily at 6 PM. 30 tablet 1  . buPROPion (WELLBUTRIN XL) 300 MG 24 hr tablet Take 300 mg by mouth daily.   2  . calcium carbonate (ANTACID) 500 MG chewable tablet Chew 2 tablets by mouth daily as needed for indigestion or heartburn.    . Cholecalciferol (VITAMIN D) 2000 units CAPS Take 2,000 Units by mouth daily.     . clobetasol cream (TEMOVATE) 0.05 % Apply 1 application topically daily as needed (for psoriasis).     . Continuous Blood Gluc Sensor (FREESTYLE LIBRE 14 DAY SENSOR) MISC 1 each by Does not apply route every 14 (fourteen) days. Change every 2 weeks 2 each 11  . Insulin Glargine (BASAGLAR KWIKPEN) 100 UNIT/ML SOPN Inject 0.15 mLs (15 Units total) into the skin at bedtime. 4.5 mL 0  . insulin lispro (HUMALOG) 100 UNIT/ML injection Inject 0.06 mLs (6 Units total) into the skin 3 (three) times daily before meals. 10 mL 11  . lisinopril (PRINIVIL,ZESTRIL) 5 MG tablet Take 5 mg by mouth daily.    . metFORMIN (GLUCOPHAGE) 1000 MG tablet Take 1,000 mg by mouth 2 (two) times  daily with a meal.    . metoprolol tartrate (LOPRESSOR) 25 MG tablet Take 1 tablet (25 mg total) by mouth 2 (two) times daily. 60 tablet 1  . potassium citrate (UROCIT-K) 10 MEQ (1080 MG) SR tablet Take 10 mEq by mouth 2 (two) times daily.     . promethazine (PHENERGAN) 25 MG tablet Take 12.5 mg by mouth every evening.   1  . warfarin (COUMADIN) 5 MG tablet Take 5 mg by mouth every evening.     . white petrolatum (VASELINE) GEL Apply 1 application topically daily as needed (for psoriasis).     No current facility-administered medications for this visit.     Physical Exam: BP 103/73   Pulse (!) 108   Resp 20   Ht 5\' 6"  (1.676 m)   Wt 142 lb (64.4 kg)   SpO2 95% Comment: RA  BMI 22.92 kg/m  She looks well. Cardiac exam shows a regular rate and rhythm with normal heart sounds. Lung exam is clear. The chest incision is healing well and sternum is stable. Her leg incisions are healing well and there is no lower extremity edema.  Diagnostic Tests:  CLINICAL DATA:  73 y/o  F; CABG 11/17/2018.  EXAM: CHEST - 2 VIEW  COMPARISON:  11/24/2018 chest radiograph  FINDINGS: Stable cardiac silhouette given projection and technique. Post CABG with sternotomy wires in alignment. Resolved right and diminished small left pleural effusions. Left basilar  opacity is likely associated atelectasis. No new consolidation. No pneumothorax. No acute osseous abnormality is evident.  IMPRESSION: Resolved right and diminished small left pleural effusions. Left basilar opacity is likely associated atelectasis.   Electronically Signed   By: Mitzi Hansen M.D.   On: 12/15/2018 14:48  Impression:  Overall I think she is making good progress following coronary bypass graft surgery.  She appears to be maintaining sinus rhythm and I told her to discontinue the amiodarone.  I told her that she could return to driving a car but should refrain lifting anything heavier than 10 pounds for 3  months following surgery.  She is looking forward to cardiac rehabilitation.  Hopefully this will improve her stamina.  If she remains fatigued then we could consider decreasing her Lopressor or getting her off lisinopril since her blood pressure is in the low normal range.  Plan:  She will continue to follow-up with Dr. Evlyn Kanner and Dr. Excell Seltzer and will return to see me if she has any problems with her incisions.   Alleen Borne, MD [facility] [phone]

## 2019-01-22 ENCOUNTER — Other Ambulatory Visit: Payer: Self-pay | Admitting: Surgery

## 2019-01-27 ENCOUNTER — Telehealth: Payer: Self-pay | Admitting: Cardiovascular Disease

## 2019-01-27 MED ORDER — ATORVASTATIN CALCIUM 20 MG PO TABS: 20.0000 mg | ORAL_TABLET | Freq: Every day | ORAL | 3 refills | Status: DC

## 2019-01-27 NOTE — Telephone Encounter (Signed)
Pt's husband is calling requesting a refill on atorvastatin. This medication was prescribed in the hospital. Would Dr. Excell Seltzer like to refill this medication? Please address

## 2019-01-27 NOTE — Telephone Encounter (Signed)
Atorvastatin Rx called in.

## 2019-01-28 ENCOUNTER — Telehealth (HOSPITAL_COMMUNITY): Payer: Self-pay

## 2019-01-28 NOTE — Telephone Encounter (Signed)
Cardiac Rehab Medication Review by a Pharmacist  Does the patient  feel that his/her medications are working for him/her?  yes  Has the patient been experiencing any side effects to the medications prescribed?  no  Does the patient measure his/her own blood pressure or blood glucose at home?  yes  BP: 120/58 Glucose: <110 - often does not need to take Humalog with meals - reports only taking 2-3 times per week  Does the patient have any problems obtaining medications due to transportation or finances?   no  Understanding of regimen: good Understanding of indications: good Potential of compliance: good   Pharmacist comments:   Amiodarone discontinued by Dr. [name] 01/14/19  Danae Orleans, PharmD PGY1 Pharmacy Resident Phone [phone] 01/28/2019       10:29 AM

## 2019-02-01 NOTE — Progress Notes (Signed)
Adriana [name] 73 y.o. female DOB 02/27/1946 MRN [id number]       Nutrition Screen Note  No diagnosis found. Past Medical History:  Diagnosis Date  . Abdominal pain 05/01/2018  . AKI (acute kidney injury) (HCC) 05/02/2018  . Cellulitis of right leg 05/01/2018  . Coronary artery disease   . Depression    denies  . DVT (deep venous thrombosis) (HCC)   . Dyspnea    "anytime"  . Essential hypertension   . Factor V Leiden mutation (HCC)   . GERD (gastroesophageal reflux disease)   . Heart burn   . History of kidney stones   . HLD (hyperlipidemia)   . Myocardial infarction (HCC)   . Pneumonia    x2  . PONV (postoperative nausea and vomiting)    blood pressure up/down, confusion  . Poorly controlled type 2 diabetes mellitus (HCC)   . 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 Sepsis (HCC) 05/01/2018  . Sleep apnea   . 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    Current Outpatient Medications (Endocrine & Metabolic):  Marland Kitchen  Insulin Glargine (BASAGLAR KWIKPEN) 100 UNIT/ML SOPN, Inject 0.15 mLs (15 Units total) into the skin at bedtime. .  insulin lispro (HUMALOG) 100 UNIT/ML injection, Inject 0.06 mLs (6 Units total) into the skin 3 (three) times daily before meals. .  metFORMIN (GLUCOPHAGE) 1000 MG tablet, Take 1,000 mg by mouth 2 (two) times daily with a meal.  Current Outpatient Medications (Cardiovascular):  .  amiodarone (PACERONE) 200 MG tablet, Take 1 tablet (200 mg total) by mouth daily. (Patient not taking: Reported on 01/28/2019) .  atorvastatin (LIPITOR) 20 MG tablet, Take 1 tablet (20 mg total) by mouth daily at 6 PM. .  lisinopril (PRINIVIL,ZESTRIL) 5 MG tablet, Take 5 mg by mouth daily. .  metoprolol tartrate (LOPRESSOR) 25 MG tablet, Take 1 tablet (25 mg total) by mouth 2 (two) times daily.  Current Outpatient Medications (Respiratory):  .  promethazine (PHENERGAN) 25 MG tablet,  Take 12.5 mg by mouth every evening.   Current Outpatient Medications (Analgesics):  .  acetaminophen (TYLENOL) 500 MG tablet, Take 1 tablet (500 mg total) by mouth every 6 (six) hours as needed for moderate pain (or Fever >/= 101). (Patient not taking: Reported on 01/28/2019)  Current Outpatient Medications (Hematological):  .  warfarin (COUMADIN) 5 MG tablet, Take by mouth See admin instructions. Take 5 mg every Monday and Friday; 2.5 mg all other days of the week  Current Outpatient Medications (Other):  Marland Kitchen  buPROPion (WELLBUTRIN XL) 300 MG 24 hr tablet, Take 300 mg by mouth daily.  .  calcium carbonate (ANTACID) 500 MG chewable tablet, Chew 2 tablets by mouth daily as needed for indigestion or heartburn. .  Cholecalciferol (VITAMIN D) 2000 units CAPS, Take 2,000 Units by mouth daily.  .  clobetasol cream (TEMOVATE) 0.05 %, Apply 1 application topically daily as needed (for psoriasis).  .  Continuous Blood Gluc Sensor (FREESTYLE LIBRE 14 DAY SENSOR) MISC, 1 each by Does not apply route every 14 (fourteen) days. Change every 2 weeks .  potassium citrate (UROCIT-K) 10 MEQ (1080 MG) SR tablet, Take 10 mEq by mouth 2 (two) times daily.  .  white petrolatum (VASELINE) GEL, Apply 1 application topically daily as needed (for psoriasis).   HT: Ht Readings from Last 1 Encounters:  01/14/19 5\' 6"  (1.676 m)    WT: Wt Readings from Last 5  Encounters:  01/14/19 142 lb (64.4 kg)  12/15/18 147 lb (66.7 kg)  12/08/18 150 lb 12.8 oz (68.4 kg)  11/24/18 152 lb 3.2 oz (69 kg)  11/12/18 153 lb 14.1 oz (69.8 kg)     BMI = 22.92   Current tobacco use? No       Labs:  Lipid Panel     Component Value Date/Time   CHOL 83 05/05/2018 0316   TRIG 93 05/05/2018 0316   HDL 31 (L) 05/05/2018 0316   CHOLHDL 2.7 05/05/2018 0316   VLDL 19 05/05/2018 0316   LDLCALC 33 05/05/2018 0316    Lab Results  Component Value Date   HGBA1C 8.5 (H) 11/12/2018   CBG (last 3)  No results for input(s): GLUCAP in  the last 72 hours.  Nutrition Diagnosis ? Food-and nutrition-related knowledge deficit related to lack of exposure to information as related to diagnosis of: ? CVD ? Type 2 Diabetes  Nutrition Goal(s):  ? To be determined  Plan:  Pt to attend nutrition classes ? Nutrition I ? Nutrition II ? Portion Distortion  ? Diabetes Blitz ? Diabetes Q & A Will provide client-centered nutrition education as part of interdisciplinary care.   Monitor and evaluate progress toward nutrition goal with team.  Ross Marcus, MS, RD, LDN 02/01/2019 12:50 PM

## 2019-02-05 ENCOUNTER — Ambulatory Visit (HOSPITAL_COMMUNITY): Payer: Federal, State, Local not specified - PPO

## 2019-02-05 ENCOUNTER — Encounter (HOSPITAL_COMMUNITY)
Admission: RE | Admit: 2019-02-05 | Discharge: 2019-02-05 | Disposition: A | Discharge status: Home / Self Care | Payer: Federal, State, Local not specified - PPO | Source: Ambulatory Visit | Attending: Cardiovascular Disease | Admitting: Cardiovascular Disease

## 2019-02-05 ENCOUNTER — Encounter (HOSPITAL_COMMUNITY): Payer: Self-pay

## 2019-02-05 VITALS — BP 108/80 | HR 70 | Ht 65.25 in | Wt 147.3 lb

## 2019-02-05 DIAGNOSIS — Z951 Presence of aortocoronary bypass graft: Secondary | ICD-10-CM | POA: Diagnosis present

## 2019-02-05 NOTE — Progress Notes (Signed)
Cardiac Individual Treatment Plan  Patient Details  Name: Maralyn SagoVictoria Leigh Pryde MRN: [id number] Date of Birth: 09-10-1946 Referring Provider:     CARDIAC REHAB PHASE II ORIENTATION [facility]05/2019 [facility]  Referring Provider  Dr. 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  acetaminophen (TYLENOL) 500 MG tablet, Take 1 tablet (500 mg total) by mouth every 6 (six) hours as needed for moderate pain (or Fever >/= 101). (Patient not taking: Reported on 01/28/2019), Disp: 20 tablet, Rfl: 0 .  amiodarone (PACERONE) 200 MG tablet, Take 1 tablet (200 mg total) by mouth daily. (Patient not taking: Reported on 01/28/2019), Disp: 90 tablet, Rfl: 3 .  atorvastatin (LIPITOR) 20 MG tablet, Take 1 tablet (20 mg total) by mouth daily at 6 PM., Disp: 90 tablet, Rfl: 3 .  buPROPion (WELLBUTRIN XL) 300 MG 24 hr tablet, Take 300 mg by mouth daily. , Disp: , Rfl: 2 .  calcium carbonate (ANTACID) 500 MG chewable tablet, Chew 2 tablets by mouth daily as needed for indigestion or heartburn., Disp: , Rfl:  .  Cholecalciferol (VITAMIN D) 2000 units CAPS, Take 2,000 Units by mouth daily. , Disp: , Rfl:  .  clobetasol cream (TEMOVATE) 0.05 %, Apply 1 application topically daily as needed (for psoriasis). , Disp: , Rfl:  .  Continuous Blood Gluc Sensor (FREESTYLE LIBRE 14 DAY SENSOR) MISC, 1 each by Does not apply route every 14 (fourteen) days. Change every 2 weeks, Disp: 2 each, Rfl: 11 .  Insulin Glargine (BASAGLAR KWIKPEN) 100 UNIT/ML SOPN, Inject 0.15 mLs (15 Units total) into the skin at bedtime., Disp: 4.5 mL, Rfl: 0 .  insulin lispro (HUMALOG) 100 UNIT/ML injection, Inject 0.06 mLs (6 Units total) into the skin 3 (three) times daily before  meals., Disp: 10 mL, Rfl: 11 .  lisinopril (PRINIVIL,ZESTRIL) 5 MG tablet, Take 5 mg by mouth daily., Disp: , Rfl:  .  metFORMIN (GLUCOPHAGE) 1000 MG tablet, Take 1,000 mg by mouth 2 (two) times daily with a meal., Disp: , Rfl:  .  metoprolol tartrate (LOPRESSOR) 25 MG tablet, Take 1 tablet (25 mg total) by mouth 2 (two) times daily., Disp: 60 tablet, Rfl: 1 .  potassium citrate (UROCIT-K) 10 MEQ (1080 MG) SR tablet, Take 10 mEq by mouth 2 (two) times daily. , Disp: , Rfl:  .  promethazine (PHENERGAN) 25 MG tablet, Take 12.5 mg by mouth every evening. , Disp: , Rfl: 1 .  warfarin (COUMADIN) 5 MG tablet, Take by mouth See admin instructions. Take 5 mg every Monday and Friday; 2.5 mg all other days of the week, Disp: , Rfl:  .  white petrolatum (VASELINE) GEL, Apply 1 application topically daily as needed (for psoriasis)., Disp: , Rfl:   Past Medical History: Past Medical History:  Diagnosis Date  . Abdominal pain 05/01/2018  . AKI (acute kidney injury) (HCC) 05/02/2018  . Cellulitis of right leg 05/01/2018  . Coronary artery disease   . Depression    denies  . DVT (deep venous thrombosis) (HCC)   . Dyspnea    "anytime"  . Essential hypertension   . Factor V Leiden mutation (HCC)   . GERD (gastroesophageal reflux disease)   . Heart burn   .  History of kidney stones   . HLD (hyperlipidemia)   . Myocardial infarction (HCC)   . Pneumonia    x2  . PONV (postoperative nausea and vomiting)    blood pressure up/down, confusion  . Poorly controlled type 2 diabetes mellitus (HCC)   . 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 Sepsis (HCC) 05/01/2018  . Sleep apnea   . 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 Last attempt to quit: 2004  . Years since quitting: 16.1  Smokeless Tobacco Never Used    Labs: Recent Review Flowsheet Data    Labs  for ITP Cardiac and Pulmonary Rehab Latest Ref Rng & Units 11/17/2018 11/17/2018 11/17/2018 11/17/2018 11/18/2018   Cholestrol 0 - 200 mg/dL - - - - -   LDLCALC 0 - 99 mg/dL - - - - -   HDL >54 mg/dL - - - - -   Trlycerides <150 mg/dL - - - - -   Hemoglobin A1c 4.8 - 5.6 % - - - - -   PHART 7.350 - 7.450 7.377 7.271(L) - 7.309(L) -   PCO2ART 32.0 - 48.0 mmHg 36.7 47.5 - 48.1(H) -   HCO3 20.0 - 28.0 mmol/L 21.8 21.9 - 24.2 -   TCO2 22 - 32 mmol/L 23 23 25 26 24    ACIDBASEDEF 0.0 - 2.0 mmol/L 3.0(H) 5.0(H) - 2.0 -   O2SAT % 94.0 98.0 - 98.0 -      Capillary Blood Glucose: Lab Results  Component Value Date   GLUCAP 155 (H) 11/24/2018   GLUCAP 155 (H) 11/24/2018   GLUCAP 79 11/24/2018   GLUCAP 217 (H) 11/23/2018   GLUCAP 137 (H) 11/23/2018     Exercise Target Goals: Exercise Program Goal: Individual exercise prescription set using results from initial 6 min walk test and THRR while considering  patient's activity barriers and safety.   Exercise Prescription Goal: Initial exercise prescription builds to 30-45 minutes a day of aerobic activity, 2-3 days per week.  Home exercise guidelines will be given to patient during program as part of exercise prescription that the participant will acknowledge.  Activity Barriers & Risk Stratification: Activity Barriers & Cardiac Risk Stratification - 02/05/19 1439      Activity Barriers & Cardiac Risk Stratification   Activity Barriers  Muscular Weakness;Deconditioning;Shortness of Breath    Cardiac Risk Stratification  High       6 Minute Walk: 6 Minute Walk    Row Name 02/05/19 1438         6 Minute Walk   Phase  Initial     Distance  1756 feet     Walk Time  6 minutes     # of Rest Breaks  0     MPH  3.3     METS  3.7     RPE  12     Perceived Dyspnea   1     VO2 Peak  13.02     Symptoms  Yes (comment)     Comments  Calf pain 4/10, Mild SOB     Resting HR  70 bpm     Resting BP  108/80     Resting Oxygen Saturation   97 %      Exercise Oxygen Saturation  during 6 min walk  97 %     Max Ex. HR  104 bpm     Max Ex. BP  118/64     2 Minute Post BP  104/74        Oxygen Initial Assessment:   Oxygen Re-Evaluation:   Oxygen Discharge (Final Oxygen Re-Evaluation):   Initial Exercise Prescription: Initial Exercise Prescription - 02/05/19 1400      Date of Initial Exercise RX and Referring Provider   Date  02/05/19    Referring Provider  Dr. Excell Seltzer    Expected Discharge Date  05/15/19      Recumbant Bike   Level  1.5    Watts  35    Minutes  10    METs  3.69      NuStep   Level  2    SPM  75    Minutes  10    METs  3.5      Track   Laps  13    Minutes  10    METs  3.3      Prescription Details   Frequency (times per week)  2    Duration  Progress to 30 minutes of continuous aerobic without signs/symptoms of physical distress      Intensity   THRR 40-80% of Max Heartrate  60-119    Ratings of Perceived Exertion  11-13      Progression   Progression  Continue to progress workloads to maintain intensity without signs/symptoms of physical distress.      Resistance Training   Training Prescription  Yes    Weight  2 lbs.     Reps  10-15       Perform Capillary Blood Glucose checks as needed.  Exercise Prescription Changes:   Exercise Comments:   Exercise Goals and Review: Exercise Goals    Row Name 02/05/19 1352             Exercise Goals   Increase Physical Activity  Yes       Intervention  Provide advice, education, support and counseling about physical activity/exercise needs.;Develop an individualized exercise prescription for aerobic and resistive training based on initial evaluation findings, risk stratification, comorbidities and participant's personal goals.       Expected Outcomes  Short Term: Attend rehab on a regular basis to increase amount of physical activity.;Long Term: Add in home exercise to make exercise part of routine and to increase amount of physical  activity.;Long Term: Exercising regularly at least 3-5 days a week.       Increase Strength and Stamina  Yes       Intervention  Provide advice, education, support and counseling about physical activity/exercise needs.;Develop an individualized exercise prescription for aerobic and resistive training based on initial evaluation findings, risk stratification, comorbidities and participant's personal goals.       Expected Outcomes  Short Term: Increase workloads from initial exercise prescription for resistance, speed, and METs.;Short Term: Perform resistance training exercises routinely during rehab and add in resistance training at home;Long Term: Improve cardiorespiratory fitness, muscular endurance and strength as measured by increased METs and functional capacity ( )       Able to understand and use rate of perceived exertion (RPE) scale  Yes       Intervention  Provide education and explanation on how to use RPE scale       Expected Outcomes  Short Term: Able to use RPE daily in rehab to express subjective intensity level;Long Term:  Able to use RPE to guide intensity level when exercising independently  Knowledge and understanding of Target Heart Rate Range (THRR)  Yes       Intervention  Provide education and explanation of THRR including how the numbers were predicted and where they are located for reference       Expected Outcomes  Short Term: Able to state/look up THRR;Short Term: Able to use daily as guideline for intensity in rehab;Long Term: Able to use THRR to govern intensity when exercising independently       Able to check pulse independently  Yes       Intervention  Provide education and demonstration on how to check pulse in carotid and radial arteries.;Review the importance of being able to check your own pulse for safety during independent exercise       Expected Outcomes  Short Term: Able to explain why pulse checking is important during independent exercise;Long Term: Able  to check pulse independently and accurately       Understanding of Exercise Prescription  Yes       Intervention  Provide education, explanation, and written materials on patient's individual exercise prescription       Expected Outcomes  Short Term: Able to explain program exercise prescription;Long Term: Able to explain home exercise prescription to exercise independently          Exercise Goals Re-Evaluation :   Discharge Exercise Prescription (Final Exercise Prescription Changes):   Nutrition:  Target Goals: Understanding of nutrition guidelines, daily intake of sodium 1500mg , cholesterol 200mg , calories 30% from fat and 7% or less from saturated fats, daily to have 5 or more servings of fruits and vegetables.  Biometrics: Pre Biometrics - 02/05/19 1443      Pre Biometrics   Height  5' 5.25" (1.657 m)    Weight  66.8 kg    Waist Circumference  35 inches    Hip Circumference  36.5 inches    Waist to Hip Ratio  0.96 %    BMI (Calculated)  24.33    Triceps Skinfold  40 mm    % Body Fat  39.3 %    Grip Strength  23.5 kg    Flexibility  12 in    Single Leg Stand  30 seconds        Nutrition Therapy Plan and Nutrition Goals: Nutrition Therapy & Goals - 02/05/19 1513      Nutrition Therapy   Diet  heart healthy, carb modified      Personal Nutrition Goals   Nutrition Goal  Pt to identify and limit food sources of saturated fat, trans fat, refined carbohydrates and sodium    Personal Goal #2  Pt to build a healthy plate including vegetables, fruits, whole grains, and low-fat dairy products in a heart healthy meal plan.    Personal Goal #3  Pt to go easy on snack chips and crackers    Personal Goal #4  Pt able to name foods that affect blood glucose      Intervention Plan   Intervention  Prescribe, educate and counsel regarding individualized specific dietary modifications aiming towards targeted core components such as weight, hypertension, lipid management, diabetes,  heart failure and other comorbidities.    Expected Outcomes  Short Term Goal: Understand basic principles of dietary content, such as calories, fat, sodium, cholesterol and nutrients.;Long Term Goal: Adherence to prescribed nutrition plan.       Nutrition Assessments: Nutrition Assessments - 02/05/19 1514      MEDFICTS Scores   Pre Score  38  Nutrition Goals Re-Evaluation: Nutrition Goals Re-Evaluation    Row Name 02/05/19 1513             Goals   Current Weight  147 lb 4.3 oz (66.8 kg)          Nutrition Goals Re-Evaluation: Nutrition Goals Re-Evaluation    Row Name 02/05/19 1513             Goals   Current Weight  147 lb 4.3 oz (66.8 kg)          Nutrition Goals Discharge (Final Nutrition Goals Re-Evaluation): Nutrition Goals Re-Evaluation - 02/05/19 1513      Goals   Current Weight  147 lb 4.3 oz (66.8 kg)       Psychosocial: Target Goals: Acknowledge presence or absence of significant depression and/or stress, maximize coping skills, provide positive support system. Participant is able to verbalize types and ability to use techniques and skills needed for reducing stress and depression.  Initial Review & Psychosocial Screening: Initial Psych Review & Screening - 02/05/19 1340      Initial Review   Current issues with  None Identified      Family Dynamics   Good Support System?  Yes   Chip Boer has her husband and children for support     Barriers   Psychosocial barriers to participate in program  There are no identifiable barriers or psychosocial needs.      Screening Interventions   Interventions  Encouraged to exercise       Quality of Life Scores:  Scores of 19 and below usually indicate a poorer quality of life in these areas.  A difference of  2-3 points is a clinically meaningful difference.  A difference of 2-3 points in the total score of the Quality of Life Index has been associated with significant improvement in overall quality of  life, self-image, physical symptoms, and general health in studies assessing change in quality of life.  PHQ-9: Recent Review Flowsheet Data    Depression screen [facility] 2/9 11/07/2018   Decreased Interest 0   Down, Depressed, Hopeless 0   PHQ - 2 Score 0     Interpretation of Total Score  Total Score Depression Severity:  1-4 = Minimal depression, 5-9 = Mild depression, 10-14 = Moderate depression, 15-19 = Moderately severe depression, 20-27 = Severe depression   Psychosocial Evaluation and Intervention:   Psychosocial Re-Evaluation:   Psychosocial Discharge (Final Psychosocial Re-Evaluation):   Vocational Rehabilitation: Provide vocational rehab assistance to qualifying candidates.   Vocational Rehab Evaluation & Intervention: Vocational Rehab - 02/05/19 1511      Initial Vocational Rehab Evaluation & Intervention   Assessment shows need for Vocational Rehabilitation  No   Chip Boer is retired and does not need vocational rehab at this time      Education: Education Goals: Education classes will be provided on a weekly basis, covering required topics. Participant will state understanding/return demonstration of topics presented.  Learning Barriers/Preferences: Learning Barriers/Preferences - 02/05/19 1353      Learning Barriers/Preferences   Learning Barriers  Sight    Learning Preferences  Skilled Demonstration;Verbal Instruction;Written Material       Education Topics: Count Your Pulse:  -Group instruction provided by verbal instruction, demonstration, patient participation and written materials to support subject.  Instructors address importance of being able to find your pulse and how to count your pulse when at home without a heart monitor.  Patients get hands on experience counting their pulse with staff help and  individually.   Heart Attack, Angina, and Risk Factor Modification:  -Group instruction provided by verbal instruction, video, and written materials to  support subject.  Instructors address signs and symptoms of angina and heart attacks.    Also discuss risk factors for heart disease and how to make changes to improve heart health risk factors.   Functional Fitness:  -Group instruction provided by verbal instruction, demonstration, patient participation, and written materials to support subject.  Instructors address safety measures for doing things around the house.  Discuss how to get up and down off the floor, how to pick things up properly, how to safely get out of a chair without assistance, and balance training.   Meditation and Mindfulness:  -Group instruction provided by verbal instruction, patient participation, and written materials to support subject.  Instructor addresses importance of mindfulness and meditation practice to help reduce stress and improve awareness.  Instructor also leads participants through a meditation exercise.    Stretching for Flexibility and Mobility:  -Group instruction provided by verbal instruction, patient participation, and written materials to support subject.  Instructors lead participants through series of stretches that are designed to increase flexibility thus improving mobility.  These stretches are additional exercise for major muscle groups that are typically performed during regular warm up and cool down.   Hands Only CPR:  -Group verbal, video, and participation provides a basic overview of AHA guidelines for community CPR. Role-play of emergencies allow participants the opportunity to practice calling for help and chest compression technique with discussion of AED use.   Hypertension: -Group verbal and written instruction that provides a basic overview of hypertension including the most recent diagnostic guidelines, risk factor reduction with self-care instructions and medication management.    Nutrition I class: Heart Healthy Eating:  -Group instruction provided by PowerPoint slides, verbal  discussion, and written materials to support subject matter. The instructor gives an explanation and review of the Therapeutic Lifestyle Changes diet recommendations, which includes a discussion on lipid goals, dietary fat, sodium, fiber, plant stanol/sterol esters, sugar, and the components of a well-balanced, healthy diet.   Nutrition II class: Lifestyle Skills:  -Group instruction provided by PowerPoint slides, verbal discussion, and written materials to support subject matter. The instructor gives an explanation and review of label reading, grocery shopping for heart health, heart healthy recipe modifications, and ways to make healthier choices when eating out.   Diabetes Question & Answer:  -Group instruction provided by PowerPoint slides, verbal discussion, and written materials to support subject matter. The instructor gives an explanation and review of diabetes co-morbidities, pre- and post-prandial blood glucose goals, pre-exercise blood glucose goals, signs, symptoms, and treatment of hypoglycemia and hyperglycemia, and foot care basics.   Diabetes Blitz:  -Group instruction provided by PowerPoint slides, verbal discussion, and written materials to support subject matter. The instructor gives an explanation and review of the physiology behind type 1 and type 2 diabetes, diabetes medications and rational behind using different medications, pre- and post-prandial blood glucose recommendations and Hemoglobin A1c goals, diabetes diet, and exercise including blood glucose guidelines for exercising safely.    Portion Distortion:  -Group instruction provided by PowerPoint slides, verbal discussion, written materials, and food models to support subject matter. The instructor gives an explanation of serving size versus portion size, changes in portions sizes over the last 20 years, and what consists of a serving from each food group.   Stress Management:  -Group instruction provided by verbal  instruction, video, and written materials to  support subject matter.  Instructors review role of stress in heart disease and how to cope with stress positively.     Exercising on Your Own:  -Group instruction provided by verbal instruction, power point, and written materials to support subject.  Instructors discuss benefits of exercise, components of exercise, frequency and intensity of exercise, and end points for exercise.  Also discuss use of nitroglycerin and activating EMS.  Review options of places to exercise outside of rehab.  Review guidelines for sex with heart disease.   Cardiac Drugs I:  -Group instruction provided by verbal instruction and written materials to support subject.  Instructor reviews cardiac drug classes: antiplatelets, anticoagulants, beta blockers, and statins.  Instructor discusses reasons, side effects, and lifestyle considerations for each drug class.   Cardiac Drugs II:  -Group instruction provided by verbal instruction and written materials to support subject.  Instructor reviews cardiac drug classes: angiotensin converting enzyme inhibitors (ACE-I), angiotensin II receptor blockers (ARBs), nitrates, and calcium channel blockers.  Instructor discusses reasons, side effects, and lifestyle considerations for each drug class.   Anatomy and Physiology of the Circulatory System:  Group verbal and written instruction and models provide basic cardiac anatomy and physiology, with the coronary electrical and arterial systems. Review of: AMI, Angina, Valve disease, Heart Failure, Peripheral Artery Disease, Cardiac Arrhythmia, Pacemakers, and the ICD.   Other Education:  -Group or individual verbal, written, or video instructions that support the educational goals of the cardiac rehab program.   Holiday Eating Survival Tips:  -Group instruction provided by PowerPoint slides, verbal discussion, and written materials to support subject matter. The instructor gives patients  tips, tricks, and techniques to help them not only survive but enjoy the holidays despite the onslaught of food that accompanies the holidays.   Knowledge Questionnaire Score: Knowledge Questionnaire Score - 02/05/19 1432      Knowledge Questionnaire Score   Pre Score  19/24       Core Components/Risk Factors/Patient Goals at Admission: Personal Goals and Risk Factors at Admission - 02/05/19 1353      Core Components/Risk Factors/Patient Goals on Admission    Weight Management  Yes;Weight Loss    Intervention  Weight Management: Develop a combined nutrition and exercise program designed to reach desired caloric intake, while maintaining appropriate intake of nutrient and fiber, sodium and fats, and appropriate energy expenditure required for the weight goal.;Weight Management: Provide education and appropriate resources to help participant work on and attain dietary goals.;Weight Management/Obesity: Establish reasonable short term and long term weight goals.    Admit Weight  147 lb 4.3 oz (66.8 kg)    Goal Weight: Long Term  137 lb (62.1 kg)    Expected Outcomes  Short Term: Continue to assess and modify interventions until short term weight is achieved;Long Term: Adherence to nutrition and physical activity/exercise program aimed toward attainment of established weight goal;Weight Loss: Understanding of general recommendations for a balanced deficit meal plan, which promotes 1-2 lb weight loss per week and includes a negative energy balance of [phone] kcal/d;Understanding recommendations for meals to include 15-35% energy as protein, 25-35% energy from fat, 35-60% energy from carbohydrates, less than 200mg  of dietary cholesterol, 20-35 gm of total fiber daily;Understanding of distribution of calorie intake throughout the day with the consumption of 4-5 meals/snacks    Diabetes  Yes    Intervention  Provide education about signs/symptoms and action to take for hypo/hyperglycemia.;Provide  education about proper nutrition, including hydration, and aerobic/resistive exercise prescription along with prescribed medications  to achieve blood glucose in normal ranges: Fasting glucose 65-99 mg/dL    Expected Outcomes  Short Term: Participant verbalizes understanding of the signs/symptoms and immediate care of hyper/hypoglycemia, proper foot care and importance of medication, aerobic/resistive exercise and nutrition plan for blood glucose control.;Long Term: Attainment of HbA1C < 7%.    Hypertension  Yes    Intervention  Provide education on lifestyle modifcations including regular physical activity/exercise, weight management, moderate sodium restriction and increased consumption of fresh fruit, vegetables, and low fat dairy, alcohol moderation, and smoking cessation.;Monitor prescription use compliance.    Expected Outcomes  Short Term: Continued assessment and intervention until BP is < 140/20mm HG in hypertensive participants. < 130/55mm HG in hypertensive participants with diabetes, heart failure or chronic kidney disease.;Long Term: Maintenance of blood pressure at goal levels.    Lipids  Yes    Intervention  Provide education and support for participant on nutrition & aerobic/resistive exercise along with prescribed medications to achieve LDL 70mg , HDL >40mg .    Expected Outcomes  Short Term: Participant states understanding of desired cholesterol values and is compliant with medications prescribed. Participant is following exercise prescription and nutrition guidelines.;Long Term: Cholesterol controlled with medications as prescribed, with individualized exercise RX and with personalized nutrition plan. Value goals: LDL < 70mg , HDL > 40 mg.       Core Components/Risk Factors/Patient Goals Review:    Core Components/Risk Factors/Patient Goals at Discharge (Final Review):    ITP Comments: ITP Comments    Row Name 02/05/19 1352           ITP Comments  Dr. Armanda Magic, Medical  Director           Comments: Chip Boer attended orientation from 1321 to 1435 to review rules and guidelines for program. Completed 6 minute walk test, Intitial ITP, and exercise prescription.  VSS. Telemetry-Sinus Rhtyhm. Chip Boer complained of mild calf pain and mild shortness of breath this resolved with rest during her walk test.Gerasimos Plotts Harlon Flor, RN,BSN 02/05/2019 3:26 PM

## 2019-02-05 NOTE — Progress Notes (Signed)
Adriana [name] 73 y.o. female DOB: 1946/02/24 MRN: [id number]      Nutrition Note  1. 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 Abdominal pain 05/01/2018  . AKI (acute kidney injury) (HCC) 05/02/2018  . Cellulitis of right leg 05/01/2018  . Coronary artery disease   . Depression    denies  . DVT (deep venous thrombosis) (HCC)   . Dyspnea    "anytime"  . Essential hypertension   . Factor V Leiden mutation (HCC)   . GERD (gastroesophageal reflux disease)   . Heart burn   . History of kidney stones   . HLD (hyperlipidemia)   . Myocardial infarction (HCC)   . Pneumonia    x2  . PONV (postoperative nausea and vomiting)    blood pressure up/down, confusion  . Poorly controlled type 2 diabetes mellitus (HCC)   . 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 Sepsis (HCC) 05/01/2018  . Sleep apnea   . 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   Current Outpatient Medications (Endocrine & Metabolic):  Marland Kitchen  Insulin Glargine (BASAGLAR KWIKPEN) 100 UNIT/ML SOPN, Inject 0.15 mLs (15 Units total) into the skin at bedtime. .  insulin lispro (HUMALOG) 100 UNIT/ML injection, Inject 0.06 mLs (6 Units total) into the skin 3 (three) times daily before meals. .  metFORMIN (GLUCOPHAGE) 1000 MG tablet, Take 1,000 mg by mouth 2 (two) times daily with a meal.  Current Outpatient Medications (Cardiovascular):  .  amiodarone (PACERONE) 200 MG tablet, Take 1 tablet (200 mg total) by mouth daily. (Patient not taking: Reported on 01/28/2019) .  atorvastatin (LIPITOR) 20 MG tablet, Take 1 tablet (20 mg total) by mouth daily at 6 PM. .  lisinopril (PRINIVIL,ZESTRIL) 5 MG tablet, Take 5 mg by mouth daily. .  metoprolol tartrate (LOPRESSOR) 25 MG tablet, Take 1 tablet (25 mg total) by mouth 2 (two) times daily.  Current Outpatient Medications (Respiratory):  .  promethazine (PHENERGAN) 25 MG tablet,  Take 12.5 mg by mouth every evening.   Current Outpatient Medications (Analgesics):  .  acetaminophen (TYLENOL) 500 MG tablet, Take 1 tablet (500 mg total) by mouth every 6 (six) hours as needed for moderate pain (or Fever >/= 101). (Patient not taking: Reported on 01/28/2019)  Current Outpatient Medications (Hematological):  .  warfarin (COUMADIN) 5 MG tablet, Take by mouth See admin instructions. Take 5 mg every Monday and Friday; 2.5 mg all other days of the week  Current Outpatient Medications (Other):  Marland Kitchen  buPROPion (WELLBUTRIN XL) 300 MG 24 hr tablet, Take 300 mg by mouth daily.  .  calcium carbonate (ANTACID) 500 MG chewable tablet, Chew 2 tablets by mouth daily as needed for indigestion or heartburn. .  Cholecalciferol (VITAMIN D) 2000 units CAPS, Take 2,000 Units by mouth daily.  .  clobetasol cream (TEMOVATE) 0.05 %, Apply 1 application topically daily as needed (for psoriasis).  .  Continuous Blood Gluc Sensor (FREESTYLE LIBRE 14 DAY SENSOR) MISC, 1 each by Does not apply route every 14 (fourteen) days. Change every 2 weeks .  potassium citrate (UROCIT-K) 10 MEQ (1080 MG) SR tablet, Take 10 mEq by mouth 2 (two) times daily.  .  white petrolatum (VASELINE) GEL, Apply 1 application topically daily as needed (for psoriasis).   HT: Ht Readings from Last 1 Encounters:  02/05/19 5' 5.25" (1.657 m)    WT: Wt Readings  from Last 5 Encounters:  02/05/19 147 lb 4.3 oz (66.8 kg)  01/14/19 142 lb (64.4 kg)  12/15/18 147 lb (66.7 kg)  12/08/18 150 lb 12.8 oz (68.4 kg)  11/24/18 152 lb 3.2 oz (69 kg)     Body mass index is 24.32 kg/m.   Current tobacco use? No  Labs:  Lipid Panel     Component Value Date/Time   CHOL 83 05/05/2018 0316   TRIG 93 05/05/2018 0316   HDL 31 (L) 05/05/2018 0316   CHOLHDL 2.7 05/05/2018 0316   VLDL 19 05/05/2018 0316   LDLCALC 33 05/05/2018 0316    Lab Results  Component Value Date   HGBA1C 8.5 (H) 11/12/2018   CBG (last 3)  No results for  input(s): GLUCAP in the last 72 hours.  Nutrition Note Spoke with pt. Nutrition plan and goals reviewed with pt. Pt is following Step 2 of the Therapeutic Lifestyle Changes diet. Pt wants to lose wt. Pt has not actively been trying to lose. Wt loss tips reviewed (label reading, how to build a healthy plate, portion sizes, eating frequently across the day). Pt has Type 2 Diabetes. Last A1c indicates blood glucose well-controlled. This Clinical research associate went over Diabetes Education test results. Pt checks CBG's 2 times a day. Fasting CBG's reportedly 90-140's mg/dL. Per discussion, pt does not use canned/convenience foods often. Pt does not add salt to food. Pt does not eat out frequently. Pt expressed understanding of the information reviewed. Pt aware of nutrition education classes offered and would like to attend nutrition classes.  Nutrition Diagnosis ? Food-and nutrition-related knowledge deficit related to lack of exposure to information as related to diagnosis of: ? CVD ? Type 2 Diabetes  Nutrition Intervention ? Pt's individual nutrition plan and goals reviewed with pt  Nutrition Goal(s):  ? Pt to identify and limit food sources of saturated fat, trans fat, refined carbohydrates and sodium ? Pt to build a healthy plate including vegetables, fruits, whole grains, and low-fat dairy products in a heart healthy meal plan. ? Pt to go easy on snack chips and crackers ? Pt able to name foods that affect blood glucose  Plan:  ? Pt to attend nutrition classes:  ? Nutrition I ? Nutrition II ? Portion Distortion   ? Diabetes Blitz ? Diabetes Q & Ae determined ? Will provide client-centered nutrition education as part of interdisciplinary care ? Monitor and evaluate progress toward nutrition goal with team.   Ross Marcus, MS, RD, LDN 02/05/2019 3:11 PM

## 2019-02-09 ENCOUNTER — Encounter (HOSPITAL_COMMUNITY): Payer: Federal, State, Local not specified - PPO

## 2019-02-09 ENCOUNTER — Ambulatory Visit (HOSPITAL_COMMUNITY): Payer: Federal, State, Local not specified - PPO

## 2019-02-11 ENCOUNTER — Encounter (HOSPITAL_COMMUNITY): Payer: Federal, State, Local not specified - PPO

## 2019-02-11 ENCOUNTER — Telehealth (HOSPITAL_COMMUNITY): Payer: Self-pay | Admitting: *Deleted

## 2019-02-11 ENCOUNTER — Ambulatory Visit (HOSPITAL_COMMUNITY): Payer: Federal, State, Local not specified - PPO

## 2019-02-13 ENCOUNTER — Ambulatory Visit (HOSPITAL_COMMUNITY): Payer: Federal, State, Local not specified - PPO

## 2019-02-13 ENCOUNTER — Encounter (HOSPITAL_COMMUNITY): Payer: Federal, State, Local not specified - PPO

## 2019-02-16 ENCOUNTER — Ambulatory Visit (HOSPITAL_COMMUNITY): Payer: Federal, State, Local not specified - PPO

## 2019-02-16 ENCOUNTER — Encounter (HOSPITAL_COMMUNITY): Payer: Federal, State, Local not specified - PPO

## 2019-02-18 ENCOUNTER — Encounter (HOSPITAL_COMMUNITY): Payer: Federal, State, Local not specified - PPO

## 2019-02-18 ENCOUNTER — Ambulatory Visit (HOSPITAL_COMMUNITY): Payer: Federal, State, Local not specified - PPO

## 2019-02-19 ENCOUNTER — Telehealth (HOSPITAL_COMMUNITY): Payer: Self-pay | Admitting: *Deleted

## 2019-02-20 ENCOUNTER — Ambulatory Visit (HOSPITAL_COMMUNITY): Payer: Federal, State, Local not specified - PPO

## 2019-02-20 ENCOUNTER — Encounter (HOSPITAL_COMMUNITY): Payer: Federal, State, Local not specified - PPO

## 2019-02-20 ENCOUNTER — Encounter (HOSPITAL_COMMUNITY): Payer: Self-pay | Admitting: *Deleted

## 2019-02-20 DIAGNOSIS — Z951 Presence of aortocoronary bypass graft: Secondary | ICD-10-CM

## 2019-02-20 NOTE — Progress Notes (Signed)
Cardiac Individual Treatment Plan  Patient Details  Name: Adriana [name] MRN: [id number] Date of Birth: 09-10-1946 Referring Provider:     CARDIAC REHAB PHASE II OR[facility]ATION [facility]05/2019 [facility]  Referring Provider  Dr. 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  acetaminophen (TYLENOL) 500 MG tablet, Take 1 tablet (500 mg total) by mouth every 6 (six) hours as needed for moderate pain (or Fever >/= 101). (Patient not taking: Reported on 01/28/2019), Disp: 20 tablet, Rfl: 0 .  amiodarone (PACERONE) 200 MG tablet, Take 1 tablet (200 mg total) by mouth daily. (Patient not taking: Reported on 01/28/2019), Disp: 90 tablet, Rfl: 3 .  atorvastatin (LIPITOR) 20 MG tablet, Take 1 tablet (20 mg total) by mouth daily at 6 PM., Disp: 90 tablet, Rfl: 3 .  buPROPion (WELLBUTRIN XL) 300 MG 24 hr tablet, Take 300 mg by mouth daily. , Disp: , Rfl: 2 .  calcium carbonate (ANTACID) 500 MG chewable tablet, Chew 2 tablets by mouth daily as needed for indigestion or heartburn., Disp: , Rfl:  .  Cholecalciferol (VITAMIN D) 2000 units CAPS, Take 2,000 Units by mouth daily. , Disp: , Rfl:  .  clobetasol cream (TEMOVATE) 0.05 %, Apply 1 application topically daily as needed (for psoriasis). , Disp: , Rfl:  .  Continuous Blood Gluc Sensor (FREESTYLE LIBRE 14 DAY SENSOR) MISC, 1 each by Does not apply route every 14 (fourteen) days. Change every 2 weeks, Disp: 2 each, Rfl: 11 .  Insulin Glargine (BASAGLAR KWIKPEN) 100 UNIT/ML SOPN, Inject 0.15 mLs (15 Units total) into the skin at bedtime., Disp: 4.5 mL, Rfl: 0 .  insulin lispro (HUMALOG) 100 UNIT/ML injection, Inject 0.06 mLs (6 Units total) into the skin 3 (three) times daily before  meals., Disp: 10 mL, Rfl: 11 .  lisinopril (PRINIVIL,ZESTRIL) 5 MG tablet, Take 5 mg by mouth daily., Disp: , Rfl:  .  metFORMIN (GLUCOPHAGE) 1000 MG tablet, Take 1,000 mg by mouth 2 (two) times daily with a meal., Disp: , Rfl:  .  metoprolol tartrate (LOPRESSOR) 25 MG tablet, Take 1 tablet (25 mg total) by mouth 2 (two) times daily., Disp: 60 tablet, Rfl: 1 .  potassium citrate (UROCIT-K) 10 MEQ (1080 MG) SR tablet, Take 10 mEq by mouth 2 (two) times daily. , Disp: , Rfl:  .  promethazine (PHENERGAN) 25 MG tablet, Take 12.5 mg by mouth every evening. , Disp: , Rfl: 1 .  warfarin (COUMADIN) 5 MG tablet, Take by mouth See admin instructions. Take 5 mg every Monday and Friday; 2.5 mg all other days of the week, Disp: , Rfl:  .  white petrolatum (VASELINE) GEL, Apply 1 application topically daily as needed (for psoriasis)., Disp: , Rfl:   Past Medical History: Past Medical History:  Diagnosis Date  . Abdominal pain 05/01/2018  . AKI (acute kidney injury) (HCC) 05/02/2018  . Cellulitis of right leg 05/01/2018  . Coronary artery disease   . Depression    denies  . DVT (deep venous thrombosis) (HCC)   . Dyspnea    "anytime"  . Essential hypertension   . Factor V Leiden mutation (HCC)   . GERD (gastroesophageal reflux disease)   . Heart burn   .  History of kidney stones   . HLD (hyperlipidemia)   . Myocardial infarction (HCC)   . Pneumonia    x2  . PONV (postoperative nausea and vomiting)    blood pressure up/down, confusion  . Poorly controlled type 2 diabetes mellitus (HCC)   . 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 Sepsis (HCC) 05/01/2018  . Sleep apnea   . 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 Last attempt to quit: 2004  . Years since quitting: 16.1  Smokeless Tobacco Never Used    Labs: Recent Review Flowsheet Data    Labs  for ITP Cardiac and Pulmonary Rehab Latest Ref Rng & Units 11/17/2018 11/17/2018 11/17/2018 11/17/2018 11/18/2018   Cholestrol 0 - 200 mg/dL - - - - -   LDLCALC 0 - 99 mg/dL - - - - -   HDL >54 mg/dL - - - - -   Trlycerides <150 mg/dL - - - - -   Hemoglobin A1c 4.8 - 5.6 % - - - - -   PHART 7.350 - 7.450 7.377 7.271(L) - 7.309(L) -   PCO2ART 32.0 - 48.0 mmHg 36.7 47.5 - 48.1(H) -   HCO3 20.0 - 28.0 mmol/L 21.8 21.9 - 24.2 -   TCO2 22 - 32 mmol/L 23 23 25 26 24    ACIDBASEDEF 0.0 - 2.0 mmol/L 3.0(H) 5.0(H) - 2.0 -   O2SAT % 94.0 98.0 - 98.0 -      Capillary Blood Glucose: Lab Results  Component Value Date   GLUCAP 155 (H) 11/24/2018   GLUCAP 155 (H) 11/24/2018   GLUCAP 79 11/24/2018   GLUCAP 217 (H) 11/23/2018   GLUCAP 137 (H) 11/23/2018     Exercise Target Goals: Exercise Program Goal: Individual exercise prescription set using results from initial 6 min walk test and THRR while considering  patient's activity barriers and safety.   Exercise Prescription Goal: Initial exercise prescription builds to 30-45 minutes a day of aerobic activity, 2-3 days per week.  Home exercise guidelines will be given to patient during program as part of exercise prescription that the participant will acknowledge.  Activity Barriers & Risk Stratification: Activity Barriers & Cardiac Risk Stratification - 02/05/19 1439      Activity Barriers & Cardiac Risk Stratification   Activity Barriers  Muscular Weakness;Deconditioning;Shortness of Breath    Cardiac Risk Stratification  High       6 Minute Walk: 6 Minute Walk    Row Name 02/05/19 1438         6 Minute Walk   Phase  Initial     Distance  1756 feet     Walk Time  6 minutes     # of Rest Breaks  0     MPH  3.3     METS  3.7     RPE  12     Perceived Dyspnea   1     VO2 Peak  13.02     Symptoms  Yes (comment)     Comments  Calf pain 4/10, Mild SOB     Resting HR  70 bpm     Resting BP  108/80     Resting Oxygen Saturation   97 %      Exercise Oxygen Saturation  during 6 min walk  97 %     Max Ex. HR  104 bpm     Max Ex. BP  118/64     2 Minute Post BP  104/74        Oxygen Initial Assessment:   Oxygen Re-Evaluation:   Oxygen Discharge (Final Oxygen Re-Evaluation):   Initial Exercise Prescription: Initial Exercise Prescription - 02/05/19 1400      Date of Initial Exercise RX and Referring Provider   Date  02/05/19    Referring Provider  Dr. Excell Seltzer    Expected Discharge Date  05/15/19      Recumbant Bike   Level  1.5    Watts  35    Minutes  10    METs  3.69      NuStep   Level  2    SPM  75    Minutes  10    METs  3.5      Track   Laps  13    Minutes  10    METs  3.3      Prescription Details   Frequency (times per week)  2    Duration  Progress to 30 minutes of continuous aerobic without signs/symptoms of physical distress      Intensity   THRR 40-80% of Max Heartrate  60-119    Ratings of Perceived Exertion  11-13      Progression   Progression  Continue to progress workloads to maintain intensity without signs/symptoms of physical distress.      Resistance Training   Training Prescription  Yes    Weight  2 lbs.     Reps  10-15       Perform Capillary Blood Glucose checks as needed.  Exercise Prescription Changes:   Exercise Comments:   Exercise Goals and Review: Exercise Goals    Row Name 02/05/19 1352             Exercise Goals   Increase Physical Activity  Yes       Intervention  Provide advice, education, support and counseling about physical activity/exercise needs.;Develop an individualized exercise prescription for aerobic and resistive training based on initial evaluation findings, risk stratification, comorbidities and participant's personal goals.       Expected Outcomes  Short Term: Attend rehab on a regular basis to increase amount of physical activity.;Long Term: Add in home exercise to make exercise part of routine and to increase amount of physical  activity.;Long Term: Exercising regularly at least 3-5 days a week.       Increase Strength and Stamina  Yes       Intervention  Provide advice, education, support and counseling about physical activity/exercise needs.;Develop an individualized exercise prescription for aerobic and resistive training based on initial evaluation findings, risk stratification, comorbidities and participant's personal goals.       Expected Outcomes  Short Term: Increase workloads from initial exercise prescription for resistance, speed, and METs.;Short Term: Perform resistance training exercises routinely during rehab and add in resistance training at home;Long Term: Improve cardiorespiratory fitness, muscular endurance and strength as measured by increased METs and functional capacity ( )       Able to understand and use rate of perceived exertion (RPE) scale  Yes       Intervention  Provide education and explanation on how to use RPE scale       Expected Outcomes  Short Term: Able to use RPE daily in rehab to express subjective intensity level;Long Term:  Able to use RPE to guide intensity level when exercising independently  Knowledge and understanding of Target Heart Rate Range (THRR)  Yes       Intervention  Provide education and explanation of THRR including how the numbers were predicted and where they are located for reference       Expected Outcomes  Short Term: Able to state/look up THRR;Short Term: Able to use daily as guideline for intensity in rehab;Long Term: Able to use THRR to govern intensity when exercising independently       Able to check pulse independently  Yes       Intervention  Provide education and demonstration on how to check pulse in carotid and radial arteries.;Review the importance of being able to check your own pulse for safety during independent exercise       Expected Outcomes  Short Term: Able to explain why pulse checking is important during independent exercise;Long Term: Able  to check pulse independently and accurately       Understanding of Exercise Prescription  Yes       Intervention  Provide education, explanation, and written materials on patient's individual exercise prescription       Expected Outcomes  Short Term: Able to explain program exercise prescription;Long Term: Able to explain home exercise prescription to exercise independently          Exercise Goals Re-Evaluation :   Discharge Exercise Prescription (Final Exercise Prescription Changes):   Nutrition:  Target Goals: Understanding of nutrition guidelines, daily intake of sodium 1500mg , cholesterol 200mg , calories 30% from fat and 7% or less from saturated fats, daily to have 5 or more servings of fruits and vegetables.  Biometrics: Pre Biometrics - 02/05/19 1443      Pre Biometrics   Height  5' 5.25" (1.657 m)    Weight  147 lb 4.3 oz (66.8 kg)    Waist Circumference  35 inches    Hip Circumference  36.5 inches    Waist to Hip Ratio  0.96 %    BMI (Calculated)  24.33    Triceps Skinfold  40 mm    % Body Fat  39.3 %    Grip Strength  23.5 kg    Flexibility  12 in    Single Leg Stand  30 seconds        Nutrition Therapy Plan and Nutrition Goals: Nutrition Therapy & Goals - 02/05/19 1513      Nutrition Therapy   Diet  heart healthy, carb modified      Personal Nutrition Goals   Nutrition Goal  Pt to identify and limit food sources of saturated fat, trans fat, refined carbohydrates and sodium    Personal Goal #2  Pt to build a healthy plate including vegetables, fruits, whole grains, and low-fat dairy products in a heart healthy meal plan.    Personal Goal #3  Pt to go easy on snack chips and crackers    Personal Goal #4  Pt able to name foods that affect blood glucose      Intervention Plan   Intervention  Prescribe, educate and counsel regarding individualized specific dietary modifications aiming towards targeted core components such as weight, hypertension, lipid  management, diabetes, heart failure and other comorbidities.    Expected Outcomes  Short Term Goal: Understand basic principles of dietary content, such as calories, fat, sodium, cholesterol and nutrients.;Long Term Goal: Adherence to prescribed nutrition plan.       Nutrition Assessments: Nutrition Assessments - 02/05/19 1514      MEDFICTS Scores   Pre Score  38       Nutrition Goals Re-Evaluation: Nutrition Goals Re-Evaluation    Row Name 02/05/19 1513             Goals   Current Weight  147 lb 4.3 oz (66.8 kg)          Nutrition Goals Re-Evaluation: Nutrition Goals Re-Evaluation    Row Name 02/05/19 1513             Goals   Current Weight  147 lb 4.3 oz (66.8 kg)          Nutrition Goals Discharge (Final Nutrition Goals Re-Evaluation): Nutrition Goals Re-Evaluation - 02/05/19 1513      Goals   Current Weight  147 lb 4.3 oz (66.8 kg)       Psychosocial: Target Goals: Acknowledge presence or absence of significant depression and/or stress, maximize coping skills, provide positive support system. Participant is able to verbalize types and ability to use techniques and skills needed for reducing stress and depression.  Initial Review & Psychosocial Screening: Initial Psych Review & Screening - 02/05/19 1340      Initial Review   Current issues with  None Identified      Family Dynamics   Good Support System?  Yes   Chip Boer has her husband and children for support     Barriers   Psychosocial barriers to participate in program  There are no identifiable barriers or psychosocial needs.      Screening Interventions   Interventions  Encouraged to exercise       Quality of Life Scores:  Scores of 19 and below usually indicate a poorer quality of life in these areas.  A difference of  2-3 points is a clinically meaningful difference.  A difference of 2-3 points in the total score of the Quality of Life Index has been associated with significant improvement  in overall quality of life, self-image, physical symptoms, and general health in studies assessing change in quality of life.  PHQ-9: Recent Review Flowsheet Data    Depression screen [facility] 2/9 11/07/2018   Decreased Interest 0   Down, Depressed, Hopeless 0   PHQ - 2 Score 0     Interpretation of Total Score  Total Score Depression Severity:  1-4 = Minimal depression, 5-9 = Mild depression, 10-14 = Moderate depression, 15-19 = Moderately severe depression, 20-27 = Severe depression   Psychosocial Evaluation and Intervention:   Psychosocial Re-Evaluation:   Psychosocial Discharge (Final Psychosocial Re-Evaluation):   Vocational Rehabilitation: Provide vocational rehab assistance to qualifying candidates.   Vocational Rehab Evaluation & Intervention: Vocational Rehab - 02/05/19 1511      Initial Vocational Rehab Evaluation & Intervention   Assessment shows need for Vocational Rehabilitation  No   Chip Boer is retired and does not need vocational rehab at this time      Education: Education Goals: Education classes will be provided on a weekly basis, covering required topics. Participant will state understanding/return demonstration of topics presented.  Learning Barriers/Preferences: Learning Barriers/Preferences - 02/05/19 1353      Learning Barriers/Preferences   Learning Barriers  Sight    Learning Preferences  Skilled Demonstration;Verbal Instruction;Written Material       Education Topics: Count Your Pulse:  -Group instruction provided by verbal instruction, demonstration, patient participation and written materials to support subject.  Instructors address importance of being able to find your pulse and how to count your pulse when at home without a heart monitor.  Patients get hands on experience  counting their pulse with staff help and individually.   Heart Attack, Angina, and Risk Factor Modification:  -Group instruction provided by verbal instruction, video, and  written materials to support subject.  Instructors address signs and symptoms of angina and heart attacks.    Also discuss risk factors for heart disease and how to make changes to improve heart health risk factors.   Functional Fitness:  -Group instruction provided by verbal instruction, demonstration, patient participation, and written materials to support subject.  Instructors address safety measures for doing things around the house.  Discuss how to get up and down off the floor, how to pick things up properly, how to safely get out of a chair without assistance, and balance training.   Meditation and Mindfulness:  -Group instruction provided by verbal instruction, patient participation, and written materials to support subject.  Instructor addresses importance of mindfulness and meditation practice to help reduce stress and improve awareness.  Instructor also leads participants through a meditation exercise.    Stretching for Flexibility and Mobility:  -Group instruction provided by verbal instruction, patient participation, and written materials to support subject.  Instructors lead participants through series of stretches that are designed to increase flexibility thus improving mobility.  These stretches are additional exercise for major muscle groups that are typically performed during regular warm up and cool down.   Hands Only CPR:  -Group verbal, video, and participation provides a basic overview of AHA guidelines for community CPR. Role-play of emergencies allow participants the opportunity to practice calling for help and chest compression technique with discussion of AED use.   Hypertension: -Group verbal and written instruction that provides a basic overview of hypertension including the most recent diagnostic guidelines, risk factor reduction with self-care instructions and medication management.    Nutrition I class: Heart Healthy Eating:  -Group instruction provided by  PowerPoint slides, verbal discussion, and written materials to support subject matter. The instructor gives an explanation and review of the Therapeutic Lifestyle Changes diet recommendations, which includes a discussion on lipid goals, dietary fat, sodium, fiber, plant stanol/sterol esters, sugar, and the components of a well-balanced, healthy diet.   Nutrition II class: Lifestyle Skills:  -Group instruction provided by PowerPoint slides, verbal discussion, and written materials to support subject matter. The instructor gives an explanation and review of label reading, grocery shopping for heart health, heart healthy recipe modifications, and ways to make healthier choices when eating out.   Diabetes Question & Answer:  -Group instruction provided by PowerPoint slides, verbal discussion, and written materials to support subject matter. The instructor gives an explanation and review of diabetes co-morbidities, pre- and post-prandial blood glucose goals, pre-exercise blood glucose goals, signs, symptoms, and treatment of hypoglycemia and hyperglycemia, and foot care basics.   Diabetes Blitz:  -Group instruction provided by PowerPoint slides, verbal discussion, and written materials to support subject matter. The instructor gives an explanation and review of the physiology behind type 1 and type 2 diabetes, diabetes medications and rational behind using different medications, pre- and post-prandial blood glucose recommendations and Hemoglobin A1c goals, diabetes diet, and exercise including blood glucose guidelines for exercising safely.    Portion Distortion:  -Group instruction provided by PowerPoint slides, verbal discussion, written materials, and food models to support subject matter. The instructor gives an explanation of serving size versus portion size, changes in portions sizes over the last 20 years, and what consists of a serving from each food group.   Stress Management:  -Group  instruction provided by  verbal instruction, video, and written materials to support subject matter.  Instructors review role of stress in heart disease and how to cope with stress positively.     Exercising on Your Own:  -Group instruction provided by verbal instruction, power point, and written materials to support subject.  Instructors discuss benefits of exercise, components of exercise, frequency and intensity of exercise, and end points for exercise.  Also discuss use of nitroglycerin and activating EMS.  Review options of places to exercise outside of rehab.  Review guidelines for sex with heart disease.   Cardiac Drugs I:  -Group instruction provided by verbal instruction and written materials to support subject.  Instructor reviews cardiac drug classes: antiplatelets, anticoagulants, beta blockers, and statins.  Instructor discusses reasons, side effects, and lifestyle considerations for each drug class.   Cardiac Drugs II:  -Group instruction provided by verbal instruction and written materials to support subject.  Instructor reviews cardiac drug classes: angiotensin converting enzyme inhibitors (ACE-I), angiotensin II receptor blockers (ARBs), nitrates, and calcium channel blockers.  Instructor discusses reasons, side effects, and lifestyle considerations for each drug class.   Anatomy and Physiology of the Circulatory System:  Group verbal and written instruction and models provide basic cardiac anatomy and physiology, with the coronary electrical and arterial systems. Review of: AMI, Angina, Valve disease, Heart Failure, Peripheral Artery Disease, Cardiac Arrhythmia, Pacemakers, and the ICD.   Other Education:  -Group or individual verbal, written, or video instructions that support the educational goals of the cardiac rehab program.   Holiday Eating Survival Tips:  -Group instruction provided by PowerPoint slides, verbal discussion, and written materials to support subject  matter. The instructor gives patients tips, tricks, and techniques to help them not only survive but enjoy the holidays despite the onslaught of food that accompanies the holidays.   Knowledge Questionnaire Score: Knowledge Questionnaire Score - 02/05/19 1432      Knowledge Questionnaire Score   Pre Score  19/24       Core Components/Risk Factors/Patient Goals at Admission: Personal Goals and Risk Factors at Admission - 02/05/19 1353      Core Components/Risk Factors/Patient Goals on Admission    Weight Management  Yes;Weight Loss    Intervention  Weight Management: Develop a combined nutrition and exercise program designed to reach desired caloric intake, while maintaining appropriate intake of nutrient and fiber, sodium and fats, and appropriate energy expenditure required for the weight goal.;Weight Management: Provide education and appropriate resources to help participant work on and attain dietary goals.;Weight Management/Obesity: Establish reasonable short term and long term weight goals.    Admit Weight  147 lb 4.3 oz (66.8 kg)    Goal Weight: Long Term  137 lb (62.1 kg)    Expected Outcomes  Short Term: Continue to assess and modify interventions until short term weight is achieved;Long Term: Adherence to nutrition and physical activity/exercise program aimed toward attainment of established weight goal;Weight Loss: Understanding of general recommendations for a balanced deficit meal plan, which promotes 1-2 lb weight loss per week and includes a negative energy balance of [phone] kcal/d;Understanding recommendations for meals to include 15-35% energy as protein, 25-35% energy from fat, 35-60% energy from carbohydrates, less than 200mg  of dietary cholesterol, 20-35 gm of total fiber daily;Understanding of distribution of calorie intake throughout the day with the consumption of 4-5 meals/snacks    Diabetes  Yes    Intervention  Provide education about signs/symptoms and action to take  for hypo/hyperglycemia.;Provide education about proper nutrition, including hydration, and  aerobic/resistive exercise prescription along with prescribed medications to achieve blood glucose in normal ranges: Fasting glucose 65-99 mg/dL    Expected Outcomes  Short Term: Participant verbalizes understanding of the signs/symptoms and immediate care of hyper/hypoglycemia, proper foot care and importance of medication, aerobic/resistive exercise and nutrition plan for blood glucose control.;Long Term: Attainment of HbA1C < 7%.    Hypertension  Yes    Intervention  Provide education on lifestyle modifcations including regular physical activity/exercise, weight management, moderate sodium restriction and increased consumption of fresh fruit, vegetables, and low fat dairy, alcohol moderation, and smoking cessation.;Monitor prescription use compliance.    Expected Outcomes  Short Term: Continued assessment and intervention until BP is < 140/45mm HG in hypertensive participants. < 130/39mm HG in hypertensive participants with diabetes, heart failure or chronic kidney disease.;Long Term: Maintenance of blood pressure at goal levels.    Lipids  Yes    Intervention  Provide education and support for participant on nutrition & aerobic/resistive exercise along with prescribed medications to achieve LDL 70mg , HDL >40mg .    Expected Outcomes  Short Term: Participant states understanding of desired cholesterol values and is compliant with medications prescribed. Participant is following exercise prescription and nutrition guidelines.;Long Term: Cholesterol controlled with medications as prescribed, with individualized exercise RX and with personalized nutrition plan. Value goals: LDL < , HDL > 40 mg.       Core Components/Risk Factors/Patient Goals Review:    Core Components/Risk Factors/Patient Goals at Discharge (Final Review):    ITP Comments: ITP Comments    Row Name 02/05/19 1352 02/20/19 1205          ITP Comments  Dr. Armanda Magic, Medical Director   30 Day ITP Review. Mellie's exercise has been on hold due to a foot ulcer. Awaiting physician clearance for patient to resume exercise.         Comments: See ITP comments.Gladstone Lighter, RN,BSN 02/20/2019 12:09 PM

## 2019-02-23 ENCOUNTER — Encounter (HOSPITAL_COMMUNITY): Payer: Federal, State, Local not specified - PPO

## 2019-02-23 ENCOUNTER — Ambulatory Visit (HOSPITAL_COMMUNITY): Payer: Federal, State, Local not specified - PPO

## 2019-02-25 ENCOUNTER — Ambulatory Visit (HOSPITAL_COMMUNITY): Payer: Federal, State, Local not specified - PPO

## 2019-02-25 ENCOUNTER — Other Ambulatory Visit: Payer: Self-pay

## 2019-02-25 ENCOUNTER — Encounter (HOSPITAL_COMMUNITY)
Admission: RE | Admit: 2019-02-25 | Discharge: 2019-02-25 | Disposition: A | Discharge status: Home / Self Care | Payer: Federal, State, Local not specified - PPO | Source: Ambulatory Visit | Attending: Cardiovascular Disease | Admitting: Cardiovascular Disease

## 2019-02-25 DIAGNOSIS — Z951 Presence of aortocoronary bypass graft: Secondary | ICD-10-CM | POA: Insufficient documentation

## 2019-02-25 LAB — GLUCOSE, CAPILLARY
Glucose-Capillary: 262 mg/dL — ABNORMAL HIGH (ref 70–99)
Glucose-Capillary: 183 mg/dL — ABNORMAL HIGH (ref 70–99)

## 2019-02-25 NOTE — Progress Notes (Signed)
Daily Session Note  Patient Details  Name: Adriana [name] MRN: [id number] Date of Birth: 1946-05-10 Referring Provider:     CARDIAC REHAB PHASE II ORIENTATION from 02/05/2019 in [facility]  Referring Provider  Dr. Burt Knack      Encounter Date: 02/25/2019  Check In: Session Check In - 02/25/19 1522      Check-In   Supervising physician immediately available to respond to emergencies  [facility] immediately available    Physician(s)  Dr. Maylene Roes    Location  [facility]    Staff Present  Barnet Pall, RN, BSN;Brittany Durene Fruits, BS, ACSM CEP, Exercise Physiologist;Olinty Celesta Aver, MS, ACSM CEP, Exercise Physiologist    Medication changes reported      No    Fall or balance concerns reported     No    Tobacco Cessation  No Change    Warm-up and Cool-down  Performed as group-led instruction    Resistance Training Performed  No    VAD Patient?  No    PAD/SET Patient?  No      Pain Assessment   Currently in Pain?  No/denies    Multiple Pain Sites  No       Capillary Blood Glucose: Results for orders placed or performed during the hospital encounter of 02/25/19 (from the past 24 hour(s))  Glucose, capillary     Status: Abnormal   Collection Time: 02/25/19  3:04 PM  Result Value Ref Range   Glucose-Capillary 262 (H) 70 - 99 mg/dL  Glucose, capillary     Status: Abnormal   Collection Time: 02/25/19  3:56 PM  Result Value Ref Range   Glucose-Capillary 183 (H) 70 - 99 mg/dL    Exercise Prescription Changes - 02/25/19 1506      Response to Exercise   Blood Pressure (Admit)  102/62    Blood Pressure (Exercise)  122/80    Blood Pressure (Exit)  102/72    Heart Rate (Admit)  73 bpm    Heart Rate (Exercise)  94 bpm    Heart Rate (Exit)  72 bpm    Rating of Perceived Exertion (Exercise)  12    Symptoms  none    Duration  Progress to 30 minutes of  aerobic without signs/symptoms of physical distress    Intensity  THRR  unchanged      Progression   Progression  Continue to progress workloads to maintain intensity without signs/symptoms of physical distress.    Average METs  2.4      Resistance Training   Training Prescription  No   Relaxation day, no weights.     Interval Training   Interval Training  No      Recumbant Bike   Level  1.5    Minutes  10      NuStep   Level  2    SPM  75    Minutes  10    METs  2.2      Track   Laps  9    Minutes  10    METs  2.57       Social History   Tobacco Use  Smoking Status Former Smoker  . Last attempt to quit: 2004  . Years since quitting: 16.2  Smokeless Tobacco Never Used    Goals Met:  No report of cardiac concerns or symptoms  Goals Unmet:  Not Applicable  Comments: Adriana [name] started cardiac rehab today.  Pt tolerated light exercise without  difficulty. VSS, telemetry-Sinus Rhythm, asymptomatic.  Medication list reconciled. Pt denies barriers to medicaiton compliance.  PSYCHOSOCIAL ASSESSMENT:  PHQ-0. Pt exhibits positive coping skills, hopeful outlook with supportive family. No psychosocial needs identified at this time, no psychosocial interventions necessary.    Pt enjoys reading and sewing.   Pt oriented to exercise equipment and routine.    Understanding verbalized. QUALITY OF LIFE SCORE REVIEW  Pt completed Quality of Life survey as a participant in Cardiac Rehab. Scores 21.0 or below are considered low. Pt score very low in health and functioning area of her quality of life questionnaire Overall 22.94, Health and Function 19.40, socioeconomic 24.79, physiological and spiritual 24.50, family 28.80. Patient quality of life slightly altered by physical constraints which limits ability to perform as prior to recent cardiac illness. Adriana [name] reports feeling dissatisfied with her health due to her recent open heart surgery and diabetes. Adriana [name] also reports having low energy since her open heart surgery.  Offered emotional support and reassurance.   Will continue to monitor and intervene as necessary.  Adriana [name] received clearance to return to exercise as her foot ulcer has healed per Dr Baldwin [facility] office.Barnet Pall, RN,BSN 02/25/2019 4:34 PM    Dr. Fransico Him is Medical Director for Cardiac Rehab at [facility].

## 2019-02-27 ENCOUNTER — Encounter (HOSPITAL_COMMUNITY)
Admission: RE | Admit: 2019-02-27 | Discharge: 2019-02-27 | Disposition: A | Discharge status: Home / Self Care | Payer: Federal, State, Local not specified - PPO | Source: Ambulatory Visit | Attending: Cardiovascular Disease | Admitting: Cardiovascular Disease

## 2019-02-27 ENCOUNTER — Other Ambulatory Visit: Payer: Self-pay

## 2019-02-27 ENCOUNTER — Ambulatory Visit (HOSPITAL_COMMUNITY): Payer: Federal, State, Local not specified - PPO

## 2019-02-27 DIAGNOSIS — Z951 Presence of aortocoronary bypass graft: Secondary | ICD-10-CM

## 2019-02-27 LAB — GLUCOSE, CAPILLARY: Glucose-Capillary: 187 mg/dL — ABNORMAL HIGH (ref 70–99)

## 2019-03-02 ENCOUNTER — Encounter (HOSPITAL_COMMUNITY): Payer: Federal, State, Local not specified - PPO

## 2019-03-02 ENCOUNTER — Ambulatory Visit (HOSPITAL_COMMUNITY): Payer: Federal, State, Local not specified - PPO

## 2019-03-02 LAB — GLUCOSE, CAPILLARY: Glucose-Capillary: 256 mg/dL — ABNORMAL HIGH (ref 70–99)

## 2019-03-04 ENCOUNTER — Encounter (HOSPITAL_COMMUNITY): Payer: Federal, State, Local not specified - PPO

## 2019-03-04 ENCOUNTER — Ambulatory Visit (HOSPITAL_COMMUNITY): Payer: Federal, State, Local not specified - PPO

## 2019-03-06 ENCOUNTER — Encounter (HOSPITAL_COMMUNITY): Payer: Self-pay | Admitting: *Deleted

## 2019-03-06 ENCOUNTER — Ambulatory Visit (HOSPITAL_COMMUNITY): Payer: Federal, State, Local not specified - PPO

## 2019-03-06 ENCOUNTER — Encounter: Payer: Self-pay | Admitting: Physician Assistant

## 2019-03-06 ENCOUNTER — Encounter (HOSPITAL_COMMUNITY): Payer: Federal, State, Local not specified - PPO

## 2019-03-06 DIAGNOSIS — Z951 Presence of aortocoronary bypass graft: Secondary | ICD-10-CM

## 2019-03-09 ENCOUNTER — Encounter (HOSPITAL_COMMUNITY): Payer: Federal, State, Local not specified - PPO

## 2019-03-09 ENCOUNTER — Ambulatory Visit (HOSPITAL_COMMUNITY): Payer: Federal, State, Local not specified - PPO

## 2019-03-10 ENCOUNTER — Telehealth (HOSPITAL_COMMUNITY): Payer: Self-pay | Admitting: *Deleted

## 2019-03-10 ENCOUNTER — Encounter (HOSPITAL_COMMUNITY): Payer: Self-pay | Admitting: *Deleted

## 2019-03-10 DIAGNOSIS — Z951 Presence of aortocoronary bypass graft: Secondary | ICD-10-CM

## 2019-03-10 NOTE — Telephone Encounter (Signed)
Called to notify patient that the cardiac and pulmonary rehabilitation department will be closed for 4 weeks due to COVID-19 restrictions. Message left on voicemail. Latrell Reitan M Iyanah Demont, MS, ACSM CEP  

## 2019-03-10 NOTE — Progress Notes (Signed)
Cardiac Individual Treatment Plan  Patient Details  Name: Adriana [name] MRN: [id number] Date of Birth: 1946-04-18 Referring Provider:     CARDI[facility] REHAB [facility]  Referring Provider  Dr. 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  acetaminophen (TYLENOL) 500 MG tablet, Take 1 tablet (500 mg total) by mouth every 6 (six) hours as needed for moderate pain (or Fever >/= 101). (Patient not taking: Reported on 01/28/2019), Disp: 20 tablet, Rfl: 0 .  atorvastatin (LIPITOR) 20 MG tablet, Take 1 tablet (20 mg total) by mouth daily at 6 PM., Disp: 90 tablet, Rfl: 3 .  buPROPion (WELLBUTRIN XL) 300 MG 24 hr tablet, Take 300 mg by mouth daily. , Disp: , Rfl: 2 .  Cholecalciferol (VITAMIN D) 2000 units CAPS, Take 2,000 Units by mouth daily. , Disp: , Rfl:  .  clobetasol cream (TEMOVATE) 0.05 %, Apply 1 application topically daily as needed (for psoriasis). , Disp: , Rfl:  .  Continuous Blood Gluc Sensor (FREESTYLE LIBRE 14 DAY SENSOR) MISC, 1 each by Does not apply route every 14 (fourteen) days. Change every 2 weeks (Patient not taking: Reported on 02/25/2019), Disp: 2 each, Rfl: 11 .  Insulin Glargine (BASAGLAR KWIKPEN) 100 UNIT/ML SOPN, Inject 0.15 mLs (15 Units total) into the skin at bedtime., Disp: 4.5 mL, Rfl: 0 .  insulin lispro (HUMALOG) 100 UNIT/ML injection, Inject 0.06 mLs (6 Units total) into the skin 3 (three) times daily before meals., Disp: 10 mL, Rfl: 11 .  lisinopril (PRINIVIL,ZESTRIL) 5 MG tablet, Take 5 mg by mouth daily., Disp: , Rfl:  .  metFORMIN (GLUCOPHAGE) 1000 MG tablet, Take 1,000 mg by mouth 2 (two) times daily with a meal., Disp: , Rfl:  .  metoprolol tartrate  (LOPRESSOR) 25 MG tablet, Take 1 tablet (25 mg total) by mouth 2 (two) times daily., Disp: 60 tablet, Rfl: 1 .  potassium citrate (UROCIT-K) 10 MEQ (1080 MG) SR tablet, Take 10 mEq by mouth 2 (two) times daily. , Disp: , Rfl:  .  promethazine (PHENERGAN) 25 MG tablet, Take 12.5 mg by mouth every evening. , Disp: , Rfl: 1 .  warfarin (COUMADIN) 5 MG tablet, Take by mouth See admin instructions. Take 5 mg every Monday and Friday; 2.5 mg all other days of the week, Disp: , Rfl:  .  white petrolatum (VASELINE) GEL, Apply 1 application topically daily as needed (for psoriasis)., Disp: , Rfl:   Past Medical History: Past Medical History:  Diagnosis Date  . Abdominal pain 05/01/2018  . AKI (acute kidney injury) (HCC) 05/02/2018  . Cellulitis of right leg 05/01/2018  . Coronary artery disease   . Depression    denies  . DVT (deep venous thrombosis) (HCC)   . Dyspnea    "anytime"  . Essential hypertension   . Factor V Leiden mutation (HCC)   . GERD (gastroesophageal reflux disease)   . Heart burn   . History of kidney stones   . HLD (hyperlipidemia)   . Myocardial infarction (HCC)   . Pneumonia    x2  . PONV (postoperative nausea and vomiting)    blood pressure up/down, confusion  . Poorly controlled type 2  diabetes mellitus (HCC)   . 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 Sepsis (HCC) 05/01/2018  . Sleep apnea   . 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 Last attempt to quit: 2004  . Years since quitting: 16.2  Smokeless Tobacco Never Used    Labs: Recent Hydrographic surveyor    Labs for ITP Cardiac and Pulmonary Rehab Latest Ref Rng & Units 11/17/2018 11/17/2018 11/17/2018 11/17/2018 11/18/2018   Cholestrol 0 - 200 mg/dL - - - - -   LDLCALC 0 - 99 mg/dL - - - - -   HDL >40 mg/dL - - - - -   Trlycerides <150 mg/dL - - - - -    Hemoglobin A1c 4.8 - 5.6 % - - - - -   PHART 7.350 - 7.450 7.377 7.271(L) - 7.309(L) -   PCO2ART 32.0 - 48.0 mmHg 36.7 47.5 - 48.1(H) -   HCO3 20.0 - 28.0 mmol/L 21.8 21.9 - 24.2 -   TCO2 22 - 32 mmol/L ACIDBASEDEF 0.0 - 2.0 mmol/L 3.0(H) 5.0(H) - 2.0 -   O2SAT % 94.0 98.0 - 98.0 -      Capillary Blood Glucose: Lab Results  Component Value Date   GLUCAP 187 (H) 02/27/2019   GLUCAP 256 (H) 02/27/2019   GLUCAP 183 (H) 02/25/2019   GLUCAP 262 (H) 02/25/2019   GLUCAP 155 (H) 11/24/2018     Exercise Target Goals: Exercise Program Goal: Individual exercise prescription set using results from initial 6 min walk test and THRR while considering  patient's activity barriers and safety.   Exercise Prescription Goal: Initial exercise prescription builds to 30-45 minutes a day of aerobic activity, 2-3 days per week.  Home exercise guidelines will be given to patient during program as part of exercise prescription that the participant will acknowledge.  Activity Barriers & Risk Stratification: Activity Barriers & Cardiac Risk Stratification - 02/05/19 1439      Activity Barriers & Cardiac Risk Stratification   Activity Barriers  Muscular Weakness;Deconditioning;Shortness of Breath    Cardiac Risk Stratification  High       6 Minute Walk: 6 Minute Walk    Row Name 02/05/19 1438         6 Minute Walk   Phase  Initial     Distance  1756 feet     Walk Time  6 minutes     # of Rest Breaks  0     MPH  3.3     METS  3.7     RPE  12     Perceived Dyspnea   1     VO2 Peak  13.02     Symptoms  Yes (comment)     Comments  Calf pain 4/10, Mild SOB     Resting HR  70 bpm     Resting BP  108/80     Resting Oxygen Saturation   97 %     Exercise Oxygen Saturation  during 6 min walk  97 %     Max Ex. HR  104 bpm     Max Ex. BP  118/64     2 Minute Post BP  104/74        Oxygen Initial Assessment:   Oxygen Re-Evaluation:   Oxygen Discharge (Final Oxygen  Re-Evaluation):  Initial Exercise Prescription: Initial Exercise Prescription - 02/05/19 1400      Date of Initial Exercise RX and Referring Provider   Date  02/05/19    Referring Provider  Dr. Excell Seltzer    Expected Discharge Date  05/15/19      Recumbant Bike   Level  1.5    Watts  35    Minutes  10    METs  3.69      NuStep   Level  2    SPM  75    Minutes  10    METs  3.5      Track   Laps  13    Minutes  10    METs  3.3      Prescription Details   Frequency (times per week)  2    Duration  Progress to 30 minutes of continuous aerobic without signs/symptoms of physical distress      Intensity   THRR 40-80% of Max Heartrate  60-119    Ratings of Perceived Exertion  11-13      Progression   Progression  Continue to progress workloads to maintain intensity without signs/symptoms of physical distress.      Resistance Training   Training Prescription  Yes    Weight  2 lbs.     Reps  10-15       Perform Capillary Blood Glucose checks as needed.  Exercise Prescription Changes: Exercise Prescription Changes    Row Name 02/25/19 1506 02/27/19 1458           Response to Exercise   Blood Pressure (Admit)  102/62  104/52      Blood Pressure (Exercise)  122/80  112/72      Blood Pressure (Exit)  102/72  102/70      Heart Rate (Admit)  73 bpm  83 bpm      Heart Rate (Exercise)  94 bpm  94 bpm      Heart Rate (Exit)  72 bpm  83 bpm      Rating of Perceived Exertion (Exercise)  12  12      Symptoms  none  none      Duration  Progress to 30 minutes of  aerobic without signs/symptoms of physical distress  Progress to 30 minutes of  aerobic without signs/symptoms of physical distress      Intensity  THRR unchanged  THRR unchanged        Progression   Progression  Continue to progress workloads to maintain intensity without signs/symptoms of physical distress.  Continue to progress workloads to maintain intensity without signs/symptoms of physical distress.       Average METs  2.4  2.2        Resistance Training   Training Prescription  No Relaxation day, no weights.  Yes      Weight  -  2lbs      Reps  -  10-15      Time  -  10 Minutes        Interval Training   Interval Training  No  No        Recumbant Bike   Level  1.5  1.5      Minutes  10  10      METs  -  1.9        NuStep   Level  2  2      SPM  75  75      Minutes  10  10      METs  2.2  2.4        Track   Laps  9  7      Minutes  10  10      METs  2.57  2.22         Exercise Comments: Exercise Comments    Row Name 02/25/19 1557           Exercise Comments  Patient tolerated low intensity exercise well without symptoms.          Exercise Goals and Review: Exercise Goals    Row Name 02/05/19 1352             Exercise Goals   Increase Physical Activity  Yes       Intervention  Provide advice, education, support and counseling about physical activity/exercise needs.;Develop an individualized exercise prescription for aerobic and resistive training based on initial evaluation findings, risk stratification, comorbidities and participant's personal goals.       Expected Outcomes  Short Term: Attend rehab on a regular basis to increase amount of physical activity.;Long Term: Add in home exercise to make exercise part of routine and to increase amount of physical activity.;Long Term: Exercising regularly at least 3-5 days a week.       Increase Strength and Stamina  Yes       Intervention  Provide advice, education, support and counseling about physical activity/exercise needs.;Develop an individualized exercise prescription for aerobic and resistive training based on initial evaluation findings, risk stratification, comorbidities and participant's personal goals.       Expected Outcomes  Short Term: Increase workloads from initial exercise prescription for resistance, speed, and METs.;Short Term: Perform resistance training exercises routinely during rehab and add in  resistance training at home;Long Term: Improve cardiorespiratory fitness, muscular endurance and strength as measured by increased METs and functional capacity ( )       Able to understand and use rate of perceived exertion (RPE) scale  Yes       Intervention  Provide education and explanation on how to use RPE scale       Expected Outcomes  Short Term: Able to use RPE daily in rehab to express subjective intensity level;Long Term:  Able to use RPE to guide intensity level when exercising independently       Knowledge and understanding of Target Heart Rate Range (THRR)  Yes       Intervention  Provide education and explanation of THRR including how the numbers were predicted and where they are located for reference       Expected Outcomes  Short Term: Able to state/look up THRR;Short Term: Able to use daily as guideline for intensity in rehab;Long Term: Able to use THRR to govern intensity when exercising independently       Able to check pulse independently  Yes       Intervention  Provide education and demonstration on how to check pulse in carotid and radial arteries.;Review the importance of being able to check your own pulse for safety during independent exercise       Expected Outcomes  Short Term: Able to explain why pulse checking is important during independent exercise;Long Term: Able to check pulse independently and accurately       Understanding of Exercise Prescription  Yes       Intervention  Provide education, explanation, and written materials on patient's individual exercise prescription       Expected Outcomes  Short Term: Able to explain program exercise prescription;Long Term: Able to explain home exercise prescription to exercise independently          Exercise Goals Re-Evaluation : Exercise Goals Re-Evaluation    Row Name 02/25/19 1557 03/04/19 1353           Exercise Goal Re-Evaluation   Exercise Goals Review  Increase Physical Activity;Able to understand and use  rate of perceived exertion (RPE) scale  -      Comments  Patient able to understand and use RPE scale appropriately.  Temporary department closure due to COVID-19.      Expected Outcomes  Increae workloads as tolerated to help improve cardiorespiratory fitness.  -         Discharge Exercise Prescription (Final Exercise Prescription Changes): Exercise Prescription Changes - 02/27/19 1458      Response to Exercise   Blood Pressure (Admit)  104/52    Blood Pressure (Exercise)  112/72    Blood Pressure (Exit)  102/70    Heart Rate (Admit)  83 bpm    Heart Rate (Exercise)  94 bpm    Heart Rate (Exit)  83 bpm    Rating of Perceived Exertion (Exercise)  12    Symptoms  none    Duration  Progress to 30 minutes of  aerobic without signs/symptoms of physical distress    Intensity  THRR unchanged      Progression   Progression  Continue to progress workloads to maintain intensity without signs/symptoms of physical distress.    Average METs  2.2      Resistance Training   Training Prescription  Yes    Weight  2lbs    Reps  10-15    Time  10 Minutes      Interval Training   Interval Training  No      Recumbant Bike   Level  1.5    Minutes  10    METs  1.9      NuStep   Level  2    SPM  75    Minutes  10    METs  2.4      Track   Laps  7    Minutes  10    METs  2.22       Nutrition:  Target Goals: Understanding of nutrition guidelines, daily intake of sodium 1500mg , cholesterol 200mg , calories 30% from fat and 7% or less from saturated fats, daily to have 5 or more servings of fruits and vegetables.  Biometrics: Pre Biometrics - 02/05/19 1443      Pre Biometrics   Height  5' 5.25" (1.657 m)    Weight  147 lb 4.3 oz (66.8 kg)    Waist Circumference  35 inches    Hip Circumference  36.5 inches    Waist to Hip Ratio  0.96 %    BMI (Calculated)  24.33    Triceps Skinfold  40 mm    % Body Fat  39.3 %    Grip Strength  23.5 kg    Flexibility  12 in    Single Leg  Stand  30 seconds        Nutrition Therapy Plan and Nutrition Goals: Nutrition Therapy & Goals - 02/05/19 1513      Nutrition Therapy   Diet  heart healthy, carb modified      Personal Nutrition Goals   Nutrition Goal  Pt to identify and limit food sources of saturated fat, trans fat,  refined carbohydrates and sodium    Personal Goal #2  Pt to build a healthy plate including vegetables, fruits, whole grains, and low-fat dairy products in a heart healthy meal plan.    Personal Goal #3  Pt to go easy on snack chips and crackers    Personal Goal #4  Pt able to name foods that affect blood glucose      Intervention Plan   Intervention  Prescribe, educate and counsel regarding individualized specific dietary modifications aiming towards targeted core components such as weight, hypertension, lipid management, diabetes, heart failure and other comorbidities.    Expected Outcomes  Short Term Goal: Understand basic principles of dietary content, such as calories, fat, sodium, cholesterol and nutrients.;Long Term Goal: Adherence to prescribed nutrition plan.       Nutrition Assessments: Nutrition Assessments - 02/05/19 1514      MEDFICTS Scores   Pre Score  38       Nutrition Goals Re-Evaluation: Nutrition Goals Re-Evaluation    Row Name 02/05/19 1513             Goals   Current Weight  147 lb 4.3 oz (66.8 kg)          Nutrition Goals Re-Evaluation: Nutrition Goals Re-Evaluation    Row Name 02/05/19 1513             Goals   Current Weight  147 lb 4.3 oz (66.8 kg)          Nutrition Goals Discharge (Final Nutrition Goals Re-Evaluation): Nutrition Goals Re-Evaluation - 02/05/19 1513      Goals   Current Weight  147 lb 4.3 oz (66.8 kg)       Psychosocial: Target Goals: Acknowledge presence or absence of significant depression and/or stress, maximize coping skills, provide positive support system. Participant is able to verbalize types and ability to use techniques  and skills needed for reducing stress and depression.  Initial Review & Psychosocial Screening: Initial Psych Review & Screening - 02/05/19 1340      Initial Review   Current issues with  None Identified      Family Dynamics   Good Support System?  Yes   Adriana [name] has her husband and children for support     Barriers   Psychosocial barriers to participate in program  There are no identifiable barriers or psychosocial needs.      Screening Interventions   Interventions  Encouraged to exercise       Quality of Life Scores: Quality of Life - 02/25/19 1442      Quality of Life   Select  Quality of Life      Quality of Life Scores   Health/Function Pre  19.4 %    Socioeconomic Pre  24.79 %    Psych/Spiritual Pre  24.5 %    Family Pre  28.8 %    GLOBAL Pre  22.94 %      Scores of 19 and below usually indicate a poorer quality of life in these areas.  A difference of  2-3 points is a clinically meaningful difference.  A difference of 2-3 points in the total score of the Quality of Life Index has been associated with significant improvement in overall quality of life, self-image, physical symptoms, and general health in studies assessing change in quality of life.  PHQ-9: Recent Review Flowsheet Data    Depression screen [facility] 2/9 02/25/2019 11/07/2018   Decreased Interest 0 0   Down, Depressed, Hopeless 0 0  PHQ - 2 Score 0 0     Interpretation of Total Score  Total Score Depression Severity:  1-4 = Minimal depression, 5-9 = Mild depression, 10-14 = Moderate depression, 15-19 = Moderately severe depression, 20-27 = Severe depression   Psychosocial Evaluation and Intervention:   Psychosocial Re-Evaluation: Psychosocial Re-Evaluation    Row Name 03/06/19 1139             Psychosocial Re-Evaluation   Current issues with  None Identified       Comments  Unable to assess as exercise is currently on hold          Psychosocial Discharge (Final Psychosocial  Re-Evaluation): Psychosocial Re-Evaluation - 03/06/19 1139      Psychosocial Re-Evaluation   Current issues with  None Identified    Comments  Unable to assess as exercise is currently on hold       Vocational Rehabilitation: Provide vocational rehab assistance to qualifying candidates.   Vocational Rehab Evaluation & Intervention: Vocational Rehab - 02/05/19 1511      Initial Vocational Rehab Evaluation & Intervention   Assessment shows need for Vocational Rehabilitation  No   Adriana [name] is retired and does not need vocational rehab at this time      Education: Education Goals: Education classes will be provided on a weekly basis, covering required topics. Participant will state understanding/return demonstration of topics presented.  Learning Barriers/Preferences: Learning Barriers/Preferences - 02/05/19 1353      Learning Barriers/Preferences   Learning Barriers  Sight    Learning Preferences  Skilled Demonstration;Verbal Instruction;Written Material       Education Topics: Count Your Pulse:  -Group instruction provided by verbal instruction, demonstration, patient participation and written materials to support subject.  Instructors address importance of being able to find your pulse and how to count your pulse when at home without a heart monitor.  Patients get hands on experience counting their pulse with staff help and individually.   Heart Attack, Angina, and Risk Factor Modification:  -Group instruction provided by verbal instruction, video, and written materials to support subject.  Instructors address signs and symptoms of angina and heart attacks.    Also discuss risk factors for heart disease and how to make changes to improve heart health risk factors.   Functional Fitness:  -Group instruction provided by verbal instruction, demonstration, patient participation, and written materials to support subject.  Instructors address safety measures for doing things around  the house.  Discuss how to get up and down off the floor, how to pick things up properly, how to safely get out of a chair without assistance, and balance training.   Meditation and Mindfulness:  -Group instruction provided by verbal instruction, patient participation, and written materials to support subject.  Instructor addresses importance of mindfulness and meditation practice to help reduce stress and improve awareness.  Instructor also leads participants through a meditation exercise.    Stretching for Flexibility and Mobility:  -Group instruction provided by verbal instruction, patient participation, and written materials to support subject.  Instructors lead participants through series of stretches that are designed to increase flexibility thus improving mobility.  These stretches are additional exercise for major muscle groups that are typically performed during regular warm up and cool down.   Hands Only CPR:  -Group verbal, video, and participation provides a basic overview of AHA guidelines for community CPR. Role-play of emergencies allow participants the opportunity to practice calling for help and chest compression technique with discussion of AED use.  Hypertension: -Group verbal and written instruction that provides a basic overview of hypertension including the most recent diagnostic guidelines, risk factor reduction with self-care instructions and medication management.   CARDIAC REHAB PHASE II EXERCISE from 02/27/2019 in [facility] CARDIAC REHAB  Date  02/27/19  Instruction Review Code  2- Demonstrated Understanding       Nutrition I class: Heart Healthy Eating:  -Group instruction provided by PowerPoint slides, verbal discussion, and written materials to support subject matter. The instructor gives an explanation and review of the Therapeutic Lifestyle Changes diet recommendations, which includes a discussion on lipid goals, dietary fat, sodium, fiber,  plant stanol/sterol esters, sugar, and the components of a well-balanced, healthy diet.   Nutrition II class: Lifestyle Skills:  -Group instruction provided by PowerPoint slides, verbal discussion, and written materials to support subject matter. The instructor gives an explanation and review of label reading, grocery shopping for heart health, heart healthy recipe modifications, and ways to make healthier choices when eating out.   Diabetes Question & Answer:  -Group instruction provided by PowerPoint slides, verbal discussion, and written materials to support subject matter. The instructor gives an explanation and review of diabetes co-morbidities, pre- and post-prandial blood glucose goals, pre-exercise blood glucose goals, signs, symptoms, and treatment of hypoglycemia and hyperglycemia, and foot care basics.   Diabetes Blitz:  -Group instruction provided by PowerPoint slides, verbal discussion, and written materials to support subject matter. The instructor gives an explanation and review of the physiology behind type 1 and type 2 diabetes, diabetes medications and rational behind using different medications, pre- and post-prandial blood glucose recommendations and Hemoglobin A1c goals, diabetes diet, and exercise including blood glucose guidelines for exercising safely.    Portion Distortion:  -Group instruction provided by PowerPoint slides, verbal discussion, written materials, and food models to support subject matter. The instructor gives an explanation of serving size versus portion size, changes in portions sizes over the last 20 years, and what consists of a serving from each food group.   Stress Management:  -Group instruction provided by verbal instruction, video, and written materials to support subject matter.  Instructors review role of stress in heart disease and how to cope with stress positively.     Exercising on Your Own:  -Group instruction provided by verbal  instruction, power point, and written materials to support subject.  Instructors discuss benefits of exercise, components of exercise, frequency and intensity of exercise, and end points for exercise.  Also discuss use of nitroglycerin and activating EMS.  Review options of places to exercise outside of rehab.  Review guidelines for sex with heart disease.   Cardiac Drugs I:  -Group instruction provided by verbal instruction and written materials to support subject.  Instructor reviews cardiac drug classes: antiplatelets, anticoagulants, beta blockers, and statins.  Instructor discusses reasons, side effects, and lifestyle considerations for each drug class.   Cardiac Drugs II:  -Group instruction provided by verbal instruction and written materials to support subject.  Instructor reviews cardiac drug classes: angiotensin converting enzyme inhibitors (ACE-I), angiotensin II receptor blockers (ARBs), nitrates, and calcium channel blockers.  Instructor discusses reasons, side effects, and lifestyle considerations for each drug class.   Anatomy and Physiology of the Circulatory System:  Group verbal and written instruction and models provide basic cardiac anatomy and physiology, with the coronary electrical and arterial systems. Review of: AMI, Angina, Valve disease, Heart Failure, Peripheral Artery Disease, Cardiac Arrhythmia, Pacemakers, and the ICD.   Other Education:  -Group or  individual verbal, written, or video instructions that support the educational goals of the cardiac rehab program.   Holiday Eating Survival Tips:  -Group instruction provided by PowerPoint slides, verbal discussion, and written materials to support subject matter. The instructor gives patients tips, tricks, and techniques to help them not only survive but enjoy the holidays despite the onslaught of food that accompanies the holidays.   Knowledge Questionnaire Score: Knowledge Questionnaire Score - 02/05/19 1432       Knowledge Questionnaire Score   Pre Score  19/24       Core Components/Risk Factors/Patient Goals at Admission: Personal Goals and Risk Factors at Admission - 02/05/19 1353      Core Components/Risk Factors/Patient Goals on Admission    Weight Management  Yes;Weight Loss    Intervention  Weight Management: Develop a combined nutrition and exercise program designed to reach desired caloric intake, while maintaining appropriate intake of nutrient and fiber, sodium and fats, and appropriate energy expenditure required for the weight goal.;Weight Management: Provide education and appropriate resources to help participant work on and attain dietary goals.;Weight Management/Obesity: Establish reasonable short term and long term weight goals.    Admit Weight  147 lb 4.3 oz (66.8 kg)    Goal Weight: Long Term  137 lb (62.1 kg)    Expected Outcomes  Short Term: Continue to assess and modify interventions until short term weight is achieved;Long Term: Adherence to nutrition and physical activity/exercise program aimed toward attainment of established weight goal;Weight Loss: Understanding of general recommendations for a balanced deficit meal plan, which promotes 1-2 lb weight loss per week and includes a negative energy balance of [id number] kcal/d;Understanding recommendations for meals to include 15-35% energy as protein, 25-35% energy from fat, 35-60% energy from carbohydrates, less than 200mg  of dietary cholesterol, 20-35 gm of total fiber daily;Understanding of distribution of calorie intake throughout the day with the consumption of 4-5 meals/snacks    Diabetes  Yes    Intervention  Provide education about signs/symptoms and action to take for hypo/hyperglycemia.;Provide education about proper nutrition, including hydration, and aerobic/resistive exercise prescription along with prescribed medications to achieve blood glucose in normal ranges: Fasting glucose 65-99 mg/dL    Expected Outcomes  Short  Term: Participant verbalizes understanding of the signs/symptoms and immediate care of hyper/hypoglycemia, proper foot care and importance of medication, aerobic/resistive exercise and nutrition plan for blood glucose control.;Long Term: Attainment of HbA1C < 7%.    Hypertension  Yes    Intervention  Provide education on lifestyle modifcations including regular physical activity/exercise, weight management, moderate sodium restriction and increased consumption of fresh fruit, vegetables, and low fat dairy, alcohol moderation, and smoking cessation.;Monitor prescription use compliance.    Expected Outcomes  Short Term: Continued assessment and intervention until BP is < 140/12mm HG in hypertensive participants. < 130/1mm HG in hypertensive participants with diabetes, heart failure or chronic kidney disease.;Long Term: Maintenance of blood pressure at goal levels.    Lipids  Yes    Intervention  Provide education and support for participant on nutrition & aerobic/resistive exercise along with prescribed medications to achieve LDL 70mg , HDL >40mg .    Expected Outcomes  Short Term: Participant states understanding of desired cholesterol values and is compliant with medications prescribed. Participant is following exercise prescription and nutrition guidelines.;Long Term: Cholesterol controlled with medications as prescribed, with individualized exercise RX and with personalized nutrition plan. Value goals: LDL < 70mg , HDL > 40 mg.       Core Components/Risk Factors/Patient Goals Review:  Goals and Risk Factor Review    Row Name 03/06/19 1141             Core Components/Risk Factors/Patient Goals Review   Personal Goals Review  Weight Management/Obesity;Hypertension;Diabetes;Lipids       Review  Exercise currently on hold as department is closed per reccomended guidelines from the federal government to prevent the spread of COVID-19       Expected Outcomes  Adriana [name] will participate in cardiac rehab  for lifestyle modification and risk factor reduction once exercise resumes          Core Components/Risk Factors/Patient Goals at Discharge (Final Review):  Goals and Risk Factor Review - 03/06/19 1141      Core Components/Risk Factors/Patient Goals Review   Personal Goals Review  Weight Management/Obesity;Hypertension;Diabetes;Lipids    Review  Exercise currently on hold as department is closed per reccomended guidelines from the federal government to prevent the spread of COVID-19    Expected Outcomes  Adriana [name] will participate in cardiac rehab for lifestyle modification and risk factor reduction once exercise resumes       ITP Comments: ITP Comments    Row Name 02/05/19 1352 02/20/19 1205 03/06/19 1137       ITP Comments  Dr. Armanda Magic, Medical Director   30 Day ITP Review. Adriana [name]'s exercise has been on hold due to a foot ulcer. Awaiting physician clearance for patient to resume exercise.  30 Day ITP Review. Adriana [name] has attended 3 exercise sessions so far and did well..Exercise currently on hold as department is closed per reccomended guidelines from the federal government to prevent the spread of COVID-19        Comments: See ITP comments.Gladstone Lighter, RN,BSN 03/10/2019 3:23 PM

## 2019-03-11 ENCOUNTER — Encounter (HOSPITAL_COMMUNITY): Payer: Federal, State, Local not specified - PPO

## 2019-03-11 ENCOUNTER — Ambulatory Visit (HOSPITAL_COMMUNITY): Payer: Federal, State, Local not specified - PPO

## 2019-03-12 ENCOUNTER — Telehealth (HOSPITAL_COMMUNITY): Payer: Self-pay

## 2019-03-12 NOTE — Telephone Encounter (Signed)
Phone call to pt for nutrition education and counseling for heart healthy diet. Left voicemail with contact information.   Ross Marcus, MS, RD, LDN 03/12/2019 1:55 PM

## 2019-03-13 ENCOUNTER — Ambulatory Visit (HOSPITAL_COMMUNITY): Payer: Federal, State, Local not specified - PPO

## 2019-03-13 ENCOUNTER — Encounter (HOSPITAL_COMMUNITY): Payer: Federal, State, Local not specified - PPO

## 2019-03-15 ENCOUNTER — Telehealth: Payer: Self-pay | Admitting: Physician Assistant

## 2019-03-15 NOTE — Telephone Encounter (Signed)
L/m for patient to call back.  Please call on Mon 3/30 to see if she is able to change appt on 3/31 to telehealth (telephone or video).  Also, make sure she has instructions and consent.  Tereso Newcomer, PA-C    03/15/2019 3:47 PM

## 2019-03-16 ENCOUNTER — Ambulatory Visit (HOSPITAL_COMMUNITY): Payer: Federal, State, Local not specified - PPO

## 2019-03-16 ENCOUNTER — Encounter (HOSPITAL_COMMUNITY): Payer: Federal, State, Local not specified - PPO

## 2019-03-16 NOTE — Telephone Encounter (Signed)
New message   Patient has changed her appt to a virtual visit for 03/17/2019 please call with instructions for the patient on today.

## 2019-03-17 ENCOUNTER — Telehealth: Payer: Self-pay | Admitting: Physician Assistant

## 2019-03-17 ENCOUNTER — Other Ambulatory Visit: Payer: Self-pay

## 2019-03-17 ENCOUNTER — Telehealth (INDEPENDENT_AMBULATORY_CARE_PROVIDER_SITE_OTHER): Payer: Federal, State, Local not specified - PPO | Admitting: Physician Assistant

## 2019-03-17 ENCOUNTER — Encounter: Payer: Self-pay | Admitting: Physician Assistant

## 2019-03-17 ENCOUNTER — Telehealth: Payer: Self-pay | Admitting: *Deleted

## 2019-03-17 ENCOUNTER — Telehealth: Payer: Self-pay

## 2019-03-17 ENCOUNTER — Ambulatory Visit: Payer: Federal, State, Local not specified - PPO | Admitting: Physician Assistant

## 2019-03-17 VITALS — Ht 65.25 in | Wt 149.0 lb

## 2019-03-17 DIAGNOSIS — I9789 Other postprocedural complications and disorders of the circulatory system, not elsewhere classified: Secondary | ICD-10-CM | POA: Diagnosis not present

## 2019-03-17 DIAGNOSIS — I739 Peripheral vascular disease, unspecified: Secondary | ICD-10-CM

## 2019-03-17 DIAGNOSIS — E1165 Type 2 diabetes mellitus with hyperglycemia: Secondary | ICD-10-CM

## 2019-03-17 DIAGNOSIS — I10 Essential (primary) hypertension: Secondary | ICD-10-CM

## 2019-03-17 DIAGNOSIS — I4891 Unspecified atrial fibrillation: Secondary | ICD-10-CM

## 2019-03-17 DIAGNOSIS — E1159 Type 2 diabetes mellitus with other circulatory complications: Secondary | ICD-10-CM

## 2019-03-17 DIAGNOSIS — E785 Hyperlipidemia, unspecified: Secondary | ICD-10-CM

## 2019-03-17 DIAGNOSIS — I779 Disorder of arteries and arterioles, unspecified: Secondary | ICD-10-CM | POA: Insufficient documentation

## 2019-03-17 DIAGNOSIS — I2510 Atherosclerotic heart disease of native coronary artery without angina pectoris: Secondary | ICD-10-CM

## 2019-03-17 DIAGNOSIS — D6851 Activated protein C resistance: Secondary | ICD-10-CM

## 2019-03-17 MED ORDER — METOPROLOL TARTRATE 25 MG PO TABS: 12.5000 mg | ORAL_TABLET | Freq: Two times a day (BID) | ORAL | 3 refills | Status: DC

## 2019-03-17 NOTE — Telephone Encounter (Signed)
New Message:   Patient calling to have a appt over the phone patient states that the person  could not hear her. Patient has called 3 times. Please call patent back.

## 2019-03-17 NOTE — Telephone Encounter (Signed)
Pt gave consent to telephone visit. Do phrases are not available for me at this time.

## 2019-03-17 NOTE — Patient Instructions (Signed)
Medication Instructions:  Decrease Metoprolol Tartrate to 12.5 mg twice daily (take 1/2 of the 25 mg tablet)  If you need a refill on your cardiac medications before your next appointment, please call your pharmacy.   Lab work: None   If you have labs (blood work) drawn today and your tests are completely normal, you will receive your results only by: Marland Kitchen MyChart Message (if you have MyChart) OR . A paper copy in the mail If you have any lab test that is abnormal or we need to change your treatment, we will call you to review the results.  Testing/Procedures: None   Follow-Up: At [facility], you and your health needs are our priority.  As part of our continuing mission to provide you with exceptional heart care, we have created designated Provider Care Teams.  These Care Teams include your primary Cardiologist (physician) and Advanced Practice Providers (APPs -  Physician Assistants and Nurse Practitioners) who all work together to provide you with the care you need, when you need it. You will need a follow up appointment in:  6 months.  Please call our office 2 months in advance to schedule this appointment.  You may see [name], MD or Tereso Newcomer, PA-C  Any Other Special Instructions Will Be Listed Below (If Applicable).

## 2019-03-17 NOTE — Telephone Encounter (Signed)
LVM TO [facility] BACK ABOUT CONSENT TO VIDEO OR PHONE AND POSSIBLY MOVING APPOINTMENT UP A SOONER TIME.

## 2019-03-17 NOTE — Progress Notes (Signed)
Virtual Visit via Telephone Note    Evaluation Performed:  Follow-up visit  This visit type was conducted due to national recommendations for restrictions regarding the COVID-19 Pandemic (e.g. social distancing).  This format is felt to be most appropriate for this patient at this time.  All issues noted in this document were discussed and addressed.  No physical exam was performed (except for noted visual exam findings with Video Visits).  Please refer to the patient's chart (MyChart message for video visits and phone note for telephone visits) for the patient's consent to telehealth for [facility].  Date:  03/17/2019   ID:  Adriana [name], Adriana [name] 04-18-1946, MRN [id number]  Patient Location:  Home  Provider location:   Home  PCP:  [name], MD  Cardiologist:  [name], MD   Electrophysiologist:  None   Chief Complaint:   Follow-up on coronary artery disease  History of Present Illness:    Adriana [name] is a 73 y.o. female who presents via audio/video conferencing for a telehealth visit today.    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  She was evaluated by Dr. Excell Seltzer in October 2019 for shortness of breath.  Her nuclear stress test was high risk and cardiac catheterization demonstrated significant three-vessel CAD.  She underwent CABG in December 2019.  Postoperative course was complicated by atrial fibrillation which was controlled by amiodarone.  She was last seen in clinic in December 2019.  Since that time, she saw Dr. [name] in follow-up and her amiodarone was stopped.  Today, she is doing well.  She did have difficulty with decreased energy.  She still has some decreased energy, however this is overall improved.  She has not had chest discomfort,  shortness of breath, syncope, lower extremity swelling.  She has some transient paresthesias in her left hand which seem to be related to positional changes.  She notes that she increased her insulin glargine on her own back to 24 units/day.  The patient does not have symptoms concerning for COVID-19 infection (fever, chills, cough, or new shortness of breath).    Prior CV studies:   The following studies were reviewed today:  Pre-CABG Dopplers 11/12/2018 R 40-59; L 1-39 ABIs: R 1.02; L 0.90  Cardiac catheterization 11/04/2018 LM mid 30 LAD ostial 30, proximal 80 (right to left collaterals); D1 80 ostial OM1 superior branch 50, larger inferior branch diffuse 75 RCA proximal 100-CTO, left to right collaterals EF 50-55, inferoapical hypokinesis  Myoview 10/29/2018 EF 40, prior myocardial infarction with peri-infarct ischemia in the anteroseptal, inferoseptal and apical regions; high risk  Echocardiogram 10/29/2018 Moderate concentric LVH, EF 50-55, grade 1 diastolic dysfunction, anteroseptal/inferoseptal/inferior/apical septal and apical hypokinesis, trivial MR, normal RV SF, possible small fixed mass on right side of interatrial septum, trivial TR  Past Medical History:  Diagnosis Date  . AKI (acute kidney injury) (HCC) 05/02/2018  . Carotid artery disease (HCC)    hx of bilat CEA // Carotid US 12/19: R 40-59; L 1-39  . Cellulitis of right leg 05/01/2018  . Coronary artery disease    Myoview 10/19 High Risk // LHC 11/19 w/ 3 v CAD // Echo 11/19: EF 50-55, gr 1 DD, ant-sept, inf-sept, apical HK // s/p CABG in 11/2018  . Depression    denies  . DVT (deep venous thrombosis) (HCC)  in setting of Factor V Leiden mutation  . Essential hypertension   . Factor V Leiden mutation (HCC)    Chronic Warfarin managed by PCP // hx of recurrent DVT; prior CVA  . GERD (gastroesophageal reflux disease)   . History of kidney stones   . History of sepsis 05/01/2018  . History of stroke 2004    right side weakness-resolved  . HLD (hyperlipidemia)   . Myocardial infarction (HCC)   . Pneumonia    x2  . PONV (postoperative nausea and vomiting)    blood pressure up/down, confusion  . Poorly controlled type 2 diabetes mellitus (HCC)   . Sleep apnea    Past Surgical History:  Procedure Laterality Date  . ABDOMINAL HYSTERECTOMY    . BACK SURGERY    . CARDIAC CATHETERIZATION    . Carotid artery endarterectomy     right and left side with stents  . CATARACT EXTRACTION Bilateral   . CHOLECYSTECTOMY    . CORONARY ARTERY BYPASS GRAFT N/A 11/17/2018   Procedure: CORONARY ARTERY BYPASS GRAFTING (CABG), ON PUMP, TIMES FIVE, USING LEFT INTERNAL MAMMARY ARTERY AND ENDOSCOPICALLY HARVESTED LEFT GREATER SAPHENOUS VEIN;  Surgeon: Alleen Borne, MD;  Location: [facility] OR;  Service: Open Heart Surgery;  Laterality: N/A;  . LEFT HEART CATH AND CORONARY ANGIOGRAPHY N/A 11/04/2018   Procedure: LEFT HEART CATH AND CORONARY ANGIOGRAPHY;  Surgeon: [name], MD;  [facility] INVASIVE CV LAB;  Service: Cardiovascular;  Laterality: N/A;  . LITHOTRIPSY     x3  . TEE WITHOUT CARDIOVERSION N/A 11/17/2018   Procedure: TRANSESOPHAGEAL ECHOCARDIOGRAM (TEE);  Surgeon: Alleen Borne, MD;  Location: [facility] OR;  Service: Open Heart Surgery;  Laterality: N/A;  . TONSILLECTOMY    . trigger fingers left and right Bilateral   . TUBAL LIGATION       Current Meds  Medication Sig  . atorvastatin (LIPITOR) 20 MG tablet Take 1 tablet (20 mg total) by mouth daily at 6 PM.  . buPROPion (WELLBUTRIN XL) 300 MG 24 hr tablet Take 300 mg by mouth daily.   . Cholecalciferol (VITAMIN D) 2000 units CAPS Take 2,000 Units by mouth daily.   . clobetasol cream (TEMOVATE) 0.05 % Apply 1 application topically daily as needed (for psoriasis).   . Continuous Blood Gluc Sensor (FREESTYLE LIBRE 14 DAY SENSOR) MISC 1 each by Does not apply route every 14 (fourteen) days. Change every 2 weeks  . cyanocobalamin 2000 MCG tablet Take 2,000  mcg by mouth daily.  . Insulin Glargine (BASAGLAR KWIKPEN) 100 UNIT/ML SOPN Inject 0.15 mLs (15 Units total) into the skin at bedtime.  . insulin lispro (HUMALOG) 100 UNIT/ML injection Inject 0.06 mLs (6 Units total) into the skin 3 (three) times daily before meals.  Marland Kitchen lisinopril (PRINIVIL,ZESTRIL) 5 MG tablet Take 5 mg by mouth daily.  . metFORMIN (GLUCOPHAGE) 1000 MG tablet Take 1,000 mg by mouth 2 (two) times daily with a meal.  . potassium citrate (UROCIT-K) 10 MEQ (1080 MG) SR tablet Take 10 mEq by mouth 2 (two) times daily.   . promethazine (PHENERGAN) 25 MG tablet Take 12.5 mg by mouth every evening.   . warfarin (COUMADIN) 5 MG tablet Take by mouth See admin instructions. Take 5 mg every Monday and Friday; 2.5 mg all other days of the week  . white petrolatum (VASELINE) GEL Apply 1 application topically daily as needed (for psoriasis).  . [DISCONTINUED] metoprolol tartrate (LOPRESSOR) 25 MG tablet Take 1 tablet (25 mg total) by mouth 2 (  two) times daily.     Allergies:   Anesthesia s-i-60 and Aspirin   Social History   Tobacco Use  . Smoking status: Former Smoker    Last attempt to quit: 2004    Years since quitting: 16.2  . Smokeless tobacco: Never Used  Substance Use Topics  . Alcohol use: Not Currently  . Drug use: Not Currently     Family Hx: The patient's family history includes COPD in her brother; Cataracts in her maternal grandmother; Diabetes in her brother and maternal grandmother; Lung cancer in her father; Stroke in her mother. There is no history of Amblyopia, Blindness, Glaucoma, Hypertension, Macular degeneration, Retinal detachment, Strabismus, or Retinitis pigmentosa.  ROS:   Please see the history of present illness.    All other systems reviewed and are negative.   Labs/Other Tests and Data Reviewed:    Recent Labs: 11/12/2018: ALT 24 11/18/2018: Magnesium 2.4 11/23/2018: BUN 13; Creatinine, Ser 0.77; Hemoglobin 10.2; Platelets 309; Potassium 3.7;  Sodium 140   Recent Lipid Panel Lab Results  Component Value Date/Time   CHOL 83 05/05/2018 03:16 AM   TRIG 93 05/05/2018 03:16 AM   HDL 31 (L) 05/05/2018 03:16 AM   CHOLHDL 2.7 05/05/2018 03:16 AM   LDLCALC 33 05/05/2018 03:16 AM   From KPN Tool    Wt Readings from Last 3 Encounters:  03/17/19 149 lb (67.6 kg)  02/05/19 147 lb 4.3 oz (66.8 kg)  01/14/19 142 lb (64.4 kg)     Objective:    Vital Signs:  Ht 5' 5.25" (1.657 m)   Wt 149 lb (67.6 kg)   BMI 24.61 kg/m    From KPN Tool:   ASSESSMENT & PLAN:    Coronary artery disease involving native coronary artery of native heart without angina pectoris Hx of CABG in 11/2018.  She is doing well without anginal symptoms.  She is not on aspirin as she is on long-term warfarin.  Continue beta-blocker, statin.  Bilateral carotid artery disease, unspecified type (HCC) Hx of prior bilateral CEA.  RICA with 40-59% stenosis in 10/2018.  She will need a follow up Carotid US 10/2019.    Postoperative atrial fibrillation (HCC) She is now off Amiodarone.    Poorly controlled type 2 diabetes mellitus with circulatory disorder (HCC) Managed by primary care.  Empagliflozin could be considered given the results of the EMPA-REG OUTCOME trial.  As noted, she did increase her insulin glargine on her own back to 24 units/day secondary to hyperglycemia.  Factor V Leiden mutation (HCC) Warfarin managed by primary care.  She inquired whether or not she could check her INR at home to prevent exposure to SARS-CoV-2.  I asked her to review this with Dr. Evlyn Kanner first.  The other option she could do is change to a DOAC for the short term.  But, she is worried about side effects.   Essential hypertension BP at her PCP's office recently was on the low side. She is not symptomatic.  She has been fatigued.  This may be due to beta-blocker therapy.  She feels somewhat better since coming off of Amiodarone.    -Decrease Metoprolol to 12.5 mg twice daily    Hyperlipidemia, unspecified hyperlipidemia type LDL optimal on most recent lab work.  Continue current Rx.       COVID-19 Education: The signs and symptoms of COVID-19 were discussed with the patient and how to seek care for testing (follow up with PCP or arrange E-visit).  The importance of social  distancing was discussed today.  Patient Risk:   After full review of this patient's clinical status, I feel that they are at least moderate risk at this time.  Time:   Today, I have spent 26 minutes with the patient with telehealth technology discussing the above problems.     Medication Adjustments/Labs and Tests Ordered: Current medicines are reviewed at length with the patient today.  Concerns regarding medicines are outlined above.  Tests Ordered: No orders of the defined types were placed in this encounter.  Medication Changes: Meds ordered this encounter  Medications  . metoprolol tartrate (LOPRESSOR) 25 MG tablet    Sig: Take 0.5 tablets (12.5 mg total) by mouth 2 (two) times daily.    Dispense:  90 tablet    Refill:  3    Dose decrease    Order Specific Question:   Supervising Provider    Answer:   Lewayne Bunting [[id number]]    Disposition:  Follow up in 6 month(s)  Signed, Tereso Newcomer, PA-C  03/17/2019 1:18 PM    [facility]

## 2019-03-17 NOTE — Telephone Encounter (Signed)
TELEPHONE CALL NOTE  Adriana [name] has been deemed a candidate for a follow-up tele-health visit to limit community exposure during the Covid-19 pandemic. I spoke with the patient via phone to ensure availability of phone/video source, confirm preferred email & phone number, and discuss instructions and expectations.  I reminded Adriana [name] to be prepared with any vital sign and/or heart rhythm information that could potentially be obtained via home monitoring, at the time of her visit. I reminded Adriana [name] to expect a phone call at the time of her visit if her visit.  Did the patient verbally acknowledge consent to treatment? YES   Oleta Mouse, CMA 03/17/2019 11:34 AM    PT HAS RESCHEDULE APPT TIME FROM 3:45 TO 12:45

## 2019-03-18 ENCOUNTER — Ambulatory Visit (HOSPITAL_COMMUNITY): Payer: Federal, State, Local not specified - PPO

## 2019-03-18 ENCOUNTER — Encounter (HOSPITAL_COMMUNITY): Payer: Federal, State, Local not specified - PPO

## 2019-03-20 ENCOUNTER — Ambulatory Visit (HOSPITAL_COMMUNITY): Payer: Federal, State, Local not specified - PPO

## 2019-03-20 ENCOUNTER — Encounter (HOSPITAL_COMMUNITY): Payer: Federal, State, Local not specified - PPO

## 2019-03-23 ENCOUNTER — Encounter (HOSPITAL_COMMUNITY): Payer: Federal, State, Local not specified - PPO

## 2019-03-23 ENCOUNTER — Ambulatory Visit (HOSPITAL_COMMUNITY): Payer: Federal, State, Local not specified - PPO

## 2019-03-25 ENCOUNTER — Ambulatory Visit (HOSPITAL_COMMUNITY): Payer: Federal, State, Local not specified - PPO

## 2019-03-25 ENCOUNTER — Encounter (HOSPITAL_COMMUNITY): Payer: Federal, State, Local not specified - PPO

## 2019-03-27 ENCOUNTER — Telehealth (HOSPITAL_COMMUNITY): Payer: Self-pay | Admitting: *Deleted

## 2019-03-27 ENCOUNTER — Encounter (HOSPITAL_COMMUNITY): Payer: Federal, State, Local not specified - PPO

## 2019-03-27 ENCOUNTER — Ambulatory Visit (HOSPITAL_COMMUNITY): Payer: Federal, State, Local not specified - PPO

## 2019-03-27 NOTE — Telephone Encounter (Signed)
Called to notify patient that the cardiac and pulmonary rehabilitation department remains closed at this time due to COVID-19 restrictions. Message left on voicemail.   Artist Pais, MS, ACSM CEP 03/27/2019 1329

## 2019-03-30 ENCOUNTER — Encounter (HOSPITAL_COMMUNITY): Payer: Federal, State, Local not specified - PPO

## 2019-03-30 ENCOUNTER — Ambulatory Visit (HOSPITAL_COMMUNITY): Payer: Federal, State, Local not specified - PPO

## 2019-04-01 ENCOUNTER — Ambulatory Visit (HOSPITAL_COMMUNITY): Payer: Federal, State, Local not specified - PPO

## 2019-04-01 ENCOUNTER — Encounter (HOSPITAL_COMMUNITY): Payer: Federal, State, Local not specified - PPO

## 2019-04-03 ENCOUNTER — Ambulatory Visit (HOSPITAL_COMMUNITY): Payer: Federal, State, Local not specified - PPO

## 2019-04-03 ENCOUNTER — Encounter (HOSPITAL_COMMUNITY): Payer: Federal, State, Local not specified - PPO

## 2019-04-06 ENCOUNTER — Encounter (HOSPITAL_COMMUNITY): Payer: Federal, State, Local not specified - PPO

## 2019-04-06 ENCOUNTER — Ambulatory Visit (HOSPITAL_COMMUNITY): Payer: Federal, State, Local not specified - PPO

## 2019-04-08 ENCOUNTER — Ambulatory Visit (HOSPITAL_COMMUNITY): Payer: Federal, State, Local not specified - PPO

## 2019-04-08 ENCOUNTER — Encounter (HOSPITAL_COMMUNITY): Payer: Federal, State, Local not specified - PPO

## 2019-04-10 ENCOUNTER — Ambulatory Visit (HOSPITAL_COMMUNITY): Payer: Federal, State, Local not specified - PPO

## 2019-04-10 ENCOUNTER — Encounter (HOSPITAL_COMMUNITY): Payer: Federal, State, Local not specified - PPO

## 2019-04-13 ENCOUNTER — Encounter (HOSPITAL_COMMUNITY): Payer: Federal, State, Local not specified - PPO

## 2019-04-13 ENCOUNTER — Ambulatory Visit (HOSPITAL_COMMUNITY): Payer: Federal, State, Local not specified - PPO

## 2019-04-15 ENCOUNTER — Ambulatory Visit (HOSPITAL_COMMUNITY): Payer: Federal, State, Local not specified - PPO

## 2019-04-15 ENCOUNTER — Encounter (HOSPITAL_COMMUNITY): Payer: Federal, State, Local not specified - PPO

## 2019-04-17 ENCOUNTER — Encounter (HOSPITAL_COMMUNITY): Payer: Federal, State, Local not specified - PPO

## 2019-04-17 ENCOUNTER — Ambulatory Visit (HOSPITAL_COMMUNITY): Payer: Federal, State, Local not specified - PPO

## 2019-04-17 DIAGNOSIS — E539 Vitamin B deficiency, unspecified: Secondary | ICD-10-CM | POA: Insufficient documentation

## 2019-04-17 DIAGNOSIS — F419 Anxiety disorder, unspecified: Secondary | ICD-10-CM | POA: Insufficient documentation

## 2019-04-17 DIAGNOSIS — I700 Atherosclerosis of aorta: Secondary | ICD-10-CM | POA: Insufficient documentation

## 2019-04-20 ENCOUNTER — Encounter (HOSPITAL_COMMUNITY): Payer: Federal, State, Local not specified - PPO

## 2019-04-20 ENCOUNTER — Ambulatory Visit (HOSPITAL_COMMUNITY): Payer: Federal, State, Local not specified - PPO

## 2019-04-21 ENCOUNTER — Encounter (INDEPENDENT_AMBULATORY_CARE_PROVIDER_SITE_OTHER): Payer: Medicare Other | Admitting: Ophthalmology

## 2019-04-22 ENCOUNTER — Encounter (HOSPITAL_COMMUNITY): Payer: Federal, State, Local not specified - PPO

## 2019-04-22 ENCOUNTER — Ambulatory Visit (HOSPITAL_COMMUNITY): Payer: Federal, State, Local not specified - PPO

## 2019-04-24 ENCOUNTER — Encounter (HOSPITAL_COMMUNITY): Payer: Federal, State, Local not specified - PPO

## 2019-04-24 ENCOUNTER — Ambulatory Visit (HOSPITAL_COMMUNITY): Payer: Federal, State, Local not specified - PPO

## 2019-04-27 ENCOUNTER — Encounter (HOSPITAL_COMMUNITY): Payer: Federal, State, Local not specified - PPO

## 2019-04-27 ENCOUNTER — Ambulatory Visit (HOSPITAL_COMMUNITY): Payer: Federal, State, Local not specified - PPO

## 2019-04-29 ENCOUNTER — Ambulatory Visit (HOSPITAL_COMMUNITY): Payer: Federal, State, Local not specified - PPO

## 2019-04-29 ENCOUNTER — Encounter (HOSPITAL_COMMUNITY): Payer: Federal, State, Local not specified - PPO

## 2019-05-01 ENCOUNTER — Encounter (HOSPITAL_COMMUNITY): Payer: Federal, State, Local not specified - PPO

## 2019-05-01 ENCOUNTER — Ambulatory Visit (HOSPITAL_COMMUNITY): Payer: Federal, State, Local not specified - PPO

## 2019-05-04 ENCOUNTER — Ambulatory Visit (HOSPITAL_COMMUNITY): Payer: Federal, State, Local not specified - PPO

## 2019-05-04 ENCOUNTER — Encounter (HOSPITAL_COMMUNITY): Payer: Federal, State, Local not specified - PPO

## 2019-05-06 ENCOUNTER — Encounter (HOSPITAL_COMMUNITY): Payer: Federal, State, Local not specified - PPO

## 2019-05-06 ENCOUNTER — Ambulatory Visit (HOSPITAL_COMMUNITY): Payer: Federal, State, Local not specified - PPO

## 2019-05-08 ENCOUNTER — Ambulatory Visit (HOSPITAL_COMMUNITY): Payer: Federal, State, Local not specified - PPO

## 2019-05-08 ENCOUNTER — Encounter (HOSPITAL_COMMUNITY): Payer: Federal, State, Local not specified - PPO

## 2019-05-13 ENCOUNTER — Encounter (HOSPITAL_COMMUNITY): Payer: Federal, State, Local not specified - PPO

## 2019-05-13 ENCOUNTER — Telehealth (HOSPITAL_COMMUNITY): Payer: Self-pay | Admitting: *Deleted

## 2019-05-13 ENCOUNTER — Ambulatory Visit (HOSPITAL_COMMUNITY): Payer: Federal, State, Local not specified - PPO

## 2019-05-22 ENCOUNTER — Telehealth: Payer: Self-pay | Admitting: Cardiovascular Disease

## 2019-05-22 DIAGNOSIS — E875 Hyperkalemia: Secondary | ICD-10-CM | POA: Insufficient documentation

## 2019-05-22 NOTE — Telephone Encounter (Signed)
New Message   Pt c/o medication issue:  1. Name of Medication: chlorthalidone 25mg   2. How are you currently taking this medication (dosage and times per day)?   3. Are you having a reaction (difficulty breathing--STAT)?   4. What is your medication issue? Patient is calling to because she was told at one time to stop this medication. She said it was used to prevent kidney stones. She is wanting to know should she resume taking this medication now.

## 2019-05-22 NOTE — Telephone Encounter (Signed)
The patient is calling because she has a hx of kidney stones and was taking Chlorthalidone 25 mg in the past, but it was stopped due to conflict with her new heart medications after heart surgery.  She is wondering if she could restart to prevent stones.  Please advise, thank you.

## 2019-05-24 NOTE — Telephone Encounter (Signed)
When last seen, her blood pressure was running low.  Is she able to check her blood pressure?  If so, have her check her BP daily for 3-5 days and call us with the readings.   Richardson Dopp, PA-C    05/24/2019 8:28 PM

## 2019-05-25 NOTE — Telephone Encounter (Signed)
Left message with Scott's advice and to call, if questions.

## 2019-05-29 NOTE — Telephone Encounter (Signed)
The patient called with some updated BP readings.  Her major question is why she was taken off the Chlorthalidone 25 mg  and can she be put back on it.  She fears kidney stone reoccurrence.  Please advise, thank you

## 2019-05-29 NOTE — Telephone Encounter (Signed)
Patient called today to give her BP readings:  6/8 125/54 HR 72 6/9 154/76 HR 105 6/10 145/71 HR 77 6/11 122/63 HR 70 6/12 137/77 HR 70

## 2019-05-31 NOTE — Telephone Encounter (Signed)
Ok to resume chlorthalidone 25 mg once daily. It looks like she was on potassium citrate 10 mEq three times a day when she was also taking chlorthalidone.   Increase potassium citrate to 10 mEq three times a day. BMET in 2 weeks. Richardson Dopp, PA-C    05/31/2019 9:09 PM

## 2019-06-01 MED ORDER — POTASSIUM CITRATE ER 10 MEQ (1080 MG) PO TBCR: 10.0000 meq | EXTENDED_RELEASE_TABLET | Freq: Two times a day (BID) | ORAL | Status: AC

## 2019-06-01 MED ORDER — CHLORTHALIDONE 25 MG PO TABS: 25.0000 mg | ORAL_TABLET | Freq: Every day | ORAL | 3 refills | Status: DC

## 2019-06-01 NOTE — Telephone Encounter (Signed)
The surgeon usually holds most BP medications around surgery to protect the kidneys. Patients also typically have low BP post op, limiting resumption of some/all of their BP medicaitons. It appears her chlorthalidone was held at surgery and was never resumed post op because her BP was not running high.  Her recent BP readings appear high enough to tolerate resuming the chlorthalidone. Richardson Dopp, PA-C    06/01/2019 5:54 PM

## 2019-06-01 NOTE — Telephone Encounter (Signed)
Late entry from 1000 this AM:  Instructed patient to RESUME CHLORTHALIDONE 25 mg once daily. She reports she never took potassium citrate TID, only BID. So she will take BID.  She states her PCP will handle refills of these medications.   She would like to know when and why Chlorthalidone was stopped in case the medication has anything to do with kidney stone reoccurrence. Informed her it was stopped at her hospitalization in December, but she would like a reason why it was stopped.   She understands she will be called with medication information and to schedule labs.   To [employer]

## 2019-06-02 ENCOUNTER — Encounter (HOSPITAL_COMMUNITY): Payer: Self-pay | Admitting: *Deleted

## 2019-06-02 ENCOUNTER — Telehealth (HOSPITAL_COMMUNITY): Payer: Self-pay | Admitting: *Deleted

## 2019-06-02 DIAGNOSIS — Z951 Presence of aortocoronary bypass graft: Secondary | ICD-10-CM

## 2019-06-02 NOTE — Telephone Encounter (Signed)
Per patient request yesterday, left Scott's information on VM. Instructed the patient to call with questions or concerns.

## 2019-06-02 NOTE — Progress Notes (Signed)
Discharge Progress Report  Patient Details  Name: Adriana [name] MRN: [id number] Date of Birth: 01/22/1946 Referring Provider:     [place] from 02/05/2019 in [place]  Referring Provider  Dr. Burt Knack       Number of Visits: 3  Reason for Discharge:  Early Exit:  Department is closed due to COVID 19 Pandemic  Smoking History:  Social History   Tobacco Use  Smoking Status Former Smoker  . Quit date: 2004  . 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    Personal Goals Discharge:   Exercise Goals and Review:   Exercise Goals Re-Evaluation:   Nutrition & Weight - Outcomes:    Nutrition:   Nutrition Discharge:   Education Questionnaire Score:   06/02/19 [name] attended 3 exercise sessions on 02/05/19-02/27/19. Cardiac rehab closed due to COVID 19 Pandemic. We have attempted to contact [name] about participating in virtual cardiac rehab and have not hear back. Will discharge from cardiac rehab at this time.Barnet Pall, RN,BSN 06/02/2019 2:14 PM

## 2019-06-02 NOTE — Addendum Note (Signed)
Encounter addended by: Ivonne Andrew, RD on: 06/02/2019 1:47 PM  Actions taken: Flowsheet data copied forward

## 2019-12-28 DIAGNOSIS — N1832 Chronic kidney disease, stage 3b: Secondary | ICD-10-CM | POA: Insufficient documentation

## 2020-01-05 ENCOUNTER — Ambulatory Visit: Payer: Federal, State, Local not specified - PPO | Attending: Internal Medicine

## 2020-01-05 ENCOUNTER — Other Ambulatory Visit: Payer: Self-pay

## 2020-01-05 DIAGNOSIS — Z23 Encounter for immunization: Secondary | ICD-10-CM | POA: Diagnosis present

## 2020-01-05 NOTE — Progress Notes (Signed)
   Covid-19 Vaccination Clinic  Name:  Adriana [name]    MRN: [id number] DOB: June 06, 1946  01/05/2020  [name] was observed post Covid-19 immunization for 15 minutes without incidence. She was provided with Vaccine Information Sheet and instruction to access the V-Safe system.   [name] was instructed to call 911 with any severe reactions post vaccine: Marland Kitchen Difficulty breathing  . Swelling of your face and throat  . A fast heartbeat  . A bad rash all over your body  . Dizziness and weakness    Immunizations Administered    Name Date Dose VIS Date Route   Pfizer COVID-19 Vaccine 01/05/2020 10:56 AM 0.3 mL 11/27/2019 Intramuscular   Manufacturer: [employer], [employer]   Lot: [id number]   NDC: [address]-8

## 2020-01-26 ENCOUNTER — Ambulatory Visit: Payer: Federal, State, Local not specified - PPO | Attending: Internal Medicine

## 2020-01-26 DIAGNOSIS — Z23 Encounter for immunization: Secondary | ICD-10-CM | POA: Insufficient documentation

## 2020-01-26 NOTE — Progress Notes (Signed)
   Covid-19 Vaccination Clinic  Name:  Adriana [name]    MRN: [id number] DOB: Mar 30, 1946  01/26/2020  [name] was observed post Covid-19 immunization for 15 minutes without incidence. She was provided with Vaccine Information Sheet and instruction to access the V-Safe system.   [name] was instructed to call 911 with any severe reactions post vaccine: Marland Kitchen Difficulty breathing  . Swelling of your face and throat  . A fast heartbeat  . A bad rash all over your body  . Dizziness and weakness    Immunizations Administered    Name Date Dose VIS Date Route   Pfizer COVID-19 Vaccine 01/26/2020 11:34 AM 0.3 mL 11/27/2019 Intramuscular   Manufacturer: [employer], [employer]   Lot: [id number]   NDC: [address]-7

## 2020-02-09 ENCOUNTER — Ambulatory Visit: Payer: Federal, State, Local not specified - PPO

## 2020-03-16 IMAGING — CR DG CHEST 2V
2 series · 2 of 2 positions shown · non-contrast
Comparison: AP portable chest x-ray May 01, 2018

CLINICAL DATA: Preoperative examination prior to CABG. History of
diabetes, former smoker, previous episodes of pneumonia.

EXAM:
CHEST - 2 VIEW

[w chest pa]
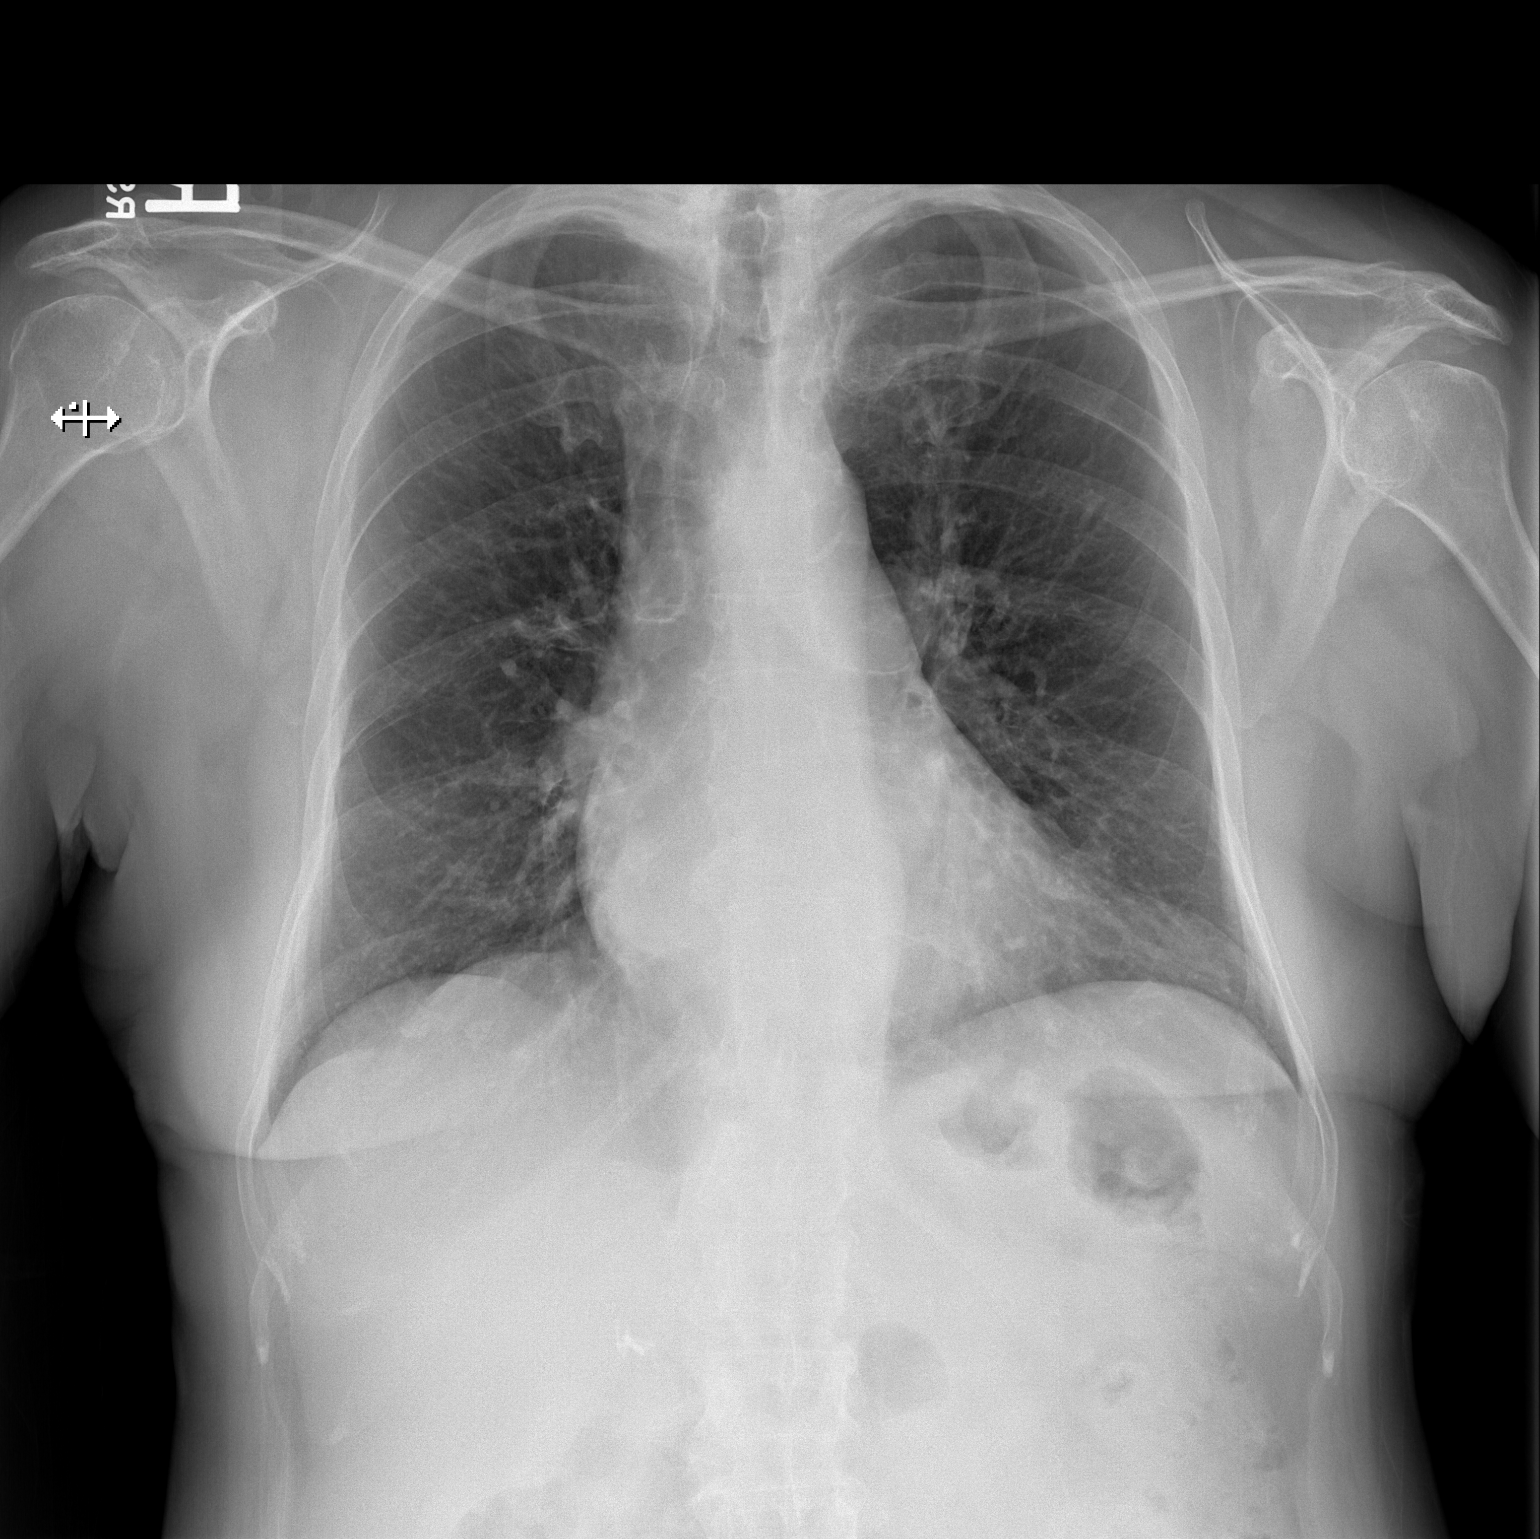

[w chest lat]
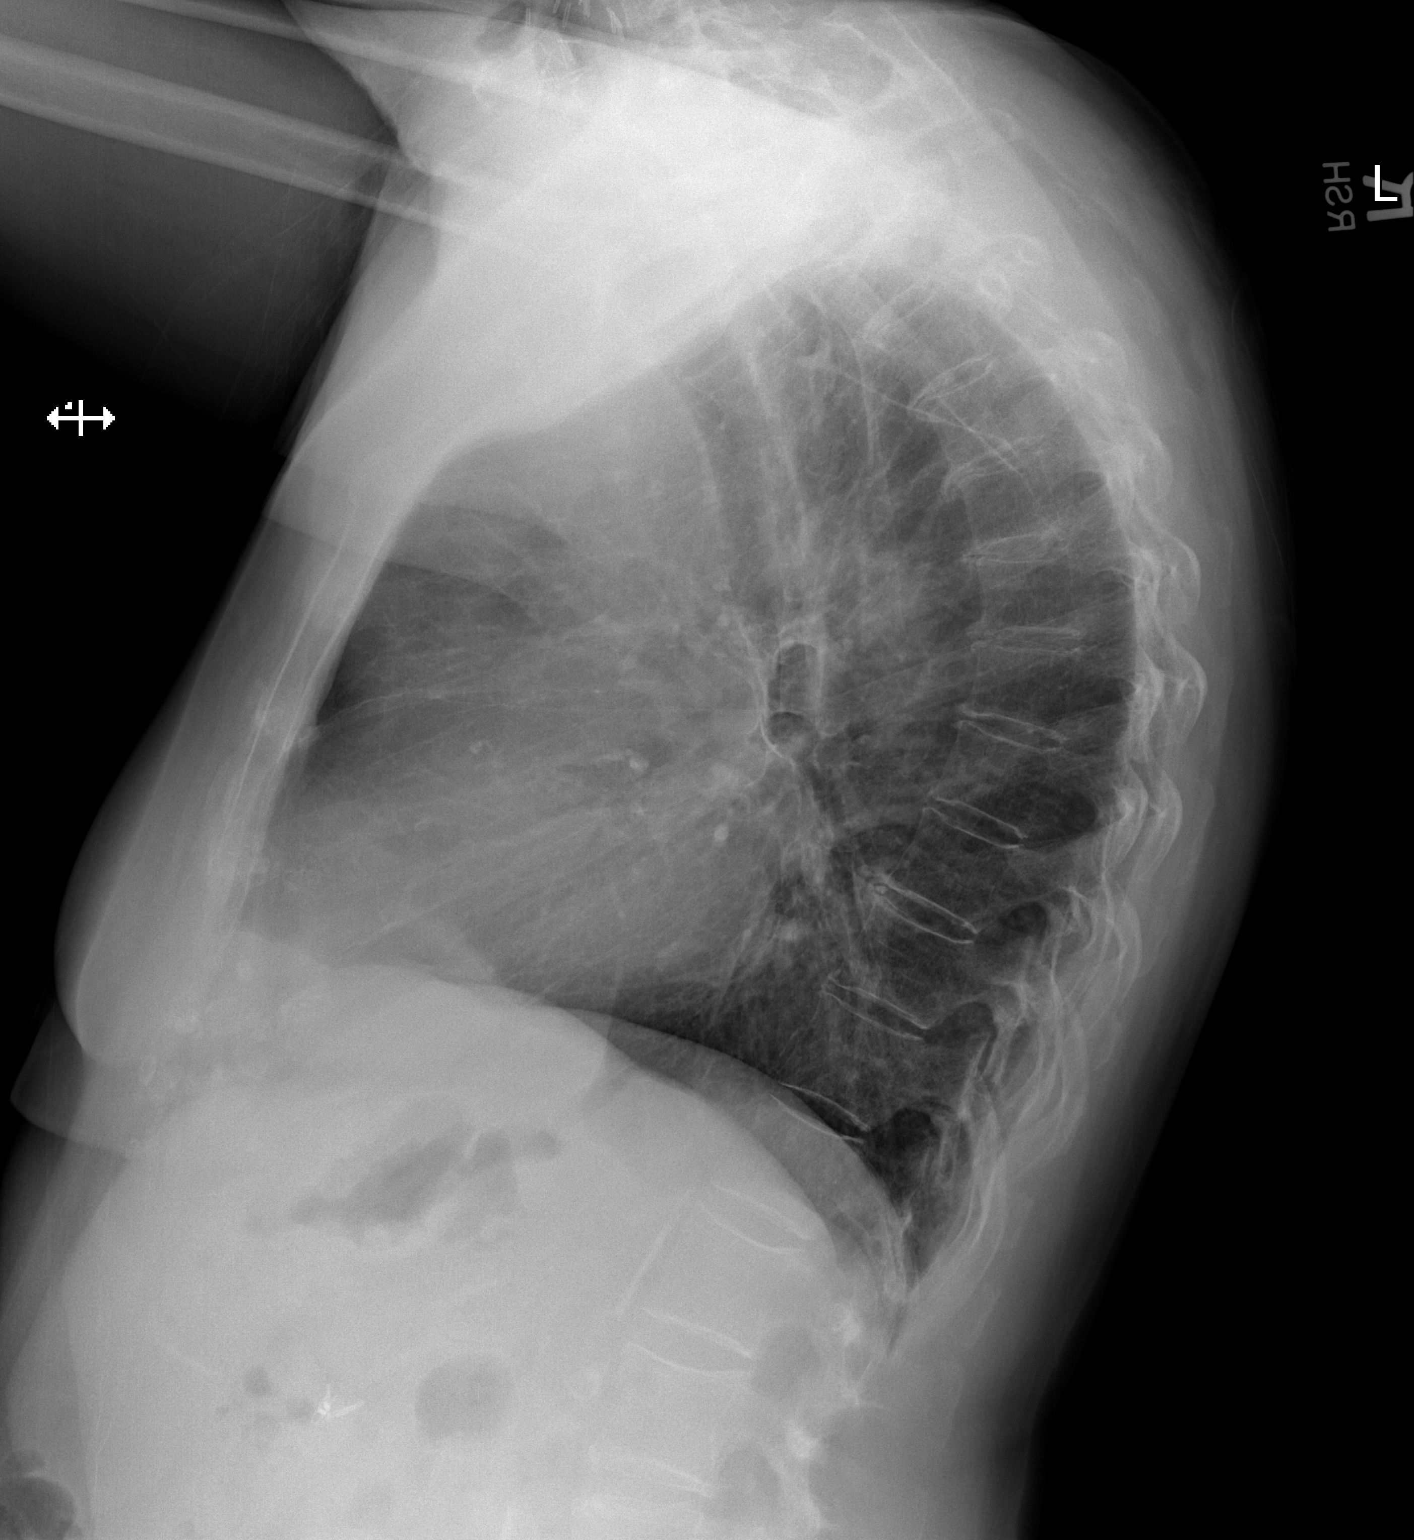

[2 of 2 positions shown; findings below may reference images not displayed]

FINDINGS: The lungs are adequately inflated. There is no focal infiltrate.
There is no pleural effusion. The heart and pulmonary vascularity
are normal. The mediastinum is normal in width. The bony thorax
exhibits no acute abnormality.
IMPRESSION: There is no active cardiopulmonary disease.

Thoracic aortic atherosclerosis.

## 2020-03-23 IMAGING — DX DG CHEST 1V PORT
1 series · 1 of 1 positions shown · non-contrast
Comparison: 11/18/2018

CLINICAL DATA: CABG

EXAM:
PORTABLE CHEST 1 VIEW

[chest ap]
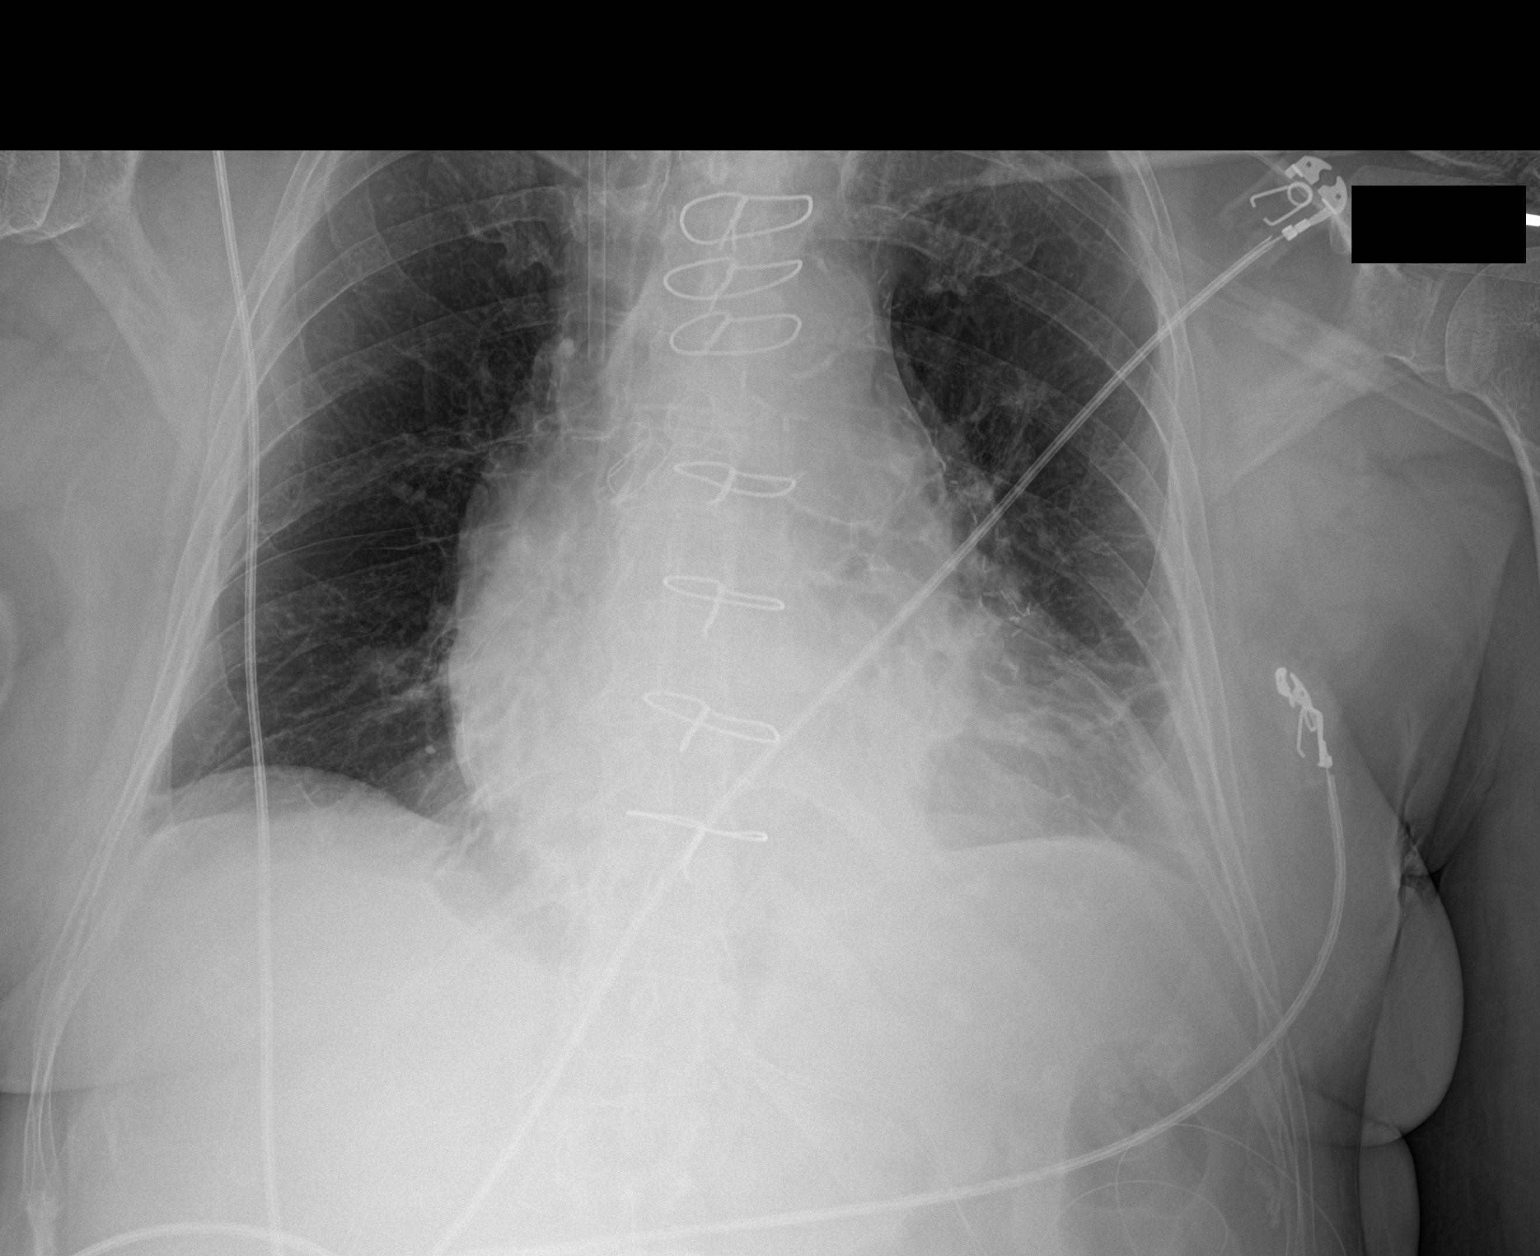

[1 of 1 positions shown; findings below may reference images not displayed]

FINDINGS: Swan-Ganz catheter removed. Right jugular sheath in the SVC. Left
chest tube removed. Mediastinal drain removed. Pericardial drain
removed.

Improvement in left lower lobe atelectasis. Negative for edema.
Negative for pneumothorax. Cardiac enlargement. Postop CABG.
IMPRESSION: Chest tubes and Swan-Ganz catheter removed

Improvement in left lower lobe atelectasis. Negative for
pneumothorax.

## 2020-03-28 IMAGING — CR DG CHEST 2V
2 series · 2 of 2 positions shown · non-contrast
Comparison: 11/19/2018

CLINICAL DATA: Pleural effusion

EXAM:
CHEST - 2 VIEW

[chest lat]
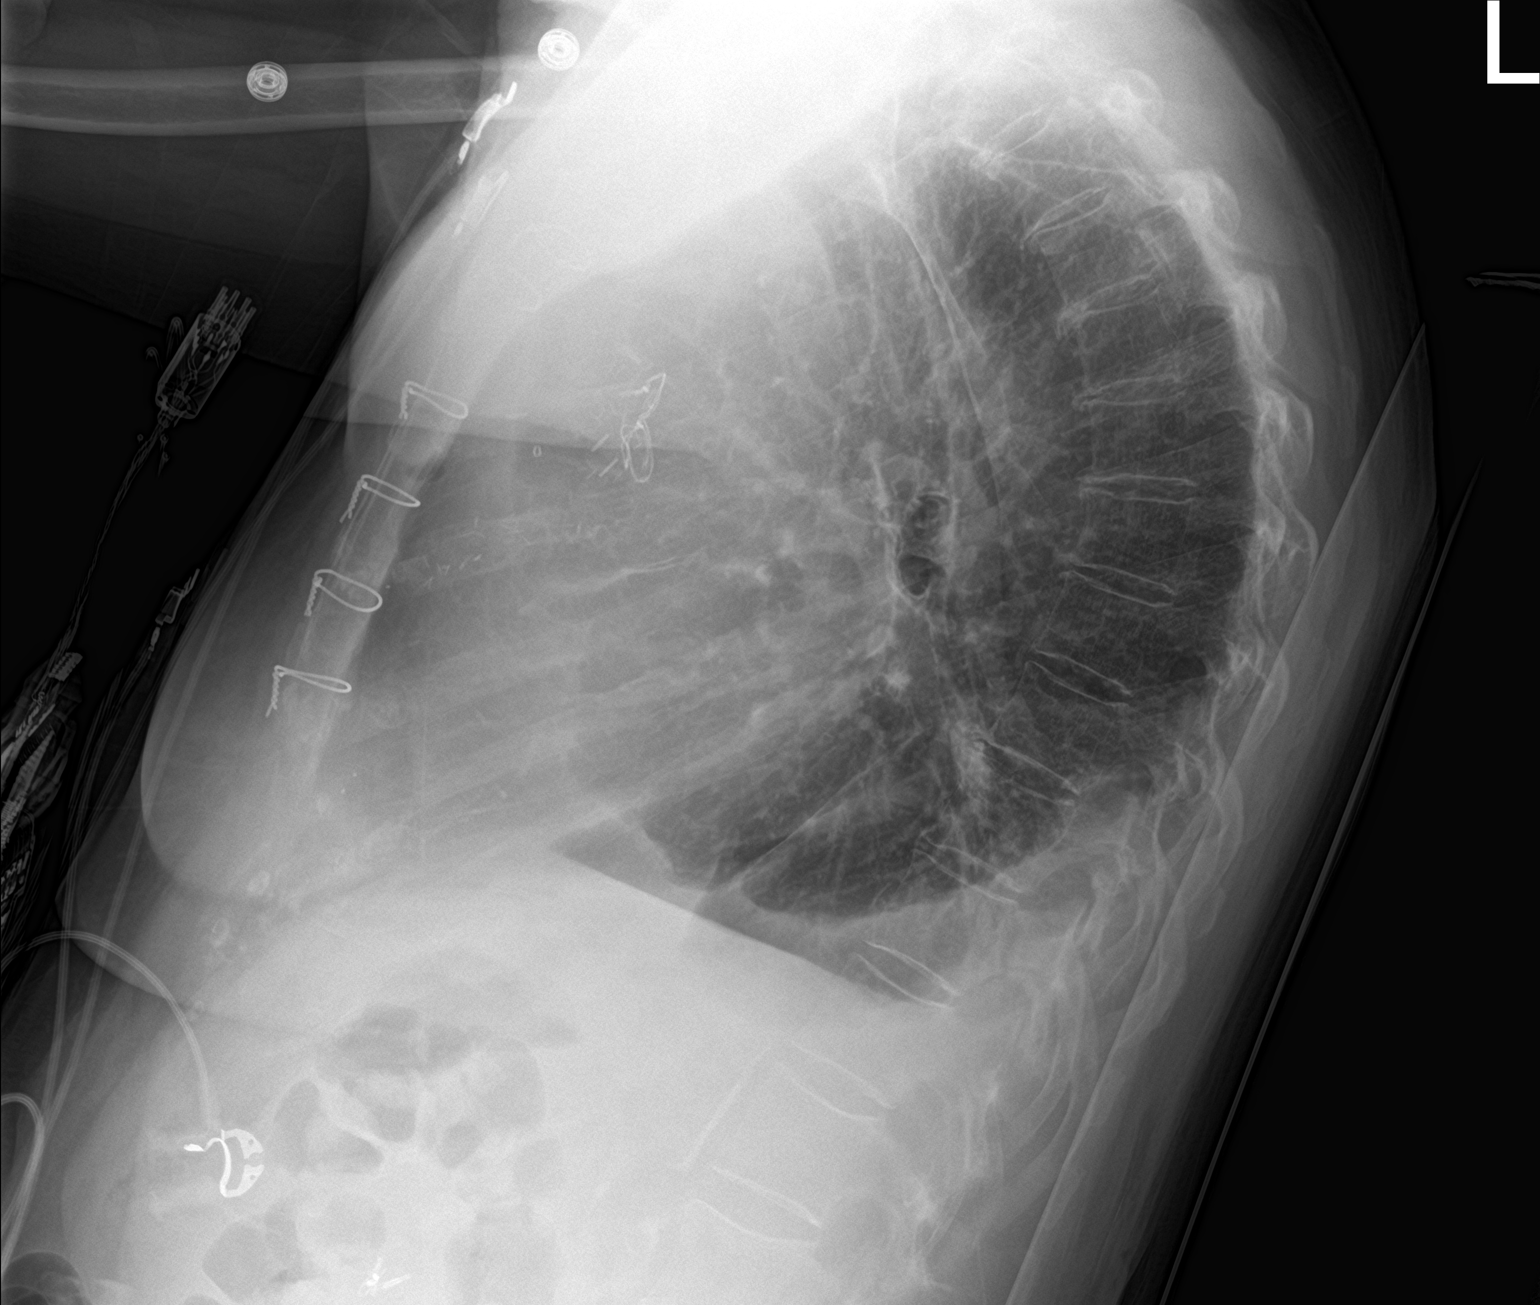

[chest ap]
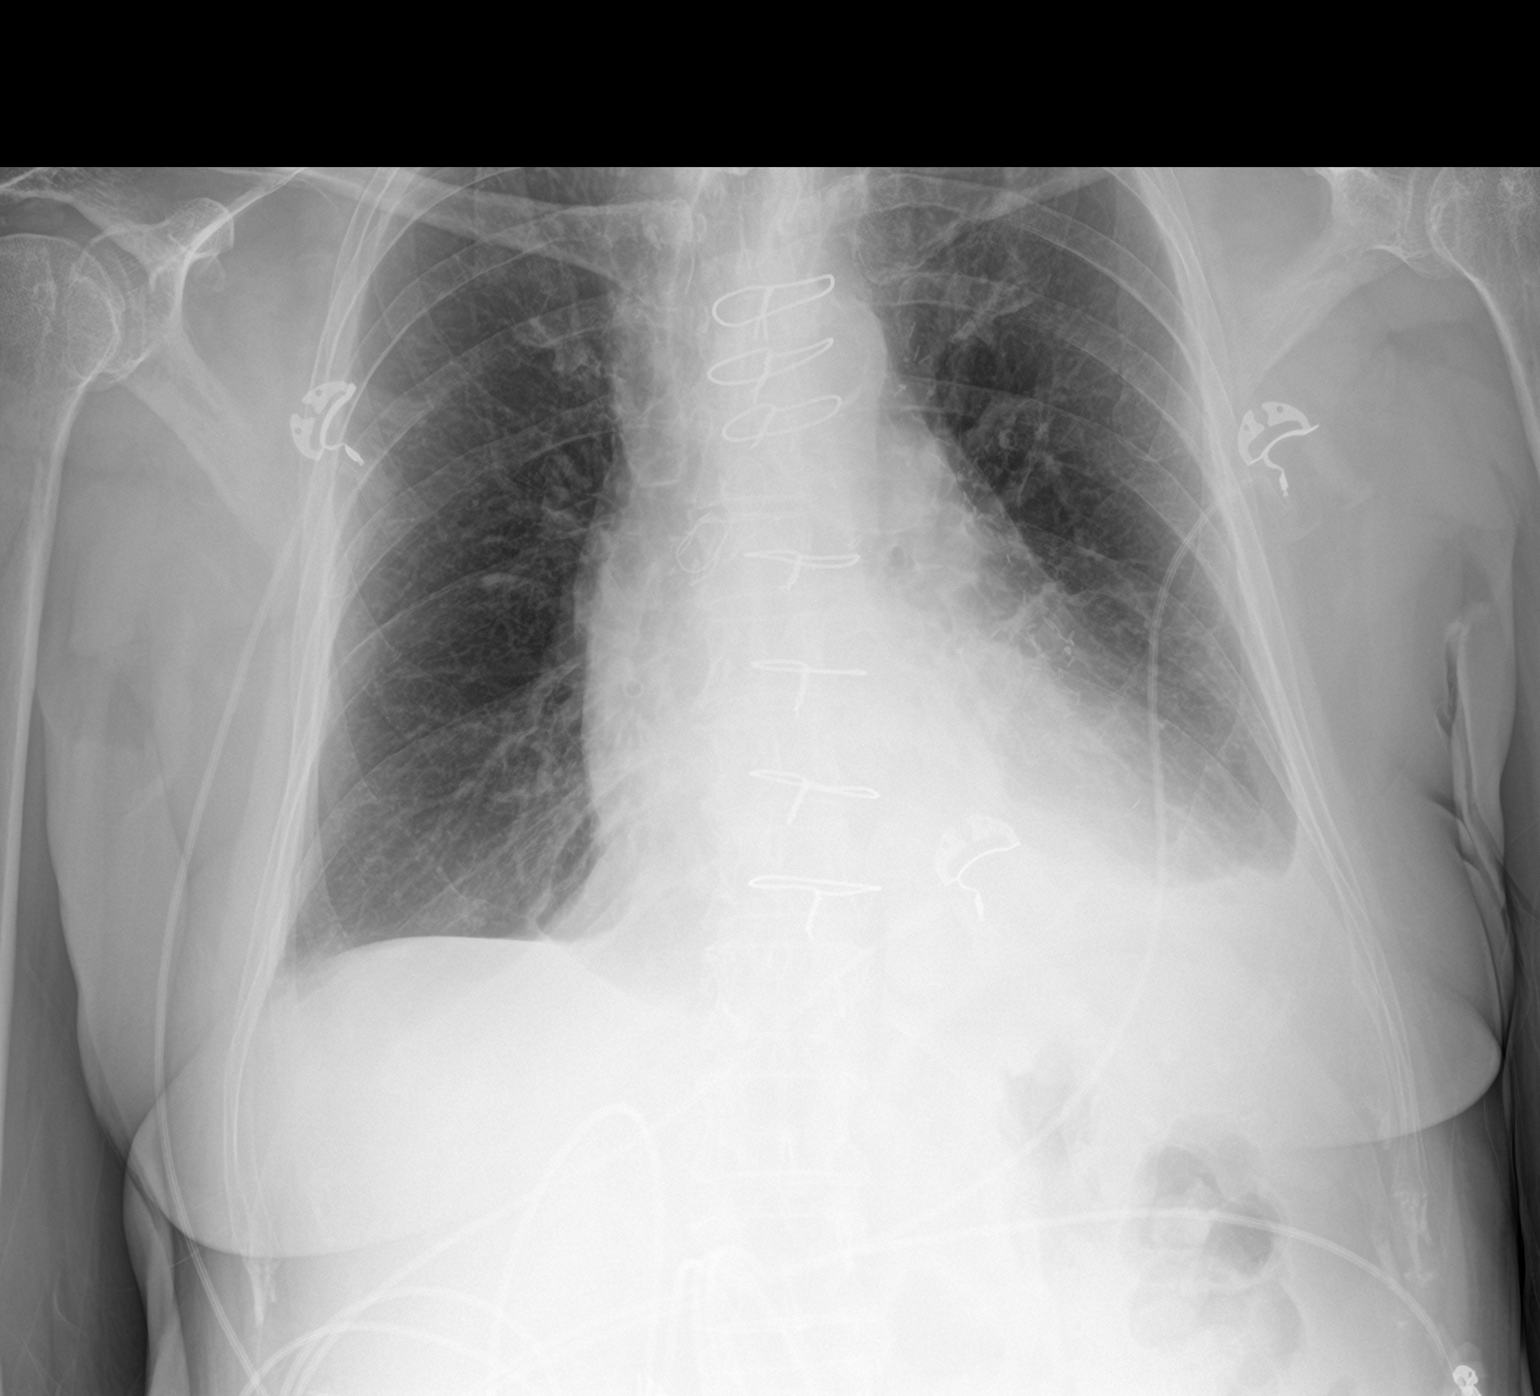

[2 of 2 positions shown; findings below may reference images not displayed]

FINDINGS: Cardiac shadow is enlarged but stable. Postsurgical changes are
again seen. Small left-sided pleural effusion and underlying
atelectasis is present. Tiny right-sided pleural effusion is noted
posteriorly. The right lung is otherwise clear. No acute bony
abnormality is noted.
IMPRESSION: Bilateral pleural effusions left greater than right with mild left
basilar atelectasis.

## 2020-05-30 NOTE — Progress Notes (Signed)
[facility] - Clinic Note  06/02/2020     CHIEF COMPLAINT Patient presents for Retina Follow Up   HISTORY OF PRESENT ILLNESS: Adriana [name] is a 74 y.o. female who presents to the clinic today for:   HPI    Retina Follow Up    Patient presents with  Other.  In left eye.  This started years ago.  Severity is moderate.  Duration of years.  Since onset it is gradually worsening.  I, the attending physician,  performed the HPI with the patient and updated documentation appropriately.          Comments    BS: 150-160 A1c: >9 per pt Pt states her vision is stable during the day but has blurry vision at night.  Pt denies eye pain or discomfort and denies any new or worsening floaters or fol OU.       Last edited by Rennis Chris, MD on 06/02/2020  8:47 AM. (History)    delayed f/u from Jan 2020 due to COVID-19 restrictions/concerns  Referring physician: [name], MD [address] [place],  [place] [address]  HISTORICAL INFORMATION:   Selected notes from the MEDICAL RECORD NUMBER Referred by Ronney Lion, NP for DM exam LEE:  Ocular Hx- PMH-depression, HTN, high cholesterol, stroke, DM (taking metformin)    CURRENT MEDICATIONS: No current outpatient medications on file. (Ophthalmic Drugs)   No current facility-administered medications for this visit. (Ophthalmic Drugs)   Current Outpatient Medications (Other)  Medication Sig  . atorvastatin (LIPITOR) 20 MG tablet Take 1 tablet (20 mg total) by mouth daily at 6 PM.  . buPROPion (WELLBUTRIN XL) 300 MG 24 hr tablet Take 300 mg by mouth daily.   . chlorthalidone (HYGROTON) 25 MG tablet Take 1 tablet (25 mg total) by mouth daily.  . Cholecalciferol (VITAMIN D) 2000 units CAPS Take 2,000 Units by mouth daily.   . clobetasol cream (TEMOVATE) 0.05 % Apply 1 application topically daily as needed (for psoriasis).   . Continuous Blood Gluc Sensor (FREESTYLE LIBRE 14 DAY SENSOR) MISC 1 each by  Does not apply route every 14 (fourteen) days. Change every 2 weeks  . cyanocobalamin 2000 MCG tablet Take 2,000 mcg by mouth daily.  . Insulin Glargine (BASAGLAR KWIKPEN) 100 UNIT/ML SOPN Inject 0.15 mLs (15 Units total) into the skin at bedtime.  . insulin lispro (HUMALOG) 100 UNIT/ML injection Inject 0.06 mLs (6 Units total) into the skin 3 (three) times daily before meals.  Marland Kitchen lisinopril (PRINIVIL,ZESTRIL) 5 MG tablet Take 5 mg by mouth daily.  . metFORMIN (GLUCOPHAGE) 1000 MG tablet Take 1,000 mg by mouth 2 (two) times daily with a meal.  . metoprolol tartrate (LOPRESSOR) 25 MG tablet Take 0.5 tablets (12.5 mg total) by mouth 2 (two) times daily.  . promethazine (PHENERGAN) 25 MG tablet Take 12.5 mg by mouth every evening.   . warfarin (COUMADIN) 5 MG tablet Take by mouth See admin instructions. Take 5 mg every Monday and Friday; 2.5 mg all other days of the week  . white petrolatum (VASELINE) GEL Apply 1 application topically daily as needed (for psoriasis).   No current facility-administered medications for this visit. (Other)      REVIEW OF SYSTEMS: ROS    Positive for: Endocrine, Cardiovascular, Eyes, Psychiatric   Negative for: Constitutional, Gastrointestinal, Neurological, Skin, Genitourinary, Musculoskeletal, HENT, Respiratory, Allergic/Imm, Heme/Lymph   Last edited by Corrinne Eagle on 06/02/2020  8:23 AM. (History)       ALLERGIES  Allergies  Allergen Reactions  . Aspirin Nausea And Vomiting  . Propofol Nausea And Vomiting    Confused BP goes up and down.    PAST MEDICAL HISTORY Past Medical History:  Diagnosis Date  . AKI (acute kidney injury) ([place]) 05/02/2018  . Carotid artery disease (HCC)    hx of bilat CEA // Carotid US 12/19: R 40-59; L 1-39  . Cellulitis of right leg 05/01/2018  . Coronary artery disease    Myoview 10/19 High Risk // LHC 11/19 w/ 3 v CAD // Echo 11/19: EF 50-55, gr 1 DD, ant-sept, inf-sept, apical HK // s/p CABG in 11/2018  . Depression     denies  . DVT (deep venous thrombosis) ([place])    in setting of Factor V Leiden mutation  . Essential hypertension   . Factor V Leiden mutation ([place])    Chronic Warfarin managed by PCP // hx of recurrent DVT; prior CVA  . GERD (gastroesophageal reflux disease)   . History of kidney stones   . History of sepsis 05/01/2018  . History of stroke 2004   right side weakness-resolved  . HLD (hyperlipidemia)   . Myocardial infarction ([facility] [place])   . Pneumonia    x2  . PONV (postoperative nausea and vomiting)    blood pressure up/down, confusion  . Poorly controlled type 2 diabetes mellitus ([facility])   . Sleep apnea    Past Surgical History:  Procedure Laterality Date  . ABDOMINAL HYSTERECTOMY    . BACK SURGERY    . CARDIAC CATHETERIZATION    . Carotid artery endarterectomy     right and left side with stents  . CATARACT EXTRACTION Bilateral   . CHOLECYSTECTOMY    . CORONARY ARTERY BYPASS GRAFT N/A 11/17/2018   Procedure: CORONARY ARTERY BYPASS GRAFTING (CABG), ON PUMP, TIMES FIVE, USING LEFT INTERNAL MAMMARY ARTERY AND ENDOSCOPICALLY HARVESTED LEFT GREATER SAPHENOUS VEIN;  Surgeon: Gaye Pollack, MD;  Location: [facility];  Service: Open Heart Surgery;  Laterality: N/A;  . LEFT HEART CATH AND CORONARY ANGIOGRAPHY N/A 11/04/2018   Procedure: LEFT HEART CATH AND CORONARY ANGIOGRAPHY;  Surgeon: Sherren Mocha, MD;  Location: [facility];  Service: Cardiovascular;  Laterality: N/A;  . LITHOTRIPSY     x3  . TEE WITHOUT CARDIOVERSION N/A 11/17/2018   Procedure: TRANSESOPHAGEAL ECHOCARDIOGRAM (TEE);  Surgeon: Gaye Pollack, MD;  Location: [facility];  Service: Open Heart Surgery;  Laterality: N/A;  . TONSILLECTOMY    . trigger fingers left and right Bilateral   . TUBAL LIGATION      FAMILY HISTORY Family History  Problem Relation Age of Onset  . Stroke Mother   . Lung cancer Father   . COPD Brother   . Diabetes Brother   . Cataracts Maternal Grandmother   . Diabetes Maternal Grandmother    . Amblyopia Neg Hx   . Blindness Neg Hx   . Glaucoma Neg Hx   . Hypertension Neg Hx   . Macular degeneration Neg Hx   . Retinal detachment Neg Hx   . Strabismus Neg Hx   . Retinitis pigmentosa Neg Hx     SOCIAL HISTORY Social History   Tobacco Use  . Smoking status: Former Smoker    Quit date: 2004    Years since quitting: 17.4  . Smokeless tobacco: Never Used  Vaping Use  . Vaping Use: Never used  Substance Use Topics  . Alcohol use: Not Currently  . Drug use: Not Currently  OPHTHALMIC EXAM:  Base Eye Exam    Visual Acuity (Snellen - Linear)      Right Left   Dist [place] 20/25 +1 20/25 -1   Dist ph [place] NI NI       Tonometry (Tonopen, 8:28 AM)      Right Left   Pressure 14 15       Pupils      Dark Light Shape React APD   Right 3 2 Round Brisk 0   Left 3 2 Round Brisk 0       Visual Fields      Left Right    Full Full       Extraocular Movement      Right Left    Full Full       Neuro/Psych    Oriented x3: Yes   Mood/Affect: Normal       Dilation    Both eyes: 1.0% Mydriacyl, 2.5% Phenylephrine @ 8:28 AM        Slit Lamp and Fundus Exam    Slit Lamp Exam      Right Left   Lids/Lashes Dermatochalasis - upper lid, Meibomian gland dysfunction; inspissated meibomian gland nasal upper lid Dermatochalasis - upper lid, Meibomian gland dysfunction   Conjunctiva/Sclera White and quiet White and quiet   Cornea Well healed cataract wounds, sub epi scar at 1200, Arcus Well healed cataract wounds, Arcus   Anterior Chamber Deep and quiet Deep and quiet   Iris Round and dilated, No NVI Round and dilated, No NVI   Lens Tecnis multifocal PC IOL in good position Tecnis multifocal PC IOL in good position   Vitreous Vitreous syneresis, Posterior vitreous detachment Vitreous syneresis       Fundus Exam      Right Left   Disc Pink and Sharp Pink and Sharp   C/D Ratio 0.5 0.4   Macula Blunted foveal reflex, retinal drusen, Retinal pigment epithelial  mottling, No heme or edema Blunted foveal reflex, Epiretinal membrane, drusen, Retinal pigment epithelial mottling, no heme or edema   Vessels Vascular attenuation Vascular attenuation   Periphery Attached, inferior cobblestoning, chorioretinal atrophy at 1030, +reticular degeneration, no heme Attached, scattered cobblestoning / chorioretinal atrophy, +reticular degeneration, no heme          IMAGING AND PROCEDURES  Imaging and Procedures for @TODAY @  OCT, Retina - OU - Both Eyes       Right Eye Quality was good. Central Foveal Thickness: 268. Progression has been stable. Findings include normal foveal contour, no IRF, no SRF, retinal drusen  (Mild drusen).   Left Eye Quality was good. Central Foveal Thickness: 297. Progression has improved. Findings include abnormal foveal contour, retinal drusen , no IRF, no SRF, epiretinal membrane, macular pucker (Mild interval improvement in ERM and foveal contour).   Notes *Images captured and stored on drive  Diagnosis / Impression:  OD: NFP, No IRF/SRF OS: mild ERM with interval improvement in foveal contour  Clinical management:  See below  Abbreviations: NFP - Normal foveal profile. CME - cystoid macular edema. PED - pigment epithelial detachment. IRF - intraretinal fluid. SRF - subretinal fluid. EZ - ellipsoid zone. ERM - epiretinal membrane. ORA - outer retinal atrophy. ORT - outer retinal tubulation. SRHM - subretinal hyper-reflective material                  ASSESSMENT/PLAN:    ICD-10-CM   1. Diabetes mellitus type 2 without retinopathy (HCC)  E11.9   2. Epiretinal  membrane (ERM) of left eye  H35.372   3. Retinal edema  H35.81 OCT, Retina - OU - Both Eyes  4. Pseudophakia of both eyes  Z96.1     1. Diabetes mellitus, type 2 without retinopathy - The incidence, risk factors for progression, natural history and treatment options for diabetic retinopathy  were discussed with patient.   - The need for close monitoring  of blood glucose, blood pressure, and serum lipids, avoiding cigarette or any type of tobacco, and the need for long term follow up was also discussed with patient. - f/u in 1 year, sooner prn  2. Epiretinal membrane OS-  - The natural history, anatomy, potential for loss of vision, and treatment options including vitrectomy techniques and the complications of endophthalmitis, retinal detachment, vitreous hemorrhage, cataract progression and permanent vision loss discussed with the patient. - BCVA improved to 20/25 OS - OCT shows interval improvement of ERM and foveal profile - not visually significant -- no surgical intervention indicated at this time - recommend monitoring - F/U 1 yr, sooner prn -- DFE, OCT  3. No retinal edema on exam or OCT  4. Pseudophakia OU  - s/p CE/IOL OU -- Tecnis multifocal IOLs by Dr. Gershon Cull  - beautiful surgeries, doing well  - monitor    Ophthalmic Meds Ordered this visit:  No orders of the defined types were placed in this encounter.      Return in about 1 year (around 06/02/2021) for f/u ERM OS -- Dilated Exam, OCT.  There are no Patient Instructions on file for this visit.   Explained the diagnoses, plan, and follow up with the patient and they expressed understanding.  Patient expressed understanding of the importance of proper follow up care.    Karie Chimera, M.D., Ph.D. Diseases & Surgery of the Retina and Vitreous [facility]   I have reviewed the above documentation for accuracy and completeness, and I agree with the above. Karie Chimera, M.D., Ph.D. 06/02/20 9:57 AM   Abbreviations: M myopia (nearsighted); A astigmatism; H hyperopia (farsighted); P presbyopia; Mrx spectacle prescription;  CTL contact lenses; OD right eye; OS left eye; OU both eyes  XT exotropia; ET esotropia; PEK punctate epithelial keratitis; PEE punctate epithelial erosions; DES dry eye syndrome; MGD meibomian gland dysfunction; ATs  artificial tears; PFAT's preservative free artificial tears; NSC nuclear sclerotic cataract; PSC posterior subcapsular cataract; ERM epi-retinal membrane; PVD posterior vitreous detachment; RD retinal detachment; DM diabetes mellitus; DR diabetic retinopathy; NPDR non-proliferative diabetic retinopathy; PDR proliferative diabetic retinopathy; CSME clinically significant macular edema; DME diabetic macular edema; dbh dot blot hemorrhages; CWS cotton wool spot; POAG primary open angle glaucoma; C/D cup-to-disc ratio; HVF humphrey visual field; GVF goldmann visual field; OCT optical coherence tomography; IOP intraocular pressure; BRVO Branch retinal vein occlusion; CRVO central retinal vein occlusion; CRAO central retinal artery occlusion; BRAO branch retinal artery occlusion; RT retinal tear; SB scleral buckle; PPV pars plana vitrectomy; VH Vitreous hemorrhage; PRP panretinal laser photocoagulation; IVK intravitreal kenalog; VMT vitreomacular traction; MH Macular hole;  NVD neovascularization of the disc; NVE neovascularization elsewhere; AREDS age related eye disease study; ARMD age related macular degeneration; POAG primary open angle glaucoma; EBMD epithelial/anterior basement membrane dystrophy; ACIOL anterior chamber intraocular lens; IOL intraocular lens; PCIOL posterior chamber intraocular lens; Phaco/IOL phacoemulsification with intraocular lens placement; PRK photorefractive keratectomy; LASIK laser assisted in situ keratomileusis; HTN hypertension; DM diabetes mellitus; COPD chronic obstructive pulmonary disease

## 2020-06-02 ENCOUNTER — Ambulatory Visit (INDEPENDENT_AMBULATORY_CARE_PROVIDER_SITE_OTHER): Payer: Federal, State, Local not specified - PPO | Admitting: Ophthalmology

## 2020-06-02 ENCOUNTER — Encounter (INDEPENDENT_AMBULATORY_CARE_PROVIDER_SITE_OTHER): Payer: Self-pay | Admitting: Ophthalmology

## 2020-06-02 ENCOUNTER — Other Ambulatory Visit: Payer: Self-pay

## 2020-06-02 DIAGNOSIS — H3581 Retinal edema: Secondary | ICD-10-CM

## 2020-06-02 DIAGNOSIS — Z961 Presence of intraocular lens: Secondary | ICD-10-CM

## 2020-06-02 DIAGNOSIS — H35372 Puckering of macula, left eye: Secondary | ICD-10-CM | POA: Diagnosis not present

## 2020-06-02 DIAGNOSIS — E119 Type 2 diabetes mellitus without complications: Secondary | ICD-10-CM | POA: Diagnosis not present

## 2020-07-14 ENCOUNTER — Encounter: Payer: Self-pay | Admitting: Neurology

## 2020-07-18 ENCOUNTER — Encounter: Payer: Self-pay | Admitting: Neurology

## 2020-07-18 ENCOUNTER — Other Ambulatory Visit: Payer: Self-pay

## 2020-07-18 ENCOUNTER — Ambulatory Visit (INDEPENDENT_AMBULATORY_CARE_PROVIDER_SITE_OTHER): Payer: Federal, State, Local not specified - PPO | Admitting: Neurology

## 2020-07-18 VITALS — BP 113/64 | HR 69 | Ht 66.0 in | Wt 153.0 lb

## 2020-07-18 DIAGNOSIS — G4727 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: Secondary | ICD-10-CM | POA: Insufficient documentation

## 2020-07-18 DIAGNOSIS — D6851 Activated protein C resistance: Secondary | ICD-10-CM | POA: Diagnosis not present

## 2020-07-18 DIAGNOSIS — Z951 Presence of aortocoronary bypass graft: Secondary | ICD-10-CM

## 2020-07-18 DIAGNOSIS — G4733 Obstructive sleep apnea (adult) (pediatric): Secondary | ICD-10-CM | POA: Insufficient documentation

## 2020-07-18 DIAGNOSIS — E1165 Type 2 diabetes mellitus with hyperglycemia: Secondary | ICD-10-CM

## 2020-07-18 DIAGNOSIS — I4891 Unspecified atrial fibrillation: Secondary | ICD-10-CM

## 2020-07-18 DIAGNOSIS — I6523 Occlusion and stenosis of bilateral carotid arteries: Secondary | ICD-10-CM

## 2020-07-18 DIAGNOSIS — I82402 Acute embolism and thrombosis of unspecified deep veins of left lower extremity: Secondary | ICD-10-CM

## 2020-07-18 DIAGNOSIS — I10 Essential (primary) hypertension: Secondary | ICD-10-CM

## 2020-07-18 DIAGNOSIS — I2510 Atherosclerotic heart disease of native coronary artery without angina pectoris: Secondary | ICD-10-CM

## 2020-07-18 DIAGNOSIS — I9789 Other postprocedural complications and disorders of the circulatory system, not elsewhere classified: Secondary | ICD-10-CM

## 2020-07-18 DIAGNOSIS — I6300 Cerebral infarction due to thrombosis of unspecified precerebral artery: Secondary | ICD-10-CM

## 2020-07-18 MED ORDER — LORAZEPAM 0.5 MG PO TABS
0.5000 mg | ORAL_TABLET | Freq: Every evening | ORAL | 0 refills | Status: DC | PRN
Start: 2020-07-18 — End: 2020-08-10

## 2020-07-18 NOTE — Progress Notes (Signed)
SLEEP MEDICINE CLINIC    Provider:  Melvyn Novas, MD  Primary Care Physician:  [name], MD [address] [place] [address]     Referring Provider: [name], Md [address] [place],  [place] [address]          Chief Complaint according to patient   Patient presents with:    . New Patient (Initial Visit)     pt alone, rm 11. pt states that she had OSA and she had CPAP and she attempted to use the CPAP and the mask made her feel claustrophobic(this one covered mouth and nose) she has heard that changes in the mask have been done. averages 7 hrs of sleep but her sleep routine is off- she goes asleep later and sleeps later.  Not currently on CPAP- can't tolerate .her first PSG was from  2011 and had another completed prior to moving here in 2017.       HISTORY OF PRESENT ILLNESS:  Adriana [name] is a 74 year- old Caucasian female patient seen here as a referral on 07/18/2020 from Dr Evlyn Kanner.  Chief concern according to patient :  I will need a new sleep evaluation to get a CPAP and interface that works for me.    I have the pleasure of seeing Adriana [name] today, a right -handed Caucasian female with a possible sleep disorder.  She has a  has a past medical history of AKI (acute kidney injury) (HCC) (05/02/2018), Carotid artery disease (HCC), Cellulitis of right leg (05/01/2018), Coronary artery disease, Depression, DVT (deep venous thrombosis) (HCC), Essential hypertension, Factor V Leiden mutation (HCC), GERD (gastroesophageal reflux disease), History of kidney stones, History of sepsis (05/01/2018), History of stroke (2004), HLD (hyperlipidemia), Myocardial infarction (HCC), Pneumonia, PONV (postoperative nausea and vomiting), Poorly controlled type 2 diabetes mellitus (HCC), Seizures (HCC), and Sleep apnea. she suffers form kidney stones.   Her sleep study from 2011: The patient's sleep study analysis from [place] [facility] was  reviewed here this was a study performed on 27 July 2010.  The patient has moderate obstructive sleep apnea with an AHI of 23 in all sleep positions in sleep stages and REM she reached severe apnea at 35/h there were a total of 111 events.  Oxygen nadir was 81%.  There was minimal tachycardia noted snoring was moderate sleep efficiency was rather poor at 64%.  This was signed by Dr. Tildon Husky, MD.  He recommended CPAP titration at the time for which I have no documentation.  Patient has a history of coronary artery disease a status post CABG, "carotid artery disease stenosis aortic atherosclerosis, diastolic dysfunction atrial fibrillation, paroxysmal, hypertension hyperlipidemia she has a history of stroke since 2004 and felt that this interfered the most with her sleep circadian rhythm.   I also reviewed her medication list which is very long.     Sleep relevant medical history: " my sleep pattern is messed up since my stroke- I sleep 6-7 hours in daytime" 4-6 AM is my time to start sleeping.  CVA in 2004.   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    Social history: Patient is retired from [employer], Media planner.  she lives in a household with her husband . 4 adult children. 10 grandchildren, 4 great-grandchildren.  Pets are not present. Tobacco use: 1.5 ppd- quit at the time of her  stroke.   ETOH use never,  Caffeine intake in form of Coffee( 3 cups a week) Soda( caffeine free ) Tea (30 ounces 3 times a day) but no energy drinks.      Sleep habits are as follows: The patient's dinner time is between 6-7 PM. The patient goes to bed at 4-6 AM and continues to sleep for 7 hours, wakes for some bathroom breaks.   The preferred sleep position is right laterally, with the support of 3 pillows.  Dreams are reportedly vivid.  11-15, afternoon s the usual rise time.  The patient wakes up spontaneously.  She  reports not feeling refreshed or restored in AM, with symptoms such as dry mouth,  morning headaches, and residual fatigue.  Naps are taken infrequently.  Review of Systems: Out of a complete 14 system review, the patient complains of only the following symptoms, and all other reviewed systems are negative.:  Fatigue, sleepiness , snoring, fragmented sleep, delayed sleep phase syndrome-  How likely are you to doze in the following situations: 0 = not likely, 1 = slight chance, 2 = moderate chance, 3 = high chance   Sitting and Reading? Watching Television? Sitting inactive in a public place (theater or meeting)? As a passenger in a car for an hour without a break? Lying down in the afternoon when circumstances permit? Sitting and talking to someone? Sitting quietly after lunch without alcohol? In a car, while stopped for a few minutes in traffic?   Total = 4 / 24 points   FSS endorsed at 35/ 63 points.   Social History   Socioeconomic History  . Marital status: Married    Spouse name: [name]  . Number of children: Not on file  . Years of education: 72  . Highest education level: High school graduate  Occupational History  . Occupation: Retired  Tobacco Use  . Smoking status: Former Smoker    Packs/day: 1.00    Years: 38.00    Pack years: 38.00    Quit date: 2004    Years since quitting: 17.5  . Smokeless tobacco: Never Used  Vaping Use  . Vaping Use: Never used  Substance and Sexual Activity  . Alcohol use: Not Currently  . Drug use: Not Currently  . Sexual activity: Not on file  Other Topics Concern  . Not on file  Social History Narrative  . Not on file   Social Determinants of Health   Financial Resource Strain:   . Difficulty of Paying Living Expenses:   Food Insecurity:   . Worried About Programme researcher, broadcasting/film/video in the Last Year:   . Barista in the Last Year:   Transportation Needs:   . Freight forwarder (Medical):   Marland Kitchen Lack of Transportation  (Non-Medical):   Physical Activity:   . Days of Exercise per Week:   . Minutes of Exercise per Session:   Stress:   . Feeling of Stress :   Social Connections:   . Frequency of Communication with Friends and Family:   . Frequency of Social Gatherings with Friends and Family:   . Attends Religious Services:   . Active Member of Clubs or Organizations:   . Attends Banker Meetings:   Marland Kitchen Marital Status:     Family History  Problem Relation Age of Onset  . Stroke Mother   . Lung cancer Father   . COPD Brother   . Diabetes Brother   . Cataracts Maternal Grandmother   .  Diabetes Maternal Grandmother   . Amblyopia Neg Hx   . Blindness Neg Hx   . Glaucoma Neg Hx   . Hypertension Neg Hx   . Macular degeneration Neg Hx   . Retinal detachment Neg Hx   . Strabismus Neg Hx   . Retinitis pigmentosa Neg Hx     Past Medical History:  Diagnosis Date  . AKI (acute kidney injury) (HCC) 05/02/2018  . Carotid artery disease (HCC)    hx of bilat CEA // Carotid US 12/19: R 40-59; L 1-39  . Cellulitis of right leg 05/01/2018  . Coronary artery disease    Myoview 10/19 High Risk // LHC 11/19 w/ 3 v CAD // Echo 11/19: EF 50-55, gr 1 DD, ant-sept, inf-sept, apical HK // s/p CABG in 11/2018  . Depression    denies  . DVT (deep venous thrombosis) (HCC)    in setting of Factor V Leiden mutation  . Essential hypertension   . Factor V Leiden mutation (HCC)    Chronic Warfarin managed by PCP // hx of recurrent DVT; prior CVA  . GERD (gastroesophageal reflux disease)   . History of kidney stones   . History of sepsis 05/01/2018  . History of stroke 2004   right side weakness-resolved  . HLD (hyperlipidemia)   . Myocardial infarction (HCC)   . Pneumonia    x2  . PONV (postoperative nausea and vomiting)    blood pressure up/down, confusion  . Poorly controlled type 2 diabetes mellitus (HCC)   . Seizures (HCC)   . Sleep apnea     Past Surgical History:  Procedure Laterality Date   . ABDOMINAL HYSTERECTOMY  1989  . BACK SURGERY    . CARDIAC CATHETERIZATION    . Carotid artery endarterectomy     right and left side with stents  . CATARACT EXTRACTION Bilateral 2017  . CHOLECYSTECTOMY    . CORONARY ARTERY BYPASS GRAFT N/A 11/17/2018   Procedure: CORONARY ARTERY BYPASS GRAFTING (CABG), ON PUMP, TIMES FIVE, USING LEFT INTERNAL MAMMARY ARTERY AND ENDOSCOPICALLY HARVESTED LEFT GREATER SAPHENOUS VEIN;  Surgeon: Alleen Borne, MD;  Location: [facility] OR;  Service: Open Heart Surgery;  Laterality: N/A;  . LEFT HEART CATH AND CORONARY ANGIOGRAPHY N/A 11/04/2018   Procedure: LEFT HEART CATH AND CORONARY ANGIOGRAPHY;  Surgeon: [name], MD;  Location: [facility] INVASIVE CV LAB;  Service: Cardiovascular;  Laterality: N/A;  . LITHOTRIPSY     x3  . TEE WITHOUT CARDIOVERSION N/A 11/17/2018   Procedure: TRANSESOPHAGEAL ECHOCARDIOGRAM (TEE);  Surgeon: Alleen Borne, MD;  Location[facility];  Service: Open Heart Surgery;  Laterality: N/A;  . TONSILLECTOMY    . trigger fingers left and right Bilateral   . TUBAL LIGATION       Current Outpatient Medications on File Prior to Visit  Medication Sig Dispense Refill  . buPROPion (WELLBUTRIN XL) 300 MG 24 hr tablet Take 300 mg by mouth daily.   2  . Cholecalciferol (VITAMIN D) 2000 units CAPS Take 2,000 Units by mouth daily.     . clobetasol cream (TEMOVATE) 0.05 % Apply 1 application topically daily as needed (for psoriasis).     . Continuous Blood Gluc Sensor (FREESTYLE LIBRE 14 DAY SENSOR) MISC 1 each by Does not apply route every 14 (fourteen) days. Change every 2 weeks 2 each 11  . cyanocobalamin 2000 MCG tablet Take 2,000 mcg by mouth daily.    . insulin lispro (HUMALOG) 100 UNIT/ML injection Inject 0.06 mLs (6 Units total)  into the skin 3 (three) times daily before meals. (Patient taking differently: Inject 12 Units into the skin 3 (three) times daily before meals. ) 10 mL 11  . lisinopril (PRINIVIL,ZESTRIL) 5 MG tablet Take 5 mg by mouth  daily.    . potassium citrate (UROCIT-K) 10 MEQ (1080 MG) SR tablet Take 10 mEq by mouth 2 (two) times daily.    Marland Kitchen. warfarin (COUMADIN) 5 MG tablet Take by mouth See admin instructions. Take 5 mg every Monday and Friday; 2.5 mg all other days of the week    . chlorthalidone (HYGROTON) 25 MG tablet Take 1 tablet (25 mg total) by mouth daily. 90 tablet 3  . FARXIGA 10 MG TABS tablet Take 10 mg by mouth daily.    . metFORMIN (GLUCOPHAGE) 1000 MG tablet Take 1,000 mg by mouth 2 (two) times daily with a meal.    . metoprolol tartrate (LOPRESSOR) 25 MG tablet Take 0.5 tablets (12.5 mg total) by mouth 2 (two) times daily. 90 tablet 3  . promethazine (PHENERGAN) 12.5 MG tablet Take 12.5 mg by mouth at bedtime.    . promethazine (PHENERGAN) 25 MG tablet Take 12.5 mg by mouth every evening.   1  . white petrolatum (VASELINE) GEL Apply 1 application topically daily as needed (for psoriasis).     No current facility-administered medications on file prior to visit.    Allergies  Allergen Reactions  . Aspirin Nausea And Vomiting  . Propofol Nausea And Vomiting    Confused BP goes up and down.    Physical exam:  Today's Vitals   07/18/20 0954  BP: (!) 113/64  Pulse: 69  Weight: 153 lb (69.4 kg)  Height: 5\' 6"  (1.676 m)   Body mass index is 24.69 kg/m.   Wt Readings from Last 3 Encounters:  07/18/20 153 lb (69.4 kg)  03/17/19 149 lb (67.6 kg)  02/05/19 147 lb 4.3 oz (66.8 kg)     Ht Readings from Last 3 Encounters:  07/18/20 5\' 6"  (1.676 m)  03/17/19 5' 5.25" (1.657 m)  02/05/19 5' 5.25" (1.657 m)      General: The patient is awake, alert and appears not in acute distress. The patient is well groomed. Head: Normocephalic, atraumatic. Neck is supple. Mallampati 2,  neck circumference: 17 inches . Nasal airflow   patent.  Retrognathia is not seen.  Dental status: intact  Cardiovascular:  Regular rate and cardiac rhythm by pulse,  without distended neck veins. Respiratory: Lungs are  clear to auscultation.  Skin:  Without evidence of ankle edema, or rash. Trunk: The patient's posture is erect.   Neurologic exam : The patient is awake and alert, oriented to place and time.   Memory subjective described as intact.  Attention span & concentration ability appears normal.  Speech is fluent,  without  dysarthria, dysphonia or aphasia.  Mood and affect are appropriate.   Cranial nerves: no loss of smell or taste reported  Pupils are equal and briskly reactive to light. She had bilateral cataract surgery- Funduscopic exam deferred. .  Extraocular movements in vertical and horizontal planes were intact and without nystagmus. No Diplopia. Visual fields by finger perimetry are intact. Hearing was intact to soft voice and finger rubbing.    Facial sensation intact to fine touch.  Facial motor strength is symmetric and tongue and uvula move midline.  Neck ROM : rotation, tilt and flexion extension were normal for age and shoulder shrug was symmetrical.    Motor exam:  Symmetric  bulk, tone and ROM. Her right arm was weakened by stroke-   Normal tone without cog- wheeling, symmetric grip strength .   Sensory:  Fine touch  and vibration were  normal.  Proprioception tested in the upper extremities was normal.   Coordination: Rapid alternating movements in the fingers/hands were of normal speed.  The Finger-to-nose maneuver was intact without evidence of ataxia, dysmetria or tremor.   Gait and station: Patient could rise unassisted from a seated position, walked without assistive device.  Stance is of normal width/ base and the patient turned with 3 steps.  Toe and heel walk were deferred.  Deep tendon reflexes: in the  upper and lower extremities are symmetric and intact.  Babinski response was deferred .       After spending a total time of  50 minutes face to face and additional time for physical and neurologic examination, review of laboratory studies,  personal review  of imaging studies, reports and results of other testing and review of referral information / records as far as provided in visit, I have established the following assessments:  1) circadian rhythm- delayed sleep phase, sleeping in daytime.  OSA currently untreated -cannot tolerate CPAP FFM.  2) atrial fib, HTN, ventricular dilation, DVT, CVA 3) CVA related to factor V Leiden.    My Plan is to proceed with:  1) Adriana [name] is a 74 year old alert and fully oriented Caucasian right-handed female with a history of factor V Leiden, recurrent DVTs, CVA, atherosclerosis affecting coronary vessels carotid artery and aortic. She would be considered at high risk of sleep apnea and her previous sleep studies have placed her in a moderate category.  I would certainly want to be Adriana [name] a sleep study now maybe even use a split-night polysomnography protocol.  My goal is #1 to identify grade and eliminate obstructive sleep apnea, make sure that she does not have central sleep apnea, and I would promise her that we will not use a full facemask.  We should try and nasal pillows or nasal covering mask first given her experience with claustrophobia.  In addition she does have a significant circadian rhythm shift.  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  If necessary we should invite her for a daytime sleep study.  I am also willing to use melatonin or trazodone to help her go to sleep earlier and I would like her that as soon as she wakes up after sleep she should expose herself to daylight to make sure that she does remain alert and oriented and wakeful for the rest of the time.  A shift in circadian rhythm by an hour per week should be feasible which means that within the next 5 weeks I hope that she could advance her bedtime to somewhere around  midnight or even earlier.   I would like to thank [name], MD and [name], Md [address] [place],  [place] [address] for allowing me to meet with and to take care of this pleasant patient.   II plan to follow up either personally or through our NP within 3 month.   CC: I will share my notes with  PCP.  Electronically signed by: Melvyn Novas, MD 07/18/2020 10:02 AM  [facility] Board certified by  The [employer] of Sleep Medicine and Diplomate of the [employer] of Sleep Medicine. Board certified In Neurology through the [employer], Fellow of the [employer] of Neurology. Medical Director of [employer].

## 2020-07-18 NOTE — Patient Instructions (Signed)

## 2020-07-27 ENCOUNTER — Ambulatory Visit: Payer: Federal, State, Local not specified - PPO | Admitting: Cardiovascular Disease

## 2020-08-10 ENCOUNTER — Other Ambulatory Visit: Payer: Self-pay

## 2020-08-10 ENCOUNTER — Ambulatory Visit: Payer: Federal, State, Local not specified - PPO | Admitting: Cardiovascular Disease

## 2020-08-10 ENCOUNTER — Encounter: Payer: Self-pay | Admitting: Cardiovascular Disease

## 2020-08-10 VITALS — BP 116/62 | HR 72 | Ht 66.0 in | Wt 152.6 lb

## 2020-08-10 DIAGNOSIS — I10 Essential (primary) hypertension: Secondary | ICD-10-CM | POA: Diagnosis not present

## 2020-08-10 DIAGNOSIS — I2510 Atherosclerotic heart disease of native coronary artery without angina pectoris: Secondary | ICD-10-CM | POA: Diagnosis not present

## 2020-08-10 DIAGNOSIS — E782 Mixed hyperlipidemia: Secondary | ICD-10-CM | POA: Diagnosis not present

## 2020-08-10 DIAGNOSIS — I779 Disorder of arteries and arterioles, unspecified: Secondary | ICD-10-CM

## 2020-08-10 MED ORDER — ROSUVASTATIN CALCIUM 40 MG PO TABS
40.0000 mg | ORAL_TABLET | Freq: Every day | ORAL | 3 refills | Status: DC
Start: 2020-08-10 — End: 2021-08-14

## 2020-08-10 NOTE — Patient Instructions (Signed)
Medication Instructions:  1) STOP LIPITOR (atorvastatin) 2) START CRESTOR (rosuvastatin) 40 mg daily *If you need a refill on your cardiac medications before your next appointment, please call your pharmacy*  Lab Work: Your provider recommends that you return for FASTING lab work in: 3 months to recheck cholesterol   If you have labs (blood work) drawn today and your tests are completely normal, you will receive your results only by: Marland Kitchen MyChart Message (if you have MyChart) OR . A paper copy in the mail If you have any lab test that is abnormal or we need to change your treatment, we will call you to review the results.  Follow-Up: At [facility], you and your health needs are our priority.  As part of our continuing mission to provide you with exceptional heart care, we have created designated Provider Care Teams.  These Care Teams include your primary Cardiologist (physician) and Advanced Practice Providers (APPs -  Physician Assistants and Nurse Practitioners) who all work together to provide you with the care you need, when you need it. Your next appointment:   12 month(s) The format for your next appointment:   In Person Provider:   You may see [name], MD or one of the following Advanced Practice Providers on your designated Care Team:    Tereso Newcomer, PA-C  [place], [place]

## 2020-08-10 NOTE — Addendum Note (Signed)
Addended by: Gunnar Fusi A on: 08/10/2020 10:39 AM   Modules accepted: Orders

## 2020-08-10 NOTE — Progress Notes (Signed)
Cardiology Office Note:    Date:  08/10/2020   ID:  Adriana, [name] 1946-03-03, MRN [id number]  PCP:  [name], MD  [facility] Cardiologist:  [name], MD  [facility] Electrophysiologist:  None   Referring MD: [name], MD   Chief Complaint  Patient presents with  . Coronary Artery Disease    History of Present Illness:    Adriana [name] is a 74 y.o. female with a hx of CAD s/p CABG 11/2018 by Dr [name].   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  She was evaluated by Dr. Excell Seltzer in October 2019 for shortness of breath.  Her nuclear stress test was high risk and cardiac catheterization demonstrated significant three-vessel CAD.  She underwent CABG in December 2019.  Postoperative course was complicated by atrial fibrillation which was controlled by amiodarone.  She is here alone today. Other than lack of energy she is doing quite well. She has occasional heartburn, but otherwise no specific complaints.  No exertional chest pain or pressure.  No dyspnea, orthopnea, PND, or leg swelling.  No heart palpitations.  Past Medical History:  Diagnosis Date  . AKI (acute kidney injury) (HCC) 05/02/2018  . Carotid artery disease (HCC)    hx of bilat CEA // Carotid US 12/19: R 40-59; L 1-39  . Cellulitis of right leg 05/01/2018  . Coronary artery disease    Myoview 10/19 High Risk // LHC 11/19 w/ 3 v CAD // Echo 11/19: EF 50-55, gr 1 DD, ant-sept, inf-sept, apical HK // s/p CABG in 11/2018  . Depression    denies  . DVT (deep venous thrombosis) (HCC)    in setting of Factor V Leiden mutation  . Essential hypertension   . Factor V Leiden mutation (HCC)    Chronic Warfarin managed by PCP // hx of recurrent DVT; prior CVA  . GERD (gastroesophageal reflux  disease)   . History of kidney stones   . History of sepsis 05/01/2018  . History of stroke 2004   right side weakness-resolved  . HLD (hyperlipidemia)   . Myocardial infarction (HCC)   . Pneumonia    x2  . PONV (postoperative nausea and vomiting)    blood pressure up/down, confusion  . Poorly controlled type 2 diabetes mellitus (HCC)   . Seizures (HCC)   . Sleep apnea     Past Surgical History:  Procedure Laterality Date  . ABDOMINAL HYSTERECTOMY  1989  . BACK SURGERY    . CARDIAC CATHETERIZATION    . Carotid artery endarterectomy     right and left side with stents  . CATARACT EXTRACTION Bilateral 2017  . CHOLECYSTECTOMY    . CORONARY ARTERY BYPASS GRAFT N/A 11/17/2018   Procedure: CORONARY ARTERY BYPASS GRAFTING (CABG), ON PUMP, TIMES FIVE, USING LEFT INTERNAL MAMMARY ARTERY AND ENDOSCOPICALLY HARVESTED LEFT GREATER SAPHENOUS VEIN;  Surgeon: Alleen Borne, MD;  Location: [facility] OR;  Service: Open Heart Surgery;  Laterality: N/A;  . LEFT HEART CATH AND CORONARY ANGIOGRAPHY N/A 11/04/2018   Procedure: LEFT HEART CATH AND CORONARY ANGIOGRAPHY;  Surgeon: [name], MD;  Location: [facility] INVASIVE CV LAB;  Service: Cardiovascular;  Laterality: N/A;  . LITHOTRIPSY     x3  . TEE WITHOUT CARDIOVERSION N/A 11/17/2018   Procedure: TRANSESOPHAGEAL ECHOCARDIOGRAM (TEE);  Surgeon: Alleen Borne, MD;  Location:  [facility] OR;  Service: Open Heart Surgery;  Laterality: N/A;  . TONSILLECTOMY    . trigger fingers left and right Bilateral   . TUBAL LIGATION      Current Medications: Current Meds  Medication Sig  . acetaminophen (TYLENOL) 500 MG tablet Take 500 mg by mouth every 6 (six) hours as needed.  Marland Kitchen buPROPion (WELLBUTRIN XL) 300 MG 24 hr tablet Take 300 mg by mouth daily.   . chlorthalidone (HYGROTON) 25 MG tablet Take 25 mg by mouth daily.  . Cholecalciferol (VITAMIN D) 2000 units CAPS Take 2,000 Units by mouth daily.   . clobetasol cream (TEMOVATE) 0.05 % Apply 1 application topically  daily as needed (for psoriasis).   . Continuous Blood Gluc Sensor (FREESTYLE LIBRE 14 DAY SENSOR) MISC 1 each by Does not apply route every 14 (fourteen) days. Change every 2 weeks  . cyanocobalamin 2000 MCG tablet Take 2,000 mcg by mouth daily.  Marland Kitchen FARXIGA 10 MG TABS tablet Take 10 mg by mouth daily.  . Insulin Glargine (BASAGLAR KWIKPEN) 100 UNIT/ML Inject 24 Units into the skin at bedtime.  . insulin lispro (HUMALOG) 100 UNIT/ML injection Inject 0.06 mLs (6 Units total) into the skin 3 (three) times daily before meals. (Patient taking differently: Inject 12 Units into the skin 3 (three) times daily before meals. )  . lisinopril (PRINIVIL,ZESTRIL) 5 MG tablet Take 5 mg by mouth daily.  . metFORMIN (GLUCOPHAGE) 1000 MG tablet Take 1,000 mg by mouth 2 (two) times daily with a meal.  . metoprolol tartrate (LOPRESSOR) 25 MG tablet Take 0.5 tablets (12.5 mg total) by mouth 2 (two) times daily.  . potassium citrate (UROCIT-K) 10 MEQ (1080 MG) SR tablet Take 10 mEq by mouth 2 (two) times daily.  . promethazine (PHENERGAN) 25 MG tablet Take 12.5 mg by mouth every evening.   . warfarin (COUMADIN) 5 MG tablet Take by mouth See admin instructions. Take 5 mg every Monday and Friday; 2.5 mg all other days of the week  . white petrolatum (VASELINE) GEL Apply 1 application topically daily as needed (for psoriasis).  . [DISCONTINUED] atorvastatin (LIPITOR) 20 MG tablet Take 20 mg by mouth daily.     Allergies:   Aspirin and Propofol   Social History   Socioeconomic History  . Marital status: Married    Spouse name: [name]  . Number of children: Not on file  . Years of education: 37  . Highest education level: High school graduate  Occupational History  . Occupation: Retired  Tobacco Use  . Smoking status: Former Smoker    Packs/day: 1.00    Years: 38.00    Pack years: 38.00    Quit date: 2004    Years since quitting: 17.6  . Smokeless tobacco: Never Used  Vaping Use  . Vaping Use: Never used    Substance and Sexual Activity  . Alcohol use: Not Currently  . Drug use: Not Currently  . Sexual activity: Not on file  Other Topics Concern  . Not on file  Social History Narrative  . Not on file   Social Determinants of Health   Financial Resource Strain:   . Difficulty of Paying Living Expenses: Not on file  Food Insecurity:   . Worried About Programme researcher, broadcasting/film/video in the Last Year: Not on file  . Ran Out of Food in the Last Year: Not on file  Transportation Needs:   . Lack of Transportation (Medical): Not on file  . Lack of Transportation (Non-Medical):  Not on file  Physical Activity:   . Days of Exercise per Week: Not on file  . Minutes of Exercise per Session: Not on file  Stress:   . Feeling of Stress : Not on file  Social Connections:   . Frequency of Communication with Friends and Family: Not on file  . Frequency of Social Gatherings with Friends and Family: Not on file  . Attends Religious Services: Not on file  . Active Member of Clubs or Organizations: Not on file  . Attends BankerClub or Organization Meetings: Not on file  . Marital Status: Not on file     Family History: The patient's family history includes COPD in her brother; Cataracts in her maternal grandmother; Diabetes in her brother and maternal grandmother; Lung cancer in her father; Stroke in her mother. There is no history of Amblyopia, Blindness, Glaucoma, Hypertension, Macular degeneration, Retinal detachment, Strabismus, or Retinitis pigmentosa.  ROS:   Please see the history of present illness.    All other systems reviewed and are negative.  EKGs/Labs/Other Studies Reviewed:    The following studies were reviewed today: Stress Myoview scan 10/29/2018: Study Highlights   Nuclear stress EF: 40%.  There was no ST segment deviation noted during stress.  No T wave inversion was noted during stress.  Blood pressure demonstrated a normal response to exercise.  Defect 1: There is a medium defect  of moderate severity present in the basal inferoseptal, mid inferoseptal, apical septal and apical inferior location.  Defect 2: There is a medium defect of severe severity present in the mid anterior, mid anteroseptal, apical anterior, apical septal and apex location.  Findings consistent with prior myocardial infarction with peri-infarct ischemia in the anteroseptal, inferoseptal and apical regions.  This is a high risk study.  The left ventricular ejection fraction is moderately decreased (30-44%).  Echocardiogram 10/29/2018 Study Conclusions   - Left ventricle: The cavity size was normal. There was moderate  concentric hypertrophy. Systolic function was normal. The  estimated ejection fraction was in the range of 50% to 55%.  Doppler parameters are consistent with abnormal left ventricular  relaxation (grade 1 diastolic dysfunction). Acoustic contrast  opacification revealed no evidence ofthrombus.  - Regional wall motion abnormality: Hypokinesis of the mid  anteroseptal, mid inferoseptal, mid-apical inferior, apical  septal, and apical myocardium.  - Mitral valve: There was trivial regurgitation.  - Right ventricle: Systolic function was normal.  - Right atrium: There was a possible, small, fixed mass on the  right side of the interatrial septum. This is best seen on image  51. Consider myxoma.  - Atrial septum: The septum was thickened. A patent foramen ovale  cannot be excluded. There was a possiblemasson the right side of  the interatrial septum.  - Tricuspid valve: There was trivial regurgitation.   Impressions:   - 1. Low normal EF, but wall motion abnormalities noted.  Hypokinesis of mid to distal anteroseptum, inferoseptum, and  inferior walls as well as the apex. Echo contrast (definity) used  to better define endocardial borders and exclude LV thrombus)    2. Apical views suggest the presence of a small, fixed mass  adjacent to  the intra-atrial septum. Consider mxyoma.   Cardiac catheterization 11/04/2018: Conclusion    Prox RCA lesion is 100% stenosed.  Mid LM lesion is 30% stenosed.  There is mild left ventricular systolic dysfunction.  LV end diastolic pressure is mildly elevated.  The left ventricular ejection fraction is 50-55% by visual estimate.  Suezanne Jacquetst  1st Mrg to 1st Mrg lesion is 75% stenosed.  Ost LAD lesion is 90% stenosed.  Ost 1st Diag to 1st Diag lesion is 80% stenosed.  Prox LAD-1 lesion is 80% stenosed.  Prox LAD-2 lesion is 80% stenosed.   1. 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 Chronic subtotal occlusion of the LAD, filling antegrade but also supplied by right-left collaterals 3. Severe diffuse proximal and mid-LAD and first diagonal stenoses 4. Severe diffuse OM stenosis 5. Mild segmental LV systolic dysfunction  Recommend outpatient TCTS consultation for consideration of CABG in this patient with diabetes, LV dysfunction, and multivessel CAD  Carotid US: Summary:  Right Carotid: Velocities in the right ICA are consistent with a 40-59%         stenosis.   Left Carotid: Velocities in the left ICA are consistent with a 1-39%  stenosis.  Vertebrals: Bilateral vertebral arteries demonstrate antegrade flow.   Right ABI: Resting right ankle-brachial index is within normal range. No  evidence of significant right lower extremity arterial disease.  Left ABI: Resting left ankle-brachial index indicates mild left lower  extremity arterial disease.  Right Upper Extremity: Doppler waveforms remain within normal limits with  right radial compression. Doppler waveform obliterate with right ulnar  compression.  Left Upper Extremity: Doppler waveforms remain within normal limits with  left radial compression. Doppler waveform obliterate with left ulnar  compression.    EKG:  EKG is ordered today.  The ekg ordered  today demonstrates NSR 72 bpm, baseline wander, cannot rule out age-indeterminate MI  Recent Labs: No results found for requested labs within last 8760 hours.  Recent Lipid Panel    Component Value Date/Time   CHOL 83 05/05/2018 0316   TRIG 93 05/05/2018 0316   HDL 31 (L) 05/05/2018 0316   CHOLHDL 2.7 05/05/2018 0316   VLDL 19 05/05/2018 0316   LDLCALC 33 05/05/2018 0316    Physical Exam:    VS:  BP 116/62   Pulse 72   Ht  (1.676 m)   Wt 152 lb 9.6 oz (69.2 kg)   BMI 24.63 kg/m     Wt Readings from Last 3 Encounters:  08/10/20 152 lb 9.6 oz (69.2 kg)  07/18/20 153 lb (69.4 kg)  03/17/19 149 lb (67.6 kg)     GEN:  Well nourished, well developed in no acute distress HEENT: Normal NECK: No JVD; No carotid bruits LYMPHATICS: No lymphadenopathy CARDIAC: RRR, no murmurs, rubs, gallops RESPIRATORY:  Clear to auscultation without rales, wheezing or rhonchi  ABDOMEN: Soft, non-tender, non-distended MUSCULOSKELETAL:  No edema; No deformity  SKIN: Warm and dry NEUROLOGIC:  Alert and oriented x 3 PSYCHIATRIC:  Normal affect   ASSESSMENT:    1. Coronary artery disease involving native coronary artery of native heart without angina pectoris   2. Mixed hyperlipidemia   3. Essential hypertension   4. Bilateral carotid artery disease, unspecified type (HCC)    PLAN:    In order of problems listed above:  1. The patient is stable without symptoms of angina.  She is anticoagulated with chronic warfarin in the setting of factor V Leiden and is not prescribed antiplatelet therapy in the context of stable CAD on chronic anticoagulation.  She continues on a beta-blocker and statin drug (see below for further discussion). 2. LDL cholesterol 86 mg/dL.  Treated with atorvastatin 20 mg.  Discussed LDL goal less than 55 mg/dL.  Recommend change from atorvastatin to rosuvastatin 40 mg  with repeat lipids and LFTs in 3 months.  This can either be done here or at Dr. Rinaldo Cloud  office. 3. Blood pressure controlled on current medical therapy.  Medications reviewed.  Most recent labs reviewed included a creatinine of 1.2 mg/dL. 4. Pre-CABG Doppler studies reviewed.  Patient had 40 to 59% right ICA stenosis.  Continue medical therapy and clinical follow-up.  Consider repeat imaging in 2 to 3 years.   Medication Adjustments/Labs and Tests Ordered: Current medicines are reviewed at length with the patient today.  Concerns regarding medicines are outlined above.  Orders Placed This Encounter  Procedures  . EKG 12-Lead   Meds ordered this encounter  Medications  . rosuvastatin (CRESTOR) 40 MG tablet    Sig: Take 1 tablet (40 mg total) by mouth daily.    Dispense:  90 tablet    Refill:  3    Patient Instructions  Medication Instructions:  1) STOP LIPITOR (atorvastatin) 2) START CRESTOR (rosuvastatin) 40 mg daily *If you need a refill on your cardiac medications before your next appointment, please call your pharmacy*  Lab Work: Your provider recommends that you return for FASTING lab work in: 3 months to recheck cholesterol   If you have labs (blood work) drawn today and your tests are completely normal, you will receive your results only by: Marland Kitchen MyChart Message (if you have MyChart) OR . A paper copy in the mail If you have any lab test that is abnormal or we need to change your treatment, we will call you to review the results.  Follow-Up: At [facility], you and your health needs are our priority.  As part of our continuing mission to provide you with exceptional heart care, we have created designated Provider Care Teams.  These Care Teams include your primary Cardiologist (physician) and Advanced Practice Providers (APPs -  Physician Assistants and Nurse Practitioners) who all work together to provide you with the care you need, when you need it. Your next appointment:   12 month(s) The format for your next appointment:   In Person Provider:   You may  see [name], MD or one of the following Advanced Practice Providers on your designated Care Team:    Tereso Newcomer, PA-C  Chelsea Aus, [place]      Signed, [name], MD  08/10/2020 10:33 AM    [facility]

## 2020-09-01 ENCOUNTER — Ambulatory Visit (INDEPENDENT_AMBULATORY_CARE_PROVIDER_SITE_OTHER): Payer: Federal, State, Local not specified - PPO | Admitting: Neurology

## 2020-09-01 ENCOUNTER — Other Ambulatory Visit: Payer: Self-pay

## 2020-09-01 DIAGNOSIS — G4733 Obstructive sleep apnea (adult) (pediatric): Secondary | ICD-10-CM

## 2020-09-01 DIAGNOSIS — I4891 Unspecified atrial fibrillation: Secondary | ICD-10-CM

## 2020-09-01 DIAGNOSIS — I6300 Cerebral infarction due to thrombosis of unspecified precerebral artery: Secondary | ICD-10-CM

## 2020-09-01 DIAGNOSIS — I6523 Occlusion and stenosis of bilateral carotid arteries: Secondary | ICD-10-CM

## 2020-09-01 DIAGNOSIS — G4727 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: Secondary | ICD-10-CM

## 2020-09-15 DIAGNOSIS — I4891 Unspecified atrial fibrillation: Secondary | ICD-10-CM | POA: Insufficient documentation

## 2020-09-15 NOTE — Procedures (Signed)
PATIENT'S NAME:  Adriana [name], Adriana [name] DOB:      09-01-1946      MR#:    [id number]     DATE OF RECORDING: 09/01/2020 MR REFERRING M.D.:  [name], MD Study Performed:   Baseline Polysomnogram HISTORY:  Adriana [name] is a 74 year- old Caucasian female patient seen here upon a referral for a sleep consultation on 07/18/2020 from Dr Evlyn Kanner.  Chief concern according to patient :  "I will need a new sleep evaluation to get a CPAP and interface that works for me".    I have the pleasure of seeing Adriana [name] , a right -handed Caucasian female with a medical history of AKI (acute kidney injury) (HCC) (05/02/2018), Patient has a history of coronary artery disease a status post CABG, "carotid artery disease stenosis aortic atherosclerosis, diastolic dysfunction atrial fibrillation, paroxysmal, hypertension hyperlipidemia she has a history of stroke since 2004 and felt that this interfered the most with her sleep circadian rhythm.  Carotid artery disease (HCC), Cellulitis of right leg (05/01/2018), Coronary artery disease, Depression, DVT (deep venous thrombosis) (HCC), Essential hypertension, Factor V Leiden mutation (HCC), GERD (gastroesophageal reflux disease), kidney stones, History of sepsis (05/01/2018), History of stroke (2004), HLD (hyperlipidemia), Myocardial infarction (HCC), Pneumonia, PONV (postoperative nausea and vomiting), Poorly controlled type 2 diabetes mellitus (HCC), Seizures (HCC), and Sleep apnea. She suffers from kidney stones.    Her sleep study from 2011 is summarized here: The patient's sleep study analysis from [facility] was reviewed - study performed on 27 July 2010.  The patient had moderate obstructive sleep apnea with an AHI of 23/h in all sleep positions. in sleep stage REM, she reached severe apnea at 35/h. There were a total of 111 events.  Oxygen nadir was 81%.  There was minimal tachycardia noted snoring was moderate sleep  efficiency was rather poor at 64%.  This was signed by Dr. Tildon Husky, MD.   He recommended CPAP titration at the time for which I have no documentation.  The patient endorsed the Epworth Sleepiness Scale at 4 points.   The patient's weight 153 pounds with a height of 66 (inches), resulting in a BMI of 24.4 kg/m2. The patient's neck circumference measured 17 inches.  CURRENT MEDICATIONS: Wellbutrin, Vitamin D, Temovate, Cyanocobalamin, Humalog, Zestril, Urocit-K, Coumadin, Hygroton, Glucophage, Lopressor, Phenergan   PROCEDURE:  This is a multichannel digital polysomnogram utilizing the Somnostar 11.2 system.  Electrodes and sensors were applied and monitored per AASM Specifications.   EEG, EOG, Chin and Limb EMG, were sampled at 200 Hz.  ECG, Snore and Nasal Pressure, Thermal Airflow, Respiratory Effort, CPAP Flow and Pressure, Oximetry was sampled at 50 Hz. Digital video and audio were recorded.      BASELINE STUDY:Lights Out was at 22:08 and Lights On at 04:50.  Total recording time (TRT) was 403 minutes, with a total sleep time (TST) of 274.5 minutes.   The patient's sleep latency was 40 minutes.  REM latency was 258 minutes.  The sleep efficiency was 68.1 %.     SLEEP ARCHITECTURE: WASO (Wake after sleep onset) was 95.5 minutes.  There were 14.5 minutes in Stage N1, 210.5 minutes Stage N2, 4 minutes Stage N3 and 45.5 minutes in Stage REM.  The percentage of Stage N1 was 5.3%, Stage N2 was 76.7%, Stage N3 was 1.5% and Stage R (REM sleep) was 16.6%.     RESPIRATORY ANALYSIS:  There were a total of 85 respiratory events:  37 obstructive apneas,  0 central apneas and 0 mixed apneas with a total of 37 apneas and an apnea index (AI) of 8.1 /hour. There were 48 hypopneas with a hypopnea index of 10.5 /hour.      The total APNEA/HYPOPNEA INDEX (AHI) was 18.6/hour.  0 events occurred in REM sleep and 96 events in NREM. The REM AHI was  0.0 /hour, versus a non-REM AHI of 22.3. The patient spent 0 minutes of  total sleep time in the supine position and 275 minutes in non-supine. The supine AHI was 0.0 versus a non-supine AHI of 18.6.  OXYGEN SATURATION & C02:  The Wake baseline 02 saturation was 90%, with the lowest being 83%.  Time spent below 89% saturation equaled 41 minutes.  The arousals were noted as: 42 were spontaneous, 1 were associated with PLMs, 63 were associated with respiratory events. The patient had a total of 5 Periodic Limb Movements.  The Periodic Limb Movement (PLM) Arousal index was 0.2/hour.  Audio and video analysis did not show any abnormal or unusual movements, behaviors, phonations or vocalizations.  The patient took a bathroom break. Snoring was noted. EKG was in keeping with normal sinus rhythm (NSR).  IMPRESSION:  1. Moderate degree of Obstructive Sleep Apnea (OSA) at AHI of 18.6/h with prolonged hypoxemia, and all non-supine sleep. remarkably, REM sleep apnea was not found, all events took place in NREM sleep, which is a hallmark of CSA.  2. Insignificant degree of Periodic Limb Movement Disorder (PLMD) 3. Primary Snoring  RECOMMENDATIONS:  1. Advise full night, attended, CPAP titration study to optimize therapy for apnea and hypoxemia 2. If insurance does not permit this, I will need to use auto CPAP at 5-15 cm water, 2 cm EPR and mask of choice , heated humidification.    I certify that I have reviewed the entire raw data recording prior to the issuance of this report in accordance with the Standards of Accreditation of the American Academy of Sleep Medicine (AASM)  Melvyn Novas, MD Diplomat, American Board of Psychiatry and Neurology  Diplomat, American Board of Sleep Medicine Wellsite geologist, [facility] [employer]

## 2020-09-15 NOTE — Addendum Note (Signed)
Addended by: Melvyn Novas on: 09/15/2020 04:57 PM   Modules accepted: Orders

## 2020-09-15 NOTE — Progress Notes (Signed)
IMPRESSION:  1. Moderate degree of Obstructive Sleep Apnea (OSA) at AHI of 18.6/h with prolonged hypoxemia, and all non-supine sleep. remarkably, REM sleep apnea was not found, all events took place in NREM sleep, which is a hallmark of CSA.  2. Insignificant degree of Periodic Limb Movement Disorder (PLMD) 3. Primary Snoring  RECOMMENDATIONS:  1. Advise full night, attended, CPAP titration study to optimize therapy for apnea and hypoxemia 2. If insurance does not permit this, I will need to use auto CPAP at 5-15 cm water, 2 cm EPR and mask of choice , heated humidification.

## 2020-09-21 ENCOUNTER — Telehealth: Payer: Self-pay | Admitting: Neurology

## 2020-09-21 NOTE — Telephone Encounter (Signed)
-----   Message from Melvyn Novas, MD sent at 09/15/2020  4:57 PM EDT ----- IMPRESSION:  1. Moderate degree of Obstructive Sleep Apnea (OSA) at AHI of 18.6/h with prolonged hypoxemia, and all non-supine sleep. remarkably, REM sleep apnea was not found, all events took place in NREM sleep, which is a hallmark of CSA.  2. Insignificant degree of Periodic Limb Movement Disorder (PLMD) 3. Primary Snoring  RECOMMENDATIONS:  1. Advise full night, attended, CPAP titration study to optimize therapy for apnea and hypoxemia 2. If insurance does not permit this, I will need to use auto CPAP at 5-15 cm water, 2 cm EPR and mask of choice , heated humidification.

## 2020-09-21 NOTE — Telephone Encounter (Signed)
Called patient to discuss sleep study results. No answer at this time. LVM for the patient to call back.   

## 2020-09-21 NOTE — Telephone Encounter (Signed)
Pt returned call and I advised pt that Dr. Vickey Huger reviewed their sleep study results and found that has sleep apnea and recommends that pt be treated with a cpap. Dr. Vickey Huger recommends that pt return for a repeat sleep study in order to properly titrate the cpap and ensure a good mask fit. Pt is agreeable to returning for a titration study. I advised pt that our sleep lab will file with pt's insurance and call pt to schedule the sleep study when we hear back from the pt's insurance regarding coverage of this sleep study. Pt verbalized understanding of results. Pt had no questions at this time but was encouraged to call back if questions arise.

## 2020-10-27 ENCOUNTER — Telehealth: Payer: Self-pay | Admitting: Neurology

## 2020-10-27 NOTE — Telephone Encounter (Signed)
Pt called wanting to know why she had not been called to schedule her cpap study that was ordered on 09/15/20. When I looked into it the cpap order had been sent to the wrong location therefore, the sleep lab never received the order. The sleep lab manager called and spoke with the pt. The pt verbalized understanding. The sleep lab manager submitted auth to [facility] we are waiting to hear back from insurance. Pt is scheduled for cpap study on 11/14/20.

## 2020-11-07 ENCOUNTER — Other Ambulatory Visit: Payer: Federal, State, Local not specified - PPO

## 2020-11-14 ENCOUNTER — Other Ambulatory Visit: Payer: Self-pay

## 2020-11-14 ENCOUNTER — Other Ambulatory Visit: Payer: Federal, State, Local not specified - PPO | Admitting: *Deleted

## 2020-11-14 ENCOUNTER — Ambulatory Visit (INDEPENDENT_AMBULATORY_CARE_PROVIDER_SITE_OTHER): Payer: Federal, State, Local not specified - PPO | Admitting: Neurology

## 2020-11-14 DIAGNOSIS — E782 Mixed hyperlipidemia: Secondary | ICD-10-CM

## 2020-11-14 DIAGNOSIS — I6523 Occlusion and stenosis of bilateral carotid arteries: Secondary | ICD-10-CM

## 2020-11-14 DIAGNOSIS — G4733 Obstructive sleep apnea (adult) (pediatric): Secondary | ICD-10-CM | POA: Diagnosis not present

## 2020-11-14 DIAGNOSIS — G4727 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: Secondary | ICD-10-CM

## 2020-11-14 DIAGNOSIS — I6300 Cerebral infarction due to thrombosis of unspecified precerebral artery: Secondary | ICD-10-CM

## 2020-11-14 DIAGNOSIS — I4891 Unspecified atrial fibrillation: Secondary | ICD-10-CM

## 2020-11-15 LAB — HEPATIC FUNCTION PANEL
ALT: 22 IU/L (ref 0–32)
AST: 15 IU/L (ref 0–40)
Albumin: 4.2 g/dL (ref 3.7–4.7)
Alkaline Phosphatase: 112 IU/L (ref 44–121)
Bilirubin Total: 0.3 mg/dL (ref 0.0–1.2)
Bilirubin, Direct: 0.11 mg/dL (ref 0.00–0.40)
Total Protein: 7.3 g/dL (ref 6.0–8.5)

## 2020-11-15 LAB — LIPID PANEL
Chol/HDL Ratio: 2.6 ratio (ref 0.0–4.4)
Cholesterol, Total: 123 mg/dL (ref 100–199)
HDL: 47 mg/dL (ref >39)
LDL Chol Calc (NIH): 50 mg/dL (ref 0–99)
Triglycerides: 154 mg/dL — ABNORMAL HIGH (ref 0–149)
VLDL Cholesterol Cal: 26 mg/dL (ref 5–40)

## 2020-11-19 NOTE — Progress Notes (Signed)
DIAGNOSIS  1. Obstructive Sleep Apnea with NREM dependence, responding well  to low CPAP pressures.  2. Some residual Sleep Related Hypoxemia was present, but the  time in hypoxia was cut by 50%, sleep improved.  3. A cluster of Periodic Limb Movements was noted between 3.20  and 4.20 AM, there were only few related arousals.    PLANS/RECOMMENDATIONS:  Auto CPAP device with a pressure setting between 4-10 cm water,  and with 1 cm EPR, heated humidification and a small ResMed  AirFit N30i nasal mask.  This worked well for her as she slept on her right side all  night. There was some oral air leak, but it did not cause  arousals.   DISCUSSION: A follow up appointment will be scheduled with our NP  in the Sleep Clinic at [facility].  Please  call [phone] with any questions.

## 2020-11-19 NOTE — Progress Notes (Signed)
DIAGNOSIS  1. Obstructive Sleep Apnea with NREM dependence, responding well  to low CPAP pressures.  2. Some residual Sleep Related Hypoxemia was present, but the  time in hypoxia was cut by 50%, sleep improved.  3. A cluster of Periodic Limb Movements was noted between 3.20  and 4.20 AM, there were only few related arousals.    PLANS/RECOMMENDATIONS:  Auto CPAP device with a pressure setting between 4-10 cm water,  and with 1 cm EPR, heated humidification and a small ResMed  AirFit N30i nasal mask.  This worked well for her as she slept on her right side all  night. There was some oral air leak, but it did not cause  arousals.   DISCUSSION: A follow up appointment will be scheduled with our NP  in the Sleep Clinic at [facility].  Please  call [phone] with any questions.

## 2020-11-19 NOTE — Procedures (Signed)
PATIENT'S NAME:  Adriana [name], Adriana [name] DOB:      [birth year]      MR#:    [id number]     DATE OF RECORDING: 11/14/2020 A. Damaris Hippo M.D.:  [name], MD Study Performed:   Titration to positive airway pressure HISTORY:  Ms. Overacker has returned to [facility] sleep lab for a CPAP titration following the previous PSG performed on 09/01/20 which resulted in a diagnosis of mild -moderate sleep apnea, with AHI of 18.6/h, and a nadir SpO2 of 83%. No REM sleep apnea, but severe and prolonged hypoxemia with a total duration of 41 minutes. Some PVCs noted. PLMs noted.   The patient had endorsed the Epworth Sleepiness Scale at 4/24 points.   The patient's weight 152 pounds with a height of 66 (inches), resulting in a BMI of 24.4 kg/m2. The patient's neck circumference measured 17 inches.  CURRENT MEDICATIONS: Wellbutrin, Vitamin D, Temovate, Cyanocobalamin, Humalog, Zestril, Uracil-K, Coumadin, Hygroton, Glucophage, Lopressor, Phenergan    PROCEDURE:  This is a multichannel digital polysomnogram utilizing the SomnoStar 11.2 system.  Electrodes and sensors were applied and monitored per AASM Specifications.   EEG, EOG, Chin and Limb EMG, were sampled at 200 Hz.  ECG, Snore and Nasal Pressure, Thermal Airflow, Respiratory Effort, CPAP Flow and Pressure, Oximetry was sampled at 50 Hz. Digital video and audio were recorded.       CPAP was initiated under a N30 ResMed nasal mask in size small ( N 30 i) at 5 cmH20 with heated humidity per AASM split night standards and pressure was advanced to 7cmH20 because of hypopneas, apneas and desaturations.  At a PAP pressure of 7 cmH20, there was a reduction of the AHI to 0.0 with improvement of sleep apnea. The technologist used BiLevel to ease the patient into sleep after she was unable to initiate sleep on CPAP.   Lights Out was at 22:44 and Lights On at 04:59. Total recording time (TRT) was 376 minutes, with a total sleep time (TST) of 298.5 minutes. The  patient's sleep latency was 60.5 minutes. REM latency was 140 minutes.  The sleep efficiency was 79.4 %.    SLEEP ARCHITECTURE: WASO (Wake after sleep onset) was 19.5 minutes.  There were 24.5 minutes in Stage N1, 25.5 minutes Stage N2, 194.5 minutes Stage N3 and 54 minutes in Stage REM.  The percentage of Stage N1 was 8.2%, Stage N2 was 8.5%, Stage N3 was 65.2% and Stage R (REM sleep) was 18.1%. The sleep architecture was notable for a single long REM sleep episode at 6 cm water CPAP pressure, right lateral position.   RESPIRATORY ANALYSIS:  There was a total of 2 respiratory events: 2 hypopneas with a hypopnea index of 0.4/hour. The patient also had no additional respiratory event related arousals (RERAs).      The total APNEA/HYPOPNEA INDEX  (AHI) was 0.4 /hour and the total RESPIRATORY DISTURBANCE INDEX was .4 /hour  2 events occurred in REM sleep and 0 events in NREM. The REM AHI was 2.2 /hour versus a non-REM AHI of 0.0 /hour.  The patient spent 0 minutes of total sleep time in the supine position and 299 minutes in non-supine. The supine AHI was 0.0, versus a non-supine AHI of 0.4.  OXYGEN SATURATION & C02:  The baseline 02 saturation was 92%, with the lowest being 84%. Time spent below 89% saturation equaled 20 minutes. The arousals were noted as: 47 were spontaneous, 6 were associated with PLMs, 0 were associated with respiratory events.  The patient had a total of 117 Periodic Limb Movements. The Periodic Limb Movement (PLM) Arousal index was 1.2 /hour. All PLMs clustered between 3.20 AM and 4.20 AM under the last explored CPAP pressure (why?).  All sleep was recorded in right lateral position.   Audio and video analysis did not show any abnormal or unusual movements, behaviors, phonations or vocalizations.   EKG was in keeping with normal sinus rhythm (NSR), isolated PVCs.  DIAGNOSIS 1. Obstructive Sleep Apnea with NREM dependence, responding well to low CPAP pressures.  2. Some residual  Sleep Related Hypoxemia was present, but the time in hypoxia was cut by 50%, sleep improved.  3. A cluster of Periodic Limb Movements was noted between 3.20 and 4.20 AM, there were only few related arousals.    PLANS/RECOMMENDATIONS: Auto CPAP device with a pressure setting between  4-9 cm water, and with 1 cm EPR, heated humidification and a small ResMed AirFit N30i nasal mask.  This worked well for her as she slept on her right side all night. There was some oral air leak, but it did not cause arousals.   DISCUSSION: A follow up appointment will be scheduled with our NP in the Sleep Clinic at [facility].   Please call [phone] with any questions.      I certify that I have reviewed the entire raw data recording prior to the issuance of this report in accordance with the Standards of Accreditation of the American Academy of Sleep Medicine (AASM)   Melvyn Novas, M.D. Diplomat, Biomedical engineer of Neurology  Diplomat, Biomedical engineer of Sleep Medicine Wellsite geologist, [employer] [facility] [employer]

## 2020-11-19 NOTE — Addendum Note (Signed)
Addended by: Melvyn Novas on: 11/19/2020 07:07 PM   Modules accepted: Orders

## 2020-11-22 ENCOUNTER — Telehealth: Payer: Self-pay | Admitting: Neurology

## 2020-11-22 NOTE — Telephone Encounter (Signed)
-----   Message from Melvyn Novas, MD sent at 11/19/2020  7:06 PM EST ----- DIAGNOSIS  1. Obstructive Sleep Apnea with NREM dependence, responding well  to low CPAP pressures.  2. Some residual Sleep Related Hypoxemia was present, but the  time in hypoxia was cut by 50%, sleep improved.  3. A cluster of Periodic Limb Movements was noted between 3.20  and 4.20 AM, there were only few related arousals.    PLANS/RECOMMENDATIONS:  Auto CPAP device with a pressure setting between 4-10 cm water,  and with 1 cm EPR, heated humidification and a small ResMed  AirFit N30i nasal mask.  This worked well for her as she slept on her right side all  night. There was some oral air leak, but it did not cause  arousals.   DISCUSSION: A follow up appointment will be scheduled with our NP  in the Sleep Clinic at [facility].  Please  call [phone] with any questions.

## 2020-11-22 NOTE — Telephone Encounter (Signed)
Called patient to discuss sleep study results. No answer at this time. LVM for the patient to call back.   

## 2020-11-24 NOTE — Telephone Encounter (Signed)
Made 2nd attempt to call. I advised pt that Dr. Vickey Huger reviewed their sleep study results and found that pt has sleep apnea. Dr. Vickey Huger recommends that pt starts auto CPAP. I reviewed PAP compliance expectations with the pt. Pt is agreeable to starting a CPAP. I advised pt that an order will be sent to a DME, [facility] and [facility]  will call the pt within about one week after they file with the pt's insurance. [facility])  will show the pt how to use the machine, fit for masks, and troubleshoot the CPAP if needed. A follow up appt will need to be made for insurance purposes with Dr. Vickey Huger or NP. Pt verbalized understanding to call our office and schedule the initial CPAP visit 31-90 days from the date she picks the machine up. A letter with all of this information in it will be mailed to the pt as a reminder. I verified with the pt that the address we have on file is correct. Pt verbalized understanding of results. Pt had no questions at this time but was encouraged to call back if questions arise. I have sent the order to [facility]) and have received confirmation that they have received the order.

## 2020-12-19 ENCOUNTER — Ambulatory Visit: Payer: Federal, State, Local not specified - PPO | Admitting: Cardiovascular Disease

## 2021-05-02 ENCOUNTER — Ambulatory Visit (INDEPENDENT_AMBULATORY_CARE_PROVIDER_SITE_OTHER): Payer: Federal, State, Local not specified - PPO | Admitting: Podiatry

## 2021-05-02 ENCOUNTER — Encounter: Payer: Self-pay | Admitting: Podiatry

## 2021-05-02 ENCOUNTER — Other Ambulatory Visit: Payer: Self-pay

## 2021-05-02 DIAGNOSIS — M79609 Pain in unspecified limb: Secondary | ICD-10-CM

## 2021-05-02 DIAGNOSIS — L600 Ingrowing nail: Secondary | ICD-10-CM

## 2021-05-02 DIAGNOSIS — B351 Tinea unguium: Secondary | ICD-10-CM | POA: Diagnosis not present

## 2021-05-02 DIAGNOSIS — Z7901 Long term (current) use of anticoagulants: Secondary | ICD-10-CM | POA: Diagnosis not present

## 2021-05-07 NOTE — Progress Notes (Signed)
Subjective:   Patient ID: Adriana [name], female   DOB: 75 y.o.   MRN: [id number]   HPI 75 year old female presents the office today for concerns of toenail issues.  She states the nails become elongated and get ingrown causing discomfort.  Denies any swelling or redness or any drainage to the toenail sites.  She does have neuropathy.  Recently she broke her left leg about 8 weeks ago and is still wearing a cam boot.  She is treated by orthopedics for this.  No other concerns today.   Review of Systems  All other systems reviewed and are negative.  Past Medical History:  Diagnosis Date  . AKI (acute kidney injury) (HCC) 05/02/2018  . Carotid artery disease (HCC)    hx of bilat CEA // Carotid US 12/19: R 40-59; L 1-39  . Cellulitis of right leg 05/01/2018  . Coronary artery disease    Myoview 10/19 High Risk // LHC 11/19 w/ 3 v CAD // Echo 11/19: EF 50-55, gr 1 DD, ant-sept, inf-sept, apical HK // s/p CABG in 11/2018  . Depression    denies  . DVT (deep venous thrombosis) (HCC)    in setting of Factor V Leiden mutation  . Essential hypertension   . Factor V Leiden mutation (HCC)    Chronic Warfarin managed by PCP // hx of recurrent DVT; prior CVA  . GERD (gastroesophageal reflux disease)   . History of kidney stones   . History of sepsis 05/01/2018  . History of stroke 2004   right side weakness-resolved  . HLD (hyperlipidemia)   . Myocardial infarction (HCC)   . Pneumonia    x2  . PONV (postoperative nausea and vomiting)    blood pressure up/down, confusion  . Poorly controlled type 2 diabetes mellitus (HCC)   . Seizures (HCC)   . Sleep apnea     Past Surgical History:  Procedure Laterality Date  . ABDOMINAL HYSTERECTOMY  1989  . BACK SURGERY    . CARDIAC CATHETERIZATION    . Carotid artery endarterectomy     right and left side with stents  . CATARACT EXTRACTION Bilateral 2017  . CHOLECYSTECTOMY    . CORONARY ARTERY BYPASS GRAFT N/A 11/17/2018   Procedure:  CORONARY ARTERY BYPASS GRAFTING (CABG), ON PUMP, TIMES FIVE, USING LEFT INTERNAL MAMMARY ARTERY AND ENDOSCOPICALLY HARVESTED LEFT GREATER SAPHENOUS VEIN;  Surgeon: Alleen Borne, MD;  Location: [facility] OR;  Service: Open Heart Surgery;  Laterality: N/A;  . LEFT HEART CATH AND CORONARY ANGIOGRAPHY N/A 11/04/2018   Procedure: LEFT HEART CATH AND CORONARY ANGIOGRAPHY;  Surgeon: [name], MD;  Location: [facility] INVASIVE CV LAB;  Service: Cardiovascular;  Laterality: N/A;  . LITHOTRIPSY     x3  . TEE WITHOUT CARDIOVERSION N/A 11/17/2018   Procedure: TRANSESOPHAGEAL ECHOCARDIOGRAM (TEE);  Surgeon: Alleen Borne, MD;  Location: [facility] OR;  Service: Open Heart Surgery;  Laterality: N/A;  . TONSILLECTOMY    . trigger fingers left and right Bilateral   . TUBAL LIGATION       Current Outpatient Medications:  .  PANTOPRAZOLE SODIUM PO, Take by mouth., Disp: , Rfl:  .  ACCU-CHEK GUIDE test strip, CHECK BLOOD SUGAR THREE TIMES DAILY, Disp: , Rfl:  .  acetaminophen (TYLENOL) 500 MG tablet, Take 500 mg by mouth every 6 (six) hours as needed., Disp: , Rfl:  .  atorvastatin (LIPITOR) 20 MG tablet, Take by mouth., Disp: , Rfl:  .  Benzonatate 150 MG CAPS, Take 1 capsule  by mouth 3 (three) times daily., Disp: , Rfl:  .  buPROPion (WELLBUTRIN XL) 300 MG 24 hr tablet, Take 300 mg by mouth daily. , Disp: , Rfl: 2 .  cefdinir (OMNICEF) 300 MG capsule, Take 300 mg by mouth 2 (two) times daily., Disp: , Rfl:  .  chlorthalidone (HYGROTON) 25 MG tablet, Take 25 mg by mouth daily., Disp: , Rfl:  .  Cholecalciferol (VITAMIN D) 2000 units CAPS, Take 2,000 Units by mouth daily. , Disp: , Rfl:  .  clobetasol cream (TEMOVATE) 0.05 %, Apply 1 application topically daily as needed (for psoriasis). , Disp: , Rfl:  .  Continuous Blood Gluc Sensor (FREESTYLE LIBRE 14 DAY SENSOR) MISC, 1 each by Does not apply route every 14 (fourteen) days. Change every 2 weeks, Disp: 2 each, Rfl: 11 .  cyanocobalamin 2000 MCG tablet, Take 2,000 mcg  by mouth daily., Disp: , Rfl:  .  diclofenac Sodium (VOLTAREN) 1 % GEL, Apply topically., Disp: , Rfl:  .  ergocalciferol (VITAMIN D2) 1.25 MG (50000 UT) capsule, Take by mouth., Disp: , Rfl:  .  FARXIGA 10 MG TABS tablet, Take 10 mg by mouth daily., Disp: , Rfl:  .  Insulin Glargine (BASAGLAR KWIKPEN) 100 UNIT/ML, Inject 24 Units into the skin at bedtime., Disp: , Rfl:  .  insulin lispro (HUMALOG) 100 UNIT/ML injection, Inject 0.06 mLs (6 Units total) into the skin 3 (three) times daily before meals. (Patient taking differently: Inject 12 Units into the skin 3 (three) times daily before meals. ), Disp: 10 mL, Rfl: 11 .  lisinopril (PRINIVIL,ZESTRIL) 5 MG tablet, Take 5 mg by mouth daily., Disp: , Rfl:  .  metFORMIN (GLUCOPHAGE) 1000 MG tablet, Take 1,000 mg by mouth 2 (two) times daily with a meal., Disp: , Rfl:  .  metoprolol tartrate (LOPRESSOR) 25 MG tablet, Take 0.5 tablets (12.5 mg total) by mouth 2 (two) times daily., Disp: 90 tablet, Rfl: 3 .  potassium citrate (UROCIT-K) 10 MEQ (1080 MG) SR tablet, Take 10 mEq by mouth 2 (two) times daily., Disp: , Rfl:  .  promethazine (PHENERGAN) 25 MG tablet, Take 12.5 mg by mouth every evening. , Disp: , Rfl: 1 .  rosuvastatin (CRESTOR) 40 MG tablet, Take 1 tablet (40 mg total) by mouth daily., Disp: 90 tablet, Rfl: 3 .  warfarin (COUMADIN) 5 MG tablet, Take by mouth See admin instructions. Take 5 mg every Monday and Friday; 2.5 mg all other days of the week, Disp: , Rfl:  .  white petrolatum (VASELINE) GEL, Apply 1 application topically daily as needed (for psoriasis)., Disp: , Rfl:   Allergies  Allergen Reactions  . Aspirin Nausea And Vomiting  . Propofol Nausea And Vomiting    Confused BP goes up and down.  . Propofol Nausea And Vomiting    Confused BP goes up and down.  . Ibandronic Acid   . Sulfa Antibiotics     Other reaction(s): nausea          Objective:  Physical Exam  General: AAO x3, NAD  Dermatological: Nails are healthy  hypertrophic, dystrophic, brittle, yellow discoloration, elongated 10.  Induration present to the left second, right second toenail on the lateral aspects without any edema, erythema.  No surrounding redness or drainage. Tenderness nails 1-5 bilaterally. No open lesions or pre-ulcerative lesions are identified today.  Vascular: Dorsalis Pedis artery and Posterior Tibial artery pedal pulses are 2/4 bilateral with immedate capillary fill time. There is no pain with calf compression,  swelling, warmth, erythema.   Neruologic: Grossly intact via light touch bilateral.   Musculoskeletal:  Muscular strength 5/5 in all groups tested on the right side.  Did not test on the left side given the fracture.     Assessment:   Symptomatic onychomycosis/ingrown toenail, currently on Coumadin     Plan:  -Treatment options discussed including all alternatives, risks, and complications -Etiology of symptoms were discussed -Nails debrided 10 without complications or bleeding. -Discussed partial nail avulsion but decided to hold off on this today. -Daily foot inspection -Follow-up in 3 months or sooner if any problems arise. In the meantime, encouraged to call the office with any questions, concerns, change in symptoms.   Ovid Curd, DPM

## 2021-05-19 ENCOUNTER — Ambulatory Visit (HOSPITAL_COMMUNITY): Payer: Federal, State, Local not specified - PPO

## 2021-06-01 NOTE — Progress Notes (Signed)
[facility] - Clinic Note  06/05/2021     CHIEF COMPLAINT Patient presents for Retina Follow Up   HISTORY OF PRESENT ILLNESS: Adriana [name] is a 75 y.o. female who presents to the clinic today for:   HPI     Retina Follow Up   Patient presents with  Other.  In both eyes.  Duration of 12 months.  Since onset it is stable.  I, the attending physician,  performed the HPI with the patient and updated documentation appropriately.        Comments   Pt here for 12 mo retinal follow up DM w/o retinopathy. Pt states vision is normal, no changes or issues reported. Most recent A1C was 9.1. Pt is now wearing glucose monitor. BS was last checked at 114.       Last edited by Rennis Chris, MD on 06/05/2021  4:27 PM.    Pt states vision is stable, pts last A1c was 9.1, so she is now wearing a glucose monitor   Referring physician: [name], MD [address] [place],  [place] [address]  HISTORICAL INFORMATION:   Selected notes from the MEDICAL RECORD NUMBER Referred by Ronney Lion, NP for DM exam LEE:  Ocular Hx- PMH-depression, HTN, high cholesterol, stroke, DM (taking metformin)    CURRENT MEDICATIONS: No current outpatient medications on file. (Ophthalmic Drugs)   No current facility-administered medications for this visit. (Ophthalmic Drugs)   Current Outpatient Medications (Other)  Medication Sig   ACCU-CHEK GUIDE test strip CHECK BLOOD SUGAR THREE TIMES DAILY   acetaminophen (TYLENOL) 500 MG tablet Take 500 mg by mouth every 6 (six) hours as needed.   atorvastatin (LIPITOR) 20 MG tablet Take by mouth.   Benzonatate 150 MG CAPS Take 1 capsule by mouth 3 (three) times daily.   buPROPion (WELLBUTRIN XL) 300 MG 24 hr tablet Take 300 mg by mouth daily.    cefdinir (OMNICEF) 300 MG capsule Take 300 mg by mouth 2 (two) times daily.   chlorthalidone (HYGROTON) 25 MG tablet Take 25 mg by mouth daily.   Cholecalciferol (VITAMIN D) 2000  units CAPS Take 2,000 Units by mouth daily.    clobetasol cream (TEMOVATE) 0.05 % Apply 1 application topically daily as needed (for psoriasis).    Continuous Blood Gluc Sensor (FREESTYLE LIBRE 14 DAY SENSOR) MISC 1 each by Does not apply route every 14 (fourteen) days. Change every 2 weeks   cyanocobalamin 2000 MCG tablet Take 2,000 mcg by mouth daily.   diclofenac Sodium (VOLTAREN) 1 % GEL Apply topically.   ergocalciferol (VITAMIN D2) 1.25 MG (50000 UT) capsule Take by mouth.   FARXIGA 10 MG TABS tablet Take 10 mg by mouth daily.   Insulin Glargine (BASAGLAR KWIKPEN) 100 UNIT/ML Inject 24 Units into the skin at bedtime.   insulin lispro (HUMALOG) 100 UNIT/ML injection Inject 0.06 mLs (6 Units total) into the skin 3 (three) times daily before meals. (Patient taking differently: Inject 12 Units into the skin 3 (three) times daily before meals. )   lisinopril (PRINIVIL,ZESTRIL) 5 MG tablet Take 5 mg by mouth daily.   metFORMIN (GLUCOPHAGE) 1000 MG tablet Take 1,000 mg by mouth 2 (two) times daily with a meal.   metoprolol tartrate (LOPRESSOR) 25 MG tablet Take 0.5 tablets (12.5 mg total) by mouth 2 (two) times daily.   PANTOPRAZOLE SODIUM PO Take by mouth.   potassium citrate (UROCIT-K) 10 MEQ (1080 MG) SR tablet Take 10 mEq by mouth 2 (two) times  daily.   promethazine (PHENERGAN) 25 MG tablet Take 12.5 mg by mouth every evening.    rosuvastatin (CRESTOR) 40 MG tablet Take 1 tablet (40 mg total) by mouth daily.   warfarin (COUMADIN) 5 MG tablet Take by mouth See admin instructions. Take 5 mg every Monday and Friday; 2.5 mg all other days of the week   white petrolatum (VASELINE) GEL Apply 1 application topically daily as needed (for psoriasis).   No current facility-administered medications for this visit. (Other)      REVIEW OF SYSTEMS: ROS   Positive for: Endocrine, Cardiovascular, Eyes, Psychiatric Negative for: Constitutional, Gastrointestinal, Neurological, Skin, Genitourinary,  Musculoskeletal, HENT, Respiratory, Allergic/Imm, Heme/Lymph Last edited by Thompson GrayerSimpson, Makenzie E, COT on 06/05/2021  3:11 PM.        ALLERGIES Allergies  Allergen Reactions   Aspirin Nausea And Vomiting   Propofol Nausea And Vomiting    Confused BP goes up and down.   Propofol Nausea And Vomiting    Confused BP goes up and down.   Ibandronic Acid    Sulfa Antibiotics     Other reaction(s): nausea    PAST MEDICAL HISTORY Past Medical History:  Diagnosis Date   AKI (acute kidney injury) (HCC) 05/02/2018   Carotid artery disease (HCC)    hx of bilat CEA // Carotid US 12/19: R 40-59; L 1-39   Cellulitis of right leg 05/01/2018   Coronary artery disease    Myoview 10/19 High Risk // LHC 11/19 w/ 3 v CAD // Echo 11/19: EF 50-55, gr 1 DD, ant-sept, inf-sept, apical HK // s/p CABG in 11/2018   Depression    denies   DVT (deep venous thrombosis) (HCC)    in setting of Factor V Leiden mutation   Essential hypertension    Factor V Leiden mutation (HCC)    Chronic Warfarin managed by PCP // hx of recurrent DVT; prior CVA   GERD (gastroesophageal reflux disease)    History of kidney stones    History of sepsis 05/01/2018   History of stroke 2004   right side weakness-resolved   HLD (hyperlipidemia)    Myocardial infarction (HCC)    Pneumonia    x2   PONV (postoperative nausea and vomiting)    blood pressure up/down, confusion   Poorly controlled type 2 diabetes mellitus (HCC)    Seizures (HCC)    Sleep apnea    Past Surgical History:  Procedure Laterality Date   ABDOMINAL HYSTERECTOMY  1989   BACK SURGERY     CARDIAC CATHETERIZATION     Carotid artery endarterectomy     right and left side with stents   CATARACT EXTRACTION Bilateral 2017   CHOLECYSTECTOMY     CORONARY ARTERY BYPASS GRAFT N/A 11/17/2018   Procedure: CORONARY ARTERY BYPASS GRAFTING (CABG), ON PUMP, TIMES FIVE, USING LEFT INTERNAL MAMMARY ARTERY AND ENDOSCOPICALLY HARVESTED LEFT GREATER SAPHENOUS VEIN;   Surgeon: Alleen BorneBartle, Bryan K, MD;  Location: [facility] OR;  Service: Open Heart Surgery;  Laterality: N/A;   LEFT HEART CATH AND CORONARY ANGIOGRAPHY N/A 11/04/2018   Procedure: LEFT HEART CATH AND CORONARY ANGIOGRAPHY;  Surgeon: Tonny Bollmanooper, Michael, MD;  [facility];  Service: Cardiovascular;  Laterality: N/A;   LITHOTRIPSY     x3   TEE WITHOUT CARDIOVERSION N/A 11/17/2018   Procedure: TRANSESOPHAGEAL ECHOCARDIOGRAM (TEE);  Surgeon: Alleen BorneBartle, Bryan K, MD;  [facility];  Service: Open Heart Surgery;  Laterality: N/A;   TONSILLECTOMY     trigger fingers left and right  Bilateral    TUBAL LIGATION      FAMILY HISTORY Family History  Problem Relation Age of Onset   Stroke Mother    Lung cancer Father    COPD Brother    Diabetes Brother    Cataracts Maternal Grandmother    Diabetes Maternal Grandmother    Amblyopia Neg Hx    Blindness Neg Hx    Glaucoma Neg Hx    Hypertension Neg Hx    Macular degeneration Neg Hx    Retinal detachment Neg Hx    Strabismus Neg Hx    Retinitis pigmentosa Neg Hx     SOCIAL HISTORY Social History   Tobacco Use   Smoking status: Former    Packs/day: 1.00    Years: 38.00    Pack years: 38.00    Types: Cigarettes    Quit date: 2004    Years since quitting: 18.4   Smokeless tobacco: Never  Vaping Use   Vaping Use: Never used  Substance Use Topics   Alcohol use: Not Currently   Drug use: Not Currently         OPHTHALMIC EXAM:  Base Eye Exam     Visual Acuity (Snellen - Linear)       Right Left   Dist [place] 20/20 -2 20/25 +2         Tonometry (Tonopen, 3:18 PM)       Right Left   Pressure 15 19         Pupils       Dark Light Shape React APD   Right 3 2 Round Brisk None   Left 3 2 Round Brisk None         Visual Fields (Counting fingers)       Left Right    Full Full         Extraocular Movement       Right Left    Full, Ortho Full, Ortho         Neuro/Psych     Oriented x3: Yes   Mood/Affect:  Normal         Dilation     Both eyes: 1.0% Mydriacyl, 2.5% Phenylephrine @ 3:19 PM           Slit Lamp and Fundus Exam     Slit Lamp Exam       Right Left   Lids/Lashes Dermatochalasis - upper lid, Meibomian gland dysfunction; inspissated meibomian gland nasal upper lid Dermatochalasis - upper lid, Meibomian gland dysfunction   Conjunctiva/Sclera White and quiet White and quiet   Cornea Well healed cataract wounds, sub epi scar at 1200, Arcus Well healed cataract wounds, Arcus, trace tear film debris   Anterior Chamber Deep and quiet Deep and quiet   Iris Round and dilated, No NVI Round and dilated, No NVI   Lens Tecnis multifocal PC IOL in good position Tecnis multifocal PC IOL in good position   Vitreous Vitreous syneresis, Posterior vitreous detachment Vitreous syneresis         Fundus Exam       Right Left   Disc Pink and Sharp Pink and Sharp   C/D Ratio 0.5 0.4   Macula Blunted foveal reflex, retinal drusen, Retinal pigment epithelial mottling and clumping, No heme or edema Blunted foveal reflex, Epiretinal membrane, drusen, Retinal pigment epithelial mottling and clumping, no heme or edema   Vessels attenuated, mild tortuousity Vascular attenuation   Periphery Attached, inferior paving stone, chorioretinal atrophy at 1030, +reticular degeneration,  no heme, mild peripheral drusen Attached, scattered paving stone / chorioretinal atrophy, +reticular degeneration, no heme, mild peripheral drusen            IMAGING AND PROCEDURES  Imaging and Procedures for @TODAY @  OCT, Retina - OU - Both Eyes       Right Eye Quality was good. Central Foveal Thickness: 266. Progression has been stable. Findings include normal foveal contour, no IRF, no SRF, retinal drusen .   Left Eye Quality was good. Central Foveal Thickness: 283. Progression has been stable. Findings include retinal drusen , no IRF, no SRF, epiretinal membrane, macular pucker, normal foveal contour (Mild  ERM and pucker -- stable).   Notes *Images captured and stored on drive  Diagnosis / Impression:  OD: NFP, No IRF/SRF OS: Mild ERM and pucker -- stable  Clinical management:  See below  Abbreviations: NFP - Normal foveal profile. CME - cystoid macular edema. PED - pigment epithelial detachment. IRF - intraretinal fluid. SRF - subretinal fluid. EZ - ellipsoid zone. ERM - epiretinal membrane. ORA - outer retinal atrophy. ORT - outer retinal tubulation. SRHM - subretinal hyper-reflective material                ASSESSMENT/PLAN:    ICD-10-CM   1. Diabetes mellitus type 2 without retinopathy (HCC)  E11.9     2. Epiretinal membrane (ERM) of left eye  H35.372     3. Early dry stage nonexudative age-related macular degeneration of both eyes  H35.3131     4. Retinal edema  H35.81 OCT, Retina - OU - Both Eyes    5. Pseudophakia of both eyes  Z96.1       1. Diabetes mellitus, type 2 without retinopathy  - pt self reports A1c of 9.1 - The incidence, risk factors for progression, natural history and treatment options for diabetic retinopathy  were discussed with patient.   - The need for close monitoring of blood glucose, blood pressure, and serum lipids, avoiding cigarette or any type of tobacco, and the need for long term follow up was also discussed with patient. - f/u in 1 year, sooner prn  2. Epiretinal membrane OS -- stable - BCVA  20/25 OS -- stable - OCT shows mild ERM and pucker -- stable - not visually significant -- no surgical intervention indicated at this time - recommend monitoring - F/U 1 yr, sooner prn -- DFE, OCT  3. Age related macular degeneration, non-exudative, both eyes  - The incidence, anatomy, and pathology of dry AMD, risk of progression, and the AREDS and AREDS 2 study including smoking risks discussed with patient.  - Recommend amsler grid monitoring  - f/u 1 yr   4. No retinal edema on exam or OCT  5. Pseudophakia OU  - s/p CE/IOL OU --  Tecnis multifocal IOLs by Dr. 04-09-2002  - beautiful surgeries, doing well  - monitor  Ophthalmic Meds Ordered this visit:  No orders of the defined types were placed in this encounter.      Return in about 1 year (around 06/05/2022) for f/u DM exam, DFE, OCT.  There are no Patient Instructions on file for this visit.  This document serves as a record of services personally performed by 06/07/2022, MD, PhD. It was created on their behalf by Karie Chimera, COA, an ophthalmic technician. The creation of this record is the provider's dictation and/or activities during the visit.    Electronically signed by: Herby Abraham, COA @TODAY @ 4:29 PM  This document serves as a record of services personally performed by Karie Chimera, MD, PhD. It was created on their behalf by Glee Arvin. Manson Passey, OA an ophthalmic technician. The creation of this record is the provider's dictation and/or activities during the visit.    Electronically signed by: Glee Arvin. Manson Passey, [place] 06.20.2022 4:29 PM  Karie Chimera, M.D., Ph.D. Diseases & Surgery of the Retina and Vitreous [facility] 06/05/2021    I have reviewed the above documentation for accuracy and completeness, and I agree with the above. Karie Chimera, M.D., Ph.D. 06/05/21 4:29 PM  Abbreviations: M myopia (nearsighted); A astigmatism; H hyperopia (farsighted); P presbyopia; Mrx spectacle prescription;  CTL contact lenses; OD right eye; OS left eye; OU both eyes  XT exotropia; ET esotropia; PEK punctate epithelial keratitis; PEE punctate epithelial erosions; DES dry eye syndrome; MGD meibomian gland dysfunction; ATs artificial tears; PFAT's preservative free artificial tears; NSC nuclear sclerotic cataract; PSC posterior subcapsular cataract; ERM epi-retinal membrane; PVD posterior vitreous detachment; RD retinal detachment; DM diabetes mellitus; DR diabetic retinopathy; NPDR non-proliferative diabetic retinopathy; PDR  proliferative diabetic retinopathy; CSME clinically significant macular edema; DME diabetic macular edema; dbh dot blot hemorrhages; CWS cotton wool spot; POAG primary open angle glaucoma; C/D cup-to-disc ratio; HVF humphrey visual field; GVF goldmann visual field; OCT optical coherence tomography; IOP intraocular pressure; BRVO Branch retinal vein occlusion; CRVO central retinal vein occlusion; CRAO central retinal artery occlusion; BRAO branch retinal artery occlusion; RT retinal tear; SB scleral buckle; PPV pars plana vitrectomy; VH Vitreous hemorrhage; PRP panretinal laser photocoagulation; IVK intravitreal kenalog; VMT vitreomacular traction; MH Macular hole;  NVD neovascularization of the disc; NVE neovascularization elsewhere; AREDS age related eye disease study; ARMD age related macular degeneration; POAG primary open angle glaucoma; EBMD epithelial/anterior basement membrane dystrophy; ACIOL anterior chamber intraocular lens; IOL intraocular lens; PCIOL posterior chamber intraocular lens; Phaco/IOL phacoemulsification with intraocular lens placement; PRK photorefractive keratectomy; LASIK laser assisted in situ keratomileusis; HTN hypertension; DM diabetes mellitus; COPD chronic obstructive pulmonary disease

## 2021-06-02 ENCOUNTER — Encounter (INDEPENDENT_AMBULATORY_CARE_PROVIDER_SITE_OTHER): Payer: Federal, State, Local not specified - PPO | Admitting: Ophthalmology

## 2021-06-05 ENCOUNTER — Encounter (INDEPENDENT_AMBULATORY_CARE_PROVIDER_SITE_OTHER): Payer: Self-pay | Admitting: Ophthalmology

## 2021-06-05 ENCOUNTER — Ambulatory Visit (INDEPENDENT_AMBULATORY_CARE_PROVIDER_SITE_OTHER): Payer: Federal, State, Local not specified - PPO | Admitting: Ophthalmology

## 2021-06-05 ENCOUNTER — Other Ambulatory Visit: Payer: Self-pay

## 2021-06-05 DIAGNOSIS — E119 Type 2 diabetes mellitus without complications: Secondary | ICD-10-CM

## 2021-06-05 DIAGNOSIS — Z961 Presence of intraocular lens: Secondary | ICD-10-CM

## 2021-06-05 DIAGNOSIS — H353131 Nonexudative age-related macular degeneration, bilateral, early dry stage: Secondary | ICD-10-CM

## 2021-06-05 DIAGNOSIS — H3581 Retinal edema: Secondary | ICD-10-CM

## 2021-06-05 DIAGNOSIS — H35372 Puckering of macula, left eye: Secondary | ICD-10-CM

## 2021-07-03 ENCOUNTER — Other Ambulatory Visit: Payer: Self-pay

## 2021-07-03 ENCOUNTER — Encounter: Payer: Medicare Other | Admitting: Podiatry

## 2021-07-10 NOTE — Progress Notes (Signed)
Unfortunately running behind schedule due to a patient emergency. Patient left without being seen.

## 2021-07-25 ENCOUNTER — Other Ambulatory Visit (HOSPITAL_COMMUNITY): Payer: Self-pay | Admitting: *Deleted

## 2021-07-26 ENCOUNTER — Encounter (HOSPITAL_COMMUNITY): Payer: Self-pay

## 2021-07-26 ENCOUNTER — Inpatient Hospital Stay (HOSPITAL_COMMUNITY): Admission: RE | Admit: 2021-07-26 | Payer: Federal, State, Local not specified - PPO | Source: Ambulatory Visit

## 2021-08-12 ENCOUNTER — Other Ambulatory Visit: Payer: Self-pay | Admitting: Cardiovascular Disease

## 2021-08-18 ENCOUNTER — Ambulatory Visit (HOSPITAL_COMMUNITY)
Admission: RE | Admit: 2021-08-18 | Discharge: 2021-08-18 | Disposition: A | Discharge status: Home / Self Care | Payer: Federal, State, Local not specified - PPO | Source: Ambulatory Visit | Attending: Endocrinology | Admitting: Endocrinology

## 2021-08-18 DIAGNOSIS — M810 Age-related osteoporosis without current pathological fracture: Secondary | ICD-10-CM | POA: Diagnosis not present

## 2021-08-18 MED ORDER — ZOLEDRONIC ACID 5 MG/100ML IV SOLN
INTRAVENOUS | Status: AC
  Administered 2021-08-18: 5 mg via INTRAVENOUS
  Filled 2021-08-18: qty 100

## 2021-08-18 MED ORDER — ZOLEDRONIC ACID 5 MG/100ML IV SOLN: 5.0000 mg | Freq: Once | INTRAVENOUS | Status: AC

## 2021-10-17 NOTE — Telephone Encounter (Signed)
Received an email from the DME company that they have attempted to contact the patient.   "we have made several attempts to get patient scheduled for PAP setup without success. Voiding order."

## 2021-12-22 ENCOUNTER — Other Ambulatory Visit: Payer: Self-pay | Admitting: Endocrinology

## 2021-12-22 DIAGNOSIS — R0989 Other specified symptoms and signs involving the circulatory and respiratory systems: Secondary | ICD-10-CM

## 2022-01-15 ENCOUNTER — Other Ambulatory Visit: Payer: Self-pay

## 2022-01-15 ENCOUNTER — Ambulatory Visit
Admission: RE | Admit: 2022-01-15 | Discharge: 2022-01-15 | Disposition: A | Discharge status: Home / Self Care | Payer: Federal, State, Local not specified - PPO | Source: Ambulatory Visit | Attending: Endocrinology | Admitting: Endocrinology

## 2022-01-15 DIAGNOSIS — R0989 Other specified symptoms and signs involving the circulatory and respiratory systems: Secondary | ICD-10-CM

## 2022-01-15 MED ORDER — IOPAMIDOL (ISOVUE-300) INJECTION 61%
80.0000 mL | Freq: Once | INTRAVENOUS | Status: AC | PRN
  Administered 2022-01-15: 80 mL via INTRAVENOUS

## 2022-01-17 ENCOUNTER — Encounter: Payer: Self-pay | Admitting: Physician Assistant

## 2022-01-17 ENCOUNTER — Encounter: Payer: Self-pay | Admitting: Cardiovascular Disease

## 2022-01-17 ENCOUNTER — Ambulatory Visit: Payer: Federal, State, Local not specified - PPO | Admitting: Cardiovascular Disease

## 2022-01-17 ENCOUNTER — Other Ambulatory Visit: Payer: Self-pay

## 2022-01-17 VITALS — BP 120/70 | HR 82 | Ht 66.0 in | Wt 145.8 lb

## 2022-01-17 DIAGNOSIS — I2510 Atherosclerotic heart disease of native coronary artery without angina pectoris: Secondary | ICD-10-CM

## 2022-01-17 DIAGNOSIS — E782 Mixed hyperlipidemia: Secondary | ICD-10-CM

## 2022-01-17 DIAGNOSIS — I10 Essential (primary) hypertension: Secondary | ICD-10-CM | POA: Diagnosis not present

## 2022-01-17 DIAGNOSIS — I779 Disorder of arteries and arterioles, unspecified: Secondary | ICD-10-CM | POA: Diagnosis not present

## 2022-01-17 NOTE — Progress Notes (Signed)
Cardiology Office Note:    Date:  01/17/2022   ID:  Adriana, [name] Mar 02, 1946, MRN [id number]  PCP:  Adriana Bowen, MD   [place] Providers Cardiologist:  Sherren Mocha, MD     Referring MD: Adriana Bowen, MD   No chief complaint on file.   History of Present Illness:    Adriana [name] is a 76 y.o. female with a hx of CAD s/p CABG 11/2018 by Dr Adriana [name].    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  Her nuclear stress test was high risk and cardiac catheterization demonstrated significant three-vessel CAD.  She underwent CABG in 2019. Her anginal equivalent is 'heartburn' which she hasn't experienced since undergoing CABG. The patient is doing very well. She doesn't exercise but has no symptoms at her level of activity. She reports no recent medication changes. BP and diabetes are well-controlled by her report.   Past Medical History:  Diagnosis Date   AKI (acute kidney injury) ([place]) 05/02/2018   Carotid artery disease ([place])    hx of bilat CEA // Carotid US 12/19: R 40-59; L 1-39   Cellulitis of right leg 05/01/2018   Coronary artery disease    Myoview 10/19 High Risk // LHC 11/19 w/ 3 v CAD // Echo 11/19: EF 50-55, gr 1 DD, ant-sept, inf-sept, apical HK // s/p CABG in 11/2018   Depression    denies   DVT (deep venous thrombosis) (HCC)    in setting of Factor V Leiden mutation   Essential hypertension    Factor V Leiden mutation ([place])    Chronic Warfarin managed by PCP // hx of recurrent DVT; prior CVA   GERD (gastroesophageal reflux disease)    History of kidney stones    History of sepsis 05/01/2018   History of stroke 2004   right side weakness-resolved   HLD (hyperlipidemia)    Myocardial infarction (HCC)    Pneumonia    x2   PONV (postoperative  nausea and vomiting)    blood pressure up/down, confusion   Poorly controlled type 2 diabetes mellitus (HCC)    Seizures ([place])    Sleep apnea     Past Surgical History:  Procedure Laterality Date   ABDOMINAL HYSTERECTOMY  1989   BACK SURGERY     CARDIAC CATHETERIZATION     Carotid artery endarterectomy     right and left side with stents   CATARACT EXTRACTION Bilateral 2017   CHOLECYSTECTOMY     CORONARY ARTERY BYPASS GRAFT N/A 11/17/2018   Procedure: CORONARY ARTERY BYPASS GRAFTING (CABG), ON PUMP, TIMES FIVE, USING LEFT INTERNAL MAMMARY ARTERY AND ENDOSCOPICALLY HARVESTED LEFT GREATER SAPHENOUS VEIN;  Surgeon: Gaye Pollack, MD;  Location: [facility];  Service: Open Heart Surgery;  Laterality: N/A;   LEFT HEART CATH AND CORONARY ANGIOGRAPHY N/A 11/04/2018   Procedure: LEFT HEART CATH AND CORONARY ANGIOGRAPHY;  Surgeon: Sherren Mocha, MD;  Location: [facility];  Service: Cardiovascular;  Laterality: N/A;   LITHOTRIPSY     x3   TEE WITHOUT CARDIOVERSION N/A 11/17/2018   Procedure: TRANSESOPHAGEAL ECHOCARDIOGRAM (TEE);  Surgeon: Gaye Pollack, MD;  Location: [facility];  Service: Open Heart Surgery;  Laterality: N/A;   TONSILLECTOMY     trigger fingers left and right Bilateral    TUBAL LIGATION      Current Medications:  Current Meds  Medication Sig   ACCU-CHEK GUIDE test strip CHECK BLOOD SUGAR THREE TIMES DAILY   acetaminophen (TYLENOL) 500 MG tablet Take 500 mg by mouth every 6 (six) hours as needed.   buPROPion (WELLBUTRIN XL) 300 MG 24 hr tablet Take 300 mg by mouth daily.    chlorthalidone (HYGROTON) 25 MG tablet Take 25 mg by mouth daily.   Cholecalciferol (VITAMIN D) 2000 units CAPS Take 2,000 Units by mouth daily.    clobetasol cream (TEMOVATE) [id number] % Apply 1 application topically daily as needed (for psoriasis).    Continuous Blood Gluc Sensor (FREESTYLE LIBRE 14 DAY SENSOR) MISC 1 each by Does not apply route every 14 (fourteen) days. Change every 2 weeks    cyanocobalamin 2000 MCG tablet Take 2,000 mcg by mouth daily.   ergocalciferol (VITAMIN D2) 1.25 MG (50000 UT) capsule Take by mouth.   Insulin Glargine (BASAGLAR KWIKPEN) 100 UNIT/ML Inject 24 Units into the skin at bedtime.   insulin lispro (HUMALOG) 100 UNIT/ML injection Inject 0.06 mLs (6 Units total) into the skin 3 (three) times daily before meals. (Patient taking differently: Inject 12 Units into the skin 3 (three) times daily before meals.)   lisinopril (PRINIVIL,ZESTRIL) 5 MG tablet Take 5 mg by mouth daily.   metFORMIN (GLUCOPHAGE) 1000 MG tablet Take 1,000 mg by mouth 2 (two) times daily with a meal.   metoprolol tartrate (LOPRESSOR) 25 MG tablet Take 0.5 tablets (12.5 mg total) by mouth 2 (two) times daily.   potassium citrate (UROCIT-K) 10 MEQ (1080 MG) SR tablet Take 10 mEq by mouth 2 (two) times daily.   promethazine (PHENERGAN) 25 MG tablet Take 12.5 mg by mouth every evening.    rosuvastatin (CRESTOR) 40 MG tablet TAKE 1 TABLET(40 MG) BY MOUTH DAILY   warfarin (COUMADIN) 5 MG tablet Take by mouth See admin instructions. Take 5 mg every Monday and Friday; 2.5 mg all other days of the week   white petrolatum (VASELINE) GEL Apply 1 application topically daily as needed (for psoriasis).     Allergies:   Aspirin, Propofol, Propofol, Ibandronic acid, Sulfa antibiotics, and Trulicity [dulaglutide]   Social History   Socioeconomic History   Marital status: Married    Spouse name: [name]   Number of children: Not on file   Years of education: 12   Highest education level: High school graduate  Occupational History   Occupation: Retired  Tobacco Use   Smoking status: Former    Packs/day: 1.00    Years: 38.00    Pack years: 38.00    Types: Cigarettes    Quit date: 2004    Years since quitting: 19.0   Smokeless tobacco: Never  Vaping Use   Vaping Use: Never used  Substance and Sexual Activity   Alcohol use: Not Currently   Drug use: Not Currently   Sexual activity: Not on  file  Other Topics Concern   Not on file  Social History Narrative   Not on file   Social Determinants of Health   Financial Resource Strain: Not on file  Food Insecurity: Not on file  Transportation Needs: Not on file  Physical Activity: Not on file  Stress: Not on file  Social Connections: Not on file     Family History: The patient's family history includes COPD in her brother; Cataracts in her maternal grandmother; Diabetes in her brother and maternal grandmother; Lung cancer in her father; Stroke in her mother. There is no history of Amblyopia, Blindness, Glaucoma, Hypertension,  Macular degeneration, Retinal detachment, Strabismus, or Retinitis pigmentosa.  ROS:   Please see the history of present illness.    All other systems reviewed and are negative.  EKGs/Labs/Other Studies Reviewed:    EKG:  EKG is not ordered today.   Recent Labs: No results found for requested labs within last 8760 hours.  Recent Lipid Panel    Component Value Date/Time   CHOL 123 11/14/2020 1614   TRIG 154 (H) 11/14/2020 1614   HDL 47 11/14/2020 1614   CHOLHDL 2.6 11/14/2020 1614   CHOLHDL 2.7 05/05/2018 0316   VLDL 19 05/05/2018 0316   LDLCALC 50 11/14/2020 1614     Risk Assessment/Calculations:           Physical Exam:    VS:  BP 120/70    Pulse 82    Ht 5\' 6"  (1.676 m)    Wt 145 lb 12.8 oz (66.1 kg)    SpO2 96%    BMI 23.53 kg/m     Wt Readings from Last 3 Encounters:  01/17/22 145 lb 12.8 oz (66.1 kg)  08/10/20 152 lb 9.6 oz (69.2 kg)  07/18/20 153 lb (69.4 kg)     GEN:  Well nourished, well developed in no acute distress HEENT: Normal NECK: No JVD; No carotid bruits LYMPHATICS: No lymphadenopathy CARDIAC: RRR, no murmurs, rubs, gallops RESPIRATORY:  Clear to auscultation without rales, wheezing or rhonchi  ABDOMEN: Soft, non-tender, non-distended MUSCULOSKELETAL:  No edema; No deformity  SKIN: Warm and dry NEUROLOGIC:  Alert and oriented x 3 PSYCHIATRIC:  Normal  affect   ASSESSMENT:    1. Bilateral carotid artery disease, unspecified type ([place])   2. Coronary artery disease involving native coronary artery of native heart without angina pectoris   3. Mixed hyperlipidemia   4. Essential hypertension    PLAN:    In order of problems listed above:  40-59% RICA stenosis and [id number] LICA stenosis by carotid US in 2019. Needs follow-up evaluation. Continue current medical therapy. No angina. Pt s/p CABG in 2019. Risk reduction program reviewed and appropriate with warfarin, no ASA, on high-intensity statin drug, on ACE inhibitor in setting of diabetes and CAD. LDL 42 on rosuvastatin 40 mg daily BP well-controlled on lisinopril and metoprolol    Overall the patient seems to be doing very well.  I will plan to see her back in 1 year for clinical follow-up.  Medication Adjustments/Labs and Tests Ordered: Current medicines are reviewed at length with the patient today.  Concerns regarding medicines are outlined above.  Orders Placed This Encounter  Procedures   VAS US CAROTID   No orders of the defined types were placed in this encounter.   Patient Instructions  Medication Instructions:  Your physician recommends that you continue on your current medications as directed. Please refer to the Current Medication list given to you today.  *If you need a refill on your cardiac medications before your next appointment, please call your pharmacy*   Lab Work: none If you have labs (blood work) drawn today and your tests are completely normal, you will receive your results only by: [place] (if you have MyChart) OR A paper copy in the mail If you have any lab test that is abnormal or we need to change your treatment, we will call you to review the results.   Testing/Procedures: Carotid Ultrasound Your physician has requested that you have a carotid duplex. This test is an ultrasound of the carotid arteries in your neck. It  looks at blood flow  through these arteries that supply the brain with blood. Allow one hour for this exam. There are no restrictions or special instructions.    Follow-Up: At [facility], you and your health needs are our priority.  As part of our continuing mission to provide you with exceptional heart care, we have created designated Provider Care Teams.  These Care Teams include your primary Cardiologist (physician) and Advanced Practice Providers (APPs -  Physician Assistants and Nurse Practitioners) who all work together to provide you with the care you need, when you need it.   Your next appointment:   1 year(s)  The format for your next appointment:   In Person  Provider:   Sherren Mocha, MD     Other Instructions Thank you for allowing myself and [facility] to serve you, God Bless!!     Signed, Sherren Mocha, MD  01/17/2022 5:44 PM    [place]

## 2022-01-17 NOTE — Patient Instructions (Signed)
Medication Instructions:  Your physician recommends that you continue on your current medications as directed. Please refer to the Current Medication list given to you today.  *If you need a refill on your cardiac medications before your next appointment, please call your pharmacy*   Lab Work: none If you have labs (blood work) drawn today and your tests are completely normal, you will receive your results only by: MyChart Message (if you have MyChart) OR A paper copy in the mail If you have any lab test that is abnormal or we need to change your treatment, we will call you to review the results.   Testing/Procedures: Carotid Ultrasound Your physician has requested that you have a carotid duplex. This test is an ultrasound of the carotid arteries in your neck. It looks at blood flow through these arteries that supply the brain with blood. Allow one hour for this exam. There are no restrictions or special instructions.    Follow-Up: At [facility], you and your health needs are our priority.  As part of our continuing mission to provide you with exceptional heart care, we have created designated Provider Care Teams.  These Care Teams include your primary Cardiologist (physician) and Advanced Practice Providers (APPs -  Physician Assistants and Nurse Practitioners) who all work together to provide you with the care you need, when you need it.   Your next appointment:   1 year(s)  The format for your next appointment:   In Person  Provider:   [name], MD     Other Instructions Thank you for allowing myself and [facility] to serve you, God Bless!!

## 2022-01-25 NOTE — Progress Notes (Signed)
01/26/2022 Adriana [name] [id number] 1946-12-10   ASSESSMENT AND PLAN:   Diarrhea, unspecified type Normal CT scan, no anemia, no weight loss. Multiple antibiotics recently, will get C. difficile. With insulin-dependent diabetes, stool worse after food, will get pancreatic elastase, will get celiac though lower risk. Can consider Colestid for bile acid diarrhea since patient's had cholecystectomy but with multiple medications patient prefers to stay on Pepto-Bismol for now because this does help. No new medications but patient is on Wellbutrin as well as metformin 1000 mg twice daily, can consider decreasing metformin, decreasing Wellbutrin dose. If everything is negative may have to consider endoscopic evaluation for microscopic colitis but patient is high risk so we will do this evaluation first.  Follow-up 2 months, patient requesting Dr. Orvan Falconer. -     CBC with Differential/Platelet; Future -     Comprehensive metabolic panel; Future -     IgA; Future -     Tissue Transglutaminase Abs,IgG,IgA; Future -     Pancreatic elastase, fecal; Future -     Clostridium difficile Toxin B, Qualitative, Real-Time PCR; Future  Diverticulosis of colon with hemorrhage Add on fiber  Gastroesophageal reflux disease without esophagitis -     H. pylori antigen, stool; Future -     pantoprazole (PROTONIX) 40 MG tablet; Take 1 tablet (40 mg total) by mouth daily before breakfast. -We will get H. pylori stool, start back on pantoprazole, patient was taken off of this we will only do once a day for possibility of microscopic colitis.  Due to all of the comorbidities listed below, recurrent DVTs, CVA, coronary artery disease, patient is very high risk for endoscopic procedures and stopping Coumadin, will proceed with laboratory evaluations first and decide later whether that risk is needed.  Factor V Leiden mutation [facility]) Atrial fibrillation, transient (HCC) Cerebrovascular accident (CVA)  due to thrombosis of precerebral artery (HCC) Coronary artery disease, unspecified vessel or lesion type, unspecified whether angina present, unspecified whether native or transplanted heart Poorly controlled type 2 diabetes mellitus with circulatory disorder [facility])  Future Appointments  Date Time Provider Department Center  02/20/2022  4:00 PM [facility] VASC 3 [facility]  06/05/2022  3:00 PM Rennis Chris, MD TRE-TRE None    Patient Care Team: Adriana Prince, MD as PCP - General (Endocrinology) Adriana Bollman, MD as PCP - Cardiology (Cardiology)  HISTORY OF PRESENT ILLNESS: 76 y.o. female referred by Adriana Prince, MD presents for evaluation of Diarrhea.  She has medical history significant for hyperlipidemia, OSA, depression, factor V [place] with recurrent DVTs, poorly controlled type 2 diabetes insulin dependent with history of carotid artery disease, CKD stage IIIb, peripheral vascular disease, coronary artery disease status post CABG 2019, atrial fibrillation she is on warfarin, history of stroke, reflux. S/p cholestectomy.  She is on Metofomin 1000 mg BID, wellbutrin 300mg  QD for years.  Abx therapy: Omnicef 04/2021, has had 3 UTI's treated with ABX in last 2 months.  No sick contacts.   Per notes reviewed from primary care 12/21/2021 patient complaining of diarrhea for 3 months, will have 3-5 BM's daily, liquid/brown stools. Mostly after she eats, can have rare nocturnal symptoms where she wakes up and has to have BM, has had 1-2 episodes of fecal incontinence at night.  Rare bright red blood on toilet paper with rectal pain.  Denies blood in stool otherwise.  Has abdominal discomfort, states feels like it is on fire.  On proton pump inhibitor and takes Pepto-Bismol. Patient has longstanding history  of nausea with vomiting once a week, pantoprazole.  Denies dysphagia. Denies weight loss.  She has early satiety, denies AB bloating.   Had colonoscopy 5-10 years ago with Dr. Chales Abrahams in  [place]. Had EGD a long time ago.    CT abdomen pelvis 01/15/22 1. No acute intra-abdominal or intrapelvic process. 2. Diffuse diverticulosis of the descending and sigmoid colon without diverticulitis. Large duodenal diverticula as well, with no inflammatory change. 3.  Aortic Atherosclerosis (ICD10-I70.0).  08/04/2021 creatinine 1.1, BUN 26, albumin 4, AST 17, ALT 21, alkaline phosphatase 89, total bilirubin 0.3, calcium 10.6, white blood cell count 10.2, hemoglobin 15.1, MCV 92.3, platelets 245, thyroid 3.05, A1c 7.8, vitamin D 52.8   Current Medications:   Current Outpatient Medications (Endocrine & Metabolic):    Insulin Glargine (BASAGLAR KWIKPEN) 100 UNIT/ML, Inject 24 Units into the skin at bedtime.   insulin lispro (HUMALOG) 100 UNIT/ML injection, Inject 0.06 mLs (6 Units total) into the skin 3 (three) times daily before meals. (Patient taking differently: Inject 12 Units into the skin 3 (three) times daily before meals.)   metFORMIN (GLUCOPHAGE) 1000 MG tablet, Take 1,000 mg by mouth 2 (two) times daily with a meal.  Current Outpatient Medications (Cardiovascular):    chlorthalidone (HYGROTON) 25 MG tablet, Take 25 mg by mouth daily.   lisinopril (PRINIVIL,ZESTRIL) 5 MG tablet, Take 5 mg by mouth daily.   metoprolol tartrate (LOPRESSOR) 25 MG tablet, Take 0.5 tablets (12.5 mg total) by mouth 2 (two) times daily.   rosuvastatin (CRESTOR) 40 MG tablet, TAKE 1 TABLET(40 MG) BY MOUTH DAILY  Current Outpatient Medications (Respiratory):    promethazine (PHENERGAN) 25 MG tablet, Take 12.5 mg by mouth every evening.   Current Outpatient Medications (Analgesics):    acetaminophen (TYLENOL) 500 MG tablet, Take 500 mg by mouth every 6 (six) hours as needed.  Current Outpatient Medications (Hematological):    cyanocobalamin 2000 MCG tablet, Take 2,000 mcg by mouth daily.   warfarin (COUMADIN) 5 MG tablet, Take by mouth See admin instructions. Take 5 mg every Monday and Friday; 2.5  mg all other days of the week  Current Outpatient Medications (Other):    ACCU-CHEK GUIDE test strip, CHECK BLOOD SUGAR THREE TIMES DAILY   buPROPion (WELLBUTRIN XL) 300 MG 24 hr tablet, Take 300 mg by mouth daily.    Cholecalciferol (VITAMIN D) 2000 units CAPS, Take 2,000 Units by mouth daily.    clobetasol cream (TEMOVATE) 0.05 %, Apply 1 application topically daily as needed (for psoriasis).    Continuous Blood Gluc Sensor (FREESTYLE LIBRE 14 DAY SENSOR) MISC, 1 each by Does not apply route every 14 (fourteen) days. Change every 2 weeks   pantoprazole (PROTONIX) 40 MG tablet, Take 1 tablet (40 mg total) by mouth daily before breakfast.   potassium citrate (UROCIT-K) 10 MEQ (1080 MG) SR tablet, Take 10 mEq by mouth 2 (two) times daily.   white petrolatum (VASELINE) GEL, Apply 1 application topically daily as needed (for psoriasis).  Medical History:  Past Medical History:  Diagnosis Date   AKI (acute kidney injury) (HCC) 05/02/2018   Carotid artery disease (HCC)    hx of bilat CEA // Carotid US 12/19: R 40-59; L 1-39   Cellulitis of right leg 05/01/2018   Coronary artery disease    Myoview 10/19 High Risk // LHC 11/19 w/ 3 v CAD // Echo 11/19: EF 50-55, gr 1 DD, ant-sept, inf-sept, apical HK // s/p CABG in 11/2018   Depression    denies  DVT (deep venous thrombosis) (HCC)    in setting of Factor V Leiden mutation   Essential hypertension    Factor V Leiden mutation ([place])    Chronic Warfarin managed by PCP // hx of recurrent DVT; prior CVA   GERD (gastroesophageal reflux disease)    History of kidney stones    History of sepsis 05/01/2018   History of stroke 2004   right side weakness-resolved   HLD (hyperlipidemia)    Myocardial infarction (HCC)    Pneumonia    x2   PONV (postoperative nausea and vomiting)    blood pressure up/down, confusion   Poorly controlled type 2 diabetes mellitus (HCC)    Seizures ([place])    Sleep apnea    Allergies:  Allergies  Allergen Reactions    Aspirin Nausea And Vomiting   Propofol Nausea And Vomiting    Confused BP goes up and down.   Propofol Nausea And Vomiting    Confused BP goes up and down.   Dapagliflozin Other (See Comments)    "causes UTIs"     Ibandronic Acid    Sulfa Antibiotics     Other reaction(s): nausea   Trulicity [Dulaglutide]      Surgical History:  She  has a past surgical history that includes Abdominal hysterectomy (1989); Tubal ligation; Tonsillectomy; Cholecystectomy; Carotid artery endarterectomy; Back surgery; Cataract extraction (Bilateral, 2017); LEFT HEART CATH AND CORONARY ANGIOGRAPHY (N/A, 11/04/2018); trigger fingers left and right (Bilateral); Lithotripsy; Coronary artery bypass graft (N/A, 11/17/2018); TEE without cardioversion (N/A, 11/17/2018); and Cardiac catheterization. Family History:  Her family history includes COPD in her brother; Cataracts in her maternal grandmother; Diabetes in her brother and maternal grandmother; Lung cancer in her father; Stroke in her mother. Social History:   reports that she quit smoking about 19 years ago. Her smoking use included cigarettes. She has a 38.00 pack-year smoking history. She has never used smokeless tobacco. She reports that she does not drink alcohol and does not use drugs.  REVIEW OF SYSTEMS  : All other systems reviewed and negative except where noted in the History of Present Illness.   PHYSICAL EXAM: BP (!) 102/52    Pulse 66    Ht 5\' 6"  (1.676 m)    Wt 144 lb 4 oz (65.4 kg)    BMI 23.28 kg/m  General:   Pleasant, well developed female in no acute distress Head:  Normocephalic and atraumatic. Eyes: sclerae anicteric,conjunctive pink  Heart:  regular rate and rhythm Pulm: Clear anteriorly; no wheezing Abdomen:  Soft, Flat AB, skin exam normal, Normal bowel sounds. moderate tenderness in the epigastrium and in the lower abdomen. With guarding and Without rebound, without hepatomegaly. Extremities:  Without edema. Msk:  Symmetrical  without gross deformities. Peripheral pulses intact.  Neurologic:  Alert and  oriented x4;  grossly normal neurologically. Skin:   Dry and intact without significant lesions or rashes. Psychiatric: Demonstrates good judgement and reason without abnormal affect or behaviors.   Vladimir Crofts, PA-C 4:11 PM

## 2022-01-26 ENCOUNTER — Ambulatory Visit: Payer: Federal, State, Local not specified - PPO | Admitting: Physician Assistant

## 2022-01-26 ENCOUNTER — Encounter: Payer: Self-pay | Admitting: Physician Assistant

## 2022-01-26 ENCOUNTER — Other Ambulatory Visit (INDEPENDENT_AMBULATORY_CARE_PROVIDER_SITE_OTHER): Payer: Federal, State, Local not specified - PPO

## 2022-01-26 VITALS — BP 102/52 | HR 66 | Ht 66.0 in | Wt 144.2 lb

## 2022-01-26 DIAGNOSIS — K219 Gastro-esophageal reflux disease without esophagitis: Secondary | ICD-10-CM

## 2022-01-26 DIAGNOSIS — D6851 Activated protein C resistance: Secondary | ICD-10-CM

## 2022-01-26 DIAGNOSIS — I6300 Cerebral infarction due to thrombosis of unspecified precerebral artery: Secondary | ICD-10-CM

## 2022-01-26 DIAGNOSIS — R197 Diarrhea, unspecified: Secondary | ICD-10-CM

## 2022-01-26 DIAGNOSIS — K5731 Diverticulosis of large intestine without perforation or abscess with bleeding: Secondary | ICD-10-CM

## 2022-01-26 DIAGNOSIS — I4891 Unspecified atrial fibrillation: Secondary | ICD-10-CM | POA: Diagnosis not present

## 2022-01-26 DIAGNOSIS — E1159 Type 2 diabetes mellitus with other circulatory complications: Secondary | ICD-10-CM

## 2022-01-26 DIAGNOSIS — E1165 Type 2 diabetes mellitus with hyperglycemia: Secondary | ICD-10-CM

## 2022-01-26 DIAGNOSIS — I2510 Atherosclerotic heart disease of native coronary artery without angina pectoris: Secondary | ICD-10-CM

## 2022-01-26 LAB — CBC WITH DIFFERENTIAL/PLATELET
Basophils Absolute: 0 10*3/uL (ref 0.0–0.1)
Basophils Relative: 0.5 % (ref 0.0–3.0)
Eosinophils Absolute: 0.1 10*3/uL (ref 0.0–0.7)
Eosinophils Relative: 1.4 % (ref 0.0–5.0)
HCT: 40.8 % (ref 36.0–46.0)
Hemoglobin: 13.5 g/dL (ref 12.0–15.0)
Lymphocytes Relative: 29.6 % (ref 12.0–46.0)
Lymphs Abs: 2.6 10*3/uL (ref 0.7–4.0)
MCHC: 33 g/dL (ref 30.0–36.0)
MCV: 89.1 fl (ref 78.0–100.0)
Monocytes Absolute: 0.7 10*3/uL (ref 0.1–1.0)
Monocytes Relative: 7.7 % (ref 3.0–12.0)
Neutro Abs: 5.3 10*3/uL (ref 1.4–7.7)
Neutrophils Relative %: 60.8 % (ref 43.0–77.0)
Platelets: 273 10*3/uL (ref 150.0–400.0)
RBC: 4.58 Mil/uL (ref 3.87–5.11)
RDW: 14.2 % (ref 11.5–15.5)
WBC: 8.6 10*3/uL (ref 4.0–10.5)

## 2022-01-26 LAB — COMPREHENSIVE METABOLIC PANEL
ALT: 21 U/L (ref 0–35)
AST: 16 U/L (ref 0–37)
Albumin: 4.2 g/dL (ref 3.5–5.2)
Alkaline Phosphatase: 67 U/L (ref 39–117)
BUN: 27 mg/dL — ABNORMAL HIGH (ref 6–23)
CO2: 36 mEq/L — ABNORMAL HIGH (ref 19–32)
Calcium: 10.2 mg/dL (ref 8.4–10.5)
Chloride: 101 mEq/L (ref 96–112)
Creatinine, Ser: 1.06 mg/dL (ref 0.40–1.20)
GFR: 51.92 mL/min — ABNORMAL LOW (ref >60.00)
Glucose, Bld: 120 mg/dL — ABNORMAL HIGH (ref 70–99)
Potassium: 4.7 mEq/L (ref 3.5–5.1)
Sodium: 142 mEq/L (ref 135–145)
Total Bilirubin: 0.3 mg/dL (ref 0.2–1.2)
Total Protein: 7.1 g/dL (ref 6.0–8.3)

## 2022-01-26 MED ORDER — PANTOPRAZOLE SODIUM 40 MG PO TBEC: 40.0000 mg | DELAYED_RELEASE_TABLET | Freq: Every day | ORAL | 1 refills | Status: DC

## 2022-01-26 NOTE — Patient Instructions (Addendum)
If you are age 74 or older, your body mass index should be between 23-30. Your Body mass index is 23.28 kg/m. If this is out of the aforementioned range listed, please consider follow up with your Primary Care Provider  The [facility] providers would like to encourage you to use [facility] to communicate with providers for non-urgent requests or questions.  Due to long hold times on the telephone, sending your provider a message by [facility] may be a faster and more efficient way to get a response.  Please allow 48 business hours for a response.  Please remember that this is for non-urgent requests.  _______________________________________________________  Your provider has requested that you go to the basement level for lab work before leaving today. Press "B" on the elevator. The lab is located at the first door on the left as you exit the elevator.  Due to recent changes in healthcare laws, you may see the results of your imaging and laboratory studies on MyChart before your provider has had a chance to review them.  We understand that in some cases there may be results that are confusing or concerning to you. Not all laboratory results come back in the same time frame and the provider may be waiting for multiple results in order to interpret others.  Please give us 48 hours in order for your provider to thoroughly review all the results before contacting the office for clarification of your results.   We have sent the following medications to your pharmacy for you to pick up at your convenience: START: Pantoprazole 40mg  one tablet daily.  Can continue pepto daily if needed Please bring in stool samples.   Please take your proton pump inhibitor medication 30 minutes to 1 hour before meals- this makes it more effective.   Avoid spicy and acidic foods Avoid fatty foods Limit your intake of coffee, tea, alcohol, and carbonated drinks Work to maintain a healthy weight Keep the head of the bed  elevated at least 3 inches with blocks or a wedge pillow if you are having any nighttime symptoms Stay upright for 2 hours after eating Avoid meals and snacks three to four hours before bedtime Stop smoking  Gastroesophageal Reflux Disease, Adult Gastroesophageal reflux (GER) happens when acid from the stomach flows up into the tube that connects the mouth and the stomach (esophagus). Normally, food travels down the esophagus and stays in the stomach to be digested. However, when a person has GER, food and stomach acid sometimes move back up into the esophagus. If this becomes a more serious problem, the person may be diagnosed with a disease called gastroesophageal reflux disease (GERD). GERD occurs when the reflux: Happens often. Causes frequent or severe symptoms. Causes problems such as damage to the esophagus. When stomach acid comes in contact with the esophagus, the acid may cause inflammation in the esophagus. Over time, GERD may create small holes (ulcers) in the lining of the esophagus. What are the causes? This condition is caused by a problem with the muscle between the esophagus and the stomach (lower esophageal sphincter, or LES). Normally, the LES muscle closes after food passes through the esophagus to the stomach. When the LES is weakened or abnormal, it does not close properly, and that allows food and stomach acid to go back up into the esophagus. The LES can be weakened by certain dietary substances, medicines, and medical conditions, including: Tobacco use. Pregnancy. Having a hiatal hernia. Alcohol use. Certain foods and beverages, such as  coffee, chocolate, onions, and peppermint. What increases the risk? You are more likely to develop this condition if you: Have an increased body weight. Have a connective tissue disorder. Take NSAIDs, such as ibuprofen. What are the signs or symptoms? Symptoms of this condition include: Heartburn. Difficult or painful swallowing and  the feeling of having a lump in the throat. A bitter taste in the mouth. Bad breath and having a large amount of saliva. Having an upset or bloated stomach and belching. Chest pain. Different conditions can cause chest pain. Make sure you see your health care provider if you experience chest pain. Shortness of breath or wheezing. Ongoing (chronic) cough or a nighttime cough. Wearing away of tooth enamel. Weight loss. How is this diagnosed? This condition may be diagnosed based on a medical history and a physical exam. To determine if you have mild or severe GERD, your health care provider may also monitor how you respond to treatment. You may also have tests, including: A test to examine your stomach and esophagus with a small camera (endoscopy). A test that measures the acidity level in your esophagus. A test that measures how much pressure is on your esophagus. A barium swallow or modified barium swallow test to show the shape, size, and functioning of your esophagus. How is this treated? Treatment for this condition may vary depending on how severe your symptoms are. Your health care provider may recommend: Changes to your diet. Medicine. Surgery. The goal of treatment is to help relieve your symptoms and to prevent complications. Follow these instructions at home: Eating and drinking  Follow a diet as recommended by your health care provider. This may involve avoiding foods and drinks such as: Coffee and tea, with or without caffeine. Drinks that contain alcohol. Energy drinks and sports drinks. Carbonated drinks or sodas. Chocolate and cocoa. Peppermint and mint flavorings. Garlic and onions. Horseradish. Spicy and acidic foods, including peppers, chili powder, curry powder, vinegar, hot sauces, and barbecue sauce. Citrus fruit juices and citrus fruits, such as oranges, lemons, and limes. Tomato-based foods, such as red sauce, chili, salsa, and pizza with red sauce. Fried and  fatty foods, such as donuts, french fries, potato chips, and high-fat dressings. High-fat meats, such as hot dogs and fatty cuts of red and white meats, such as rib eye steak, sausage, ham, and bacon. High-fat dairy items, such as whole milk, butter, and cream cheese. Eat small, frequent meals instead of large meals. Avoid drinking large amounts of liquid with your meals. Avoid eating meals during the 2-3 hours before bedtime. Avoid lying down right after you eat. Do not exercise right after you eat. Lifestyle  Do not use any products that contain nicotine or tobacco. These products include cigarettes, chewing tobacco, and vaping devices, such as e-cigarettes. If you need help quitting, ask your health care provider. Try to reduce your stress by using methods such as yoga or meditation. If you need help reducing stress, ask your health care provider. If you are overweight, reduce your weight to an amount that is healthy for you. Ask your health care provider for guidance about a safe weight loss goal. General instructions Pay attention to any changes in your symptoms. Take over-the-counter and prescription medicines only as told by your health care provider. Do not take aspirin, ibuprofen, or other NSAIDs unless your health care provider told you to take these medicines. Wear loose-fitting clothing. Do not wear anything tight around your waist that causes pressure on your abdomen. Raise (elevate)  the head of your bed about 6 inches (15 cm). You can use a wedge to do this. Avoid bending over if this makes your symptoms worse. Keep all follow-up visits. This is important. Contact a health care provider if: You have: New symptoms. Unexplained weight loss. Difficulty swallowing or it hurts to swallow. Wheezing or a persistent cough. A hoarse voice. Your symptoms do not improve with treatment. Get help right away if: You have sudden pain in your arms, neck, jaw, teeth, or back. You suddenly  feel sweaty, dizzy, or light-headed. You have chest pain or shortness of breath. You vomit and the vomit is green, yellow, or black, or it looks like blood or coffee grounds. You faint. You have stool that is red, bloody, or black. You cannot swallow, drink, or eat. These symptoms may represent a serious problem that is an emergency. Do not wait to see if the symptoms will go away. Get medical help right away. Call your local emergency services (911 in the [place] Do not drive yourself to the hospital. Summary Gastroesophageal reflux happens when acid from the stomach flows up into the esophagus. GERD is a disease in which the reflux happens often, causes frequent or severe symptoms, or causes problems such as damage to the esophagus. Treatment for this condition may vary depending on how severe your symptoms are. Your health care provider may recommend diet and lifestyle changes, medicine, or surgery. Contact a health care provider if you have new or worsening symptoms. Take over-the-counter and prescription medicines only as told by your health care provider. Do not take aspirin, ibuprofen, or other NSAIDs unless your health care provider told you to do so. Keep all follow-up visits as told by your health care provider. This is important. This information is not intended to replace advice given to you by your health care provider. Make sure you discuss any questions you have with your health care provider. Document Revised: 06/13/2020 Document Reviewed: 06/13/2020 Elsevier Patient Education  2022 Else[employer] Inc.  Diverticulosis Diverticulosis is a condition that develops when small pouches (diverticula) form in the wall of the large intestine (colon). The colon is where water is absorbed and stool (feces) is formed. The pouches form when the inside layer of the colon pushes through weak spots in the outer layers of the colon. You may have a few pouches or many of them. The pouches usually do not  cause problems unless they become inflamed or infected. When this happens, the condition is called diverticulitis- this is left lower quadrant pain, diarrhea, fever, chills, nausea or vomiting.  If this occurs please call the office or go to the hospital. Sometimes these patches without inflammation can also have painless bleeding associated with them, if this happens please call the office or go to the hospital. Preventing constipation and increasing fiber can help reduce diverticula and prevent complications. Even if you feel you have a high-fiber diet, suggest getting on Benefiber 2-3 times daily. What increases the risk? The following factors may make you more likely to develop this condition: Being older than age 77. Your risk for this condition increases with age. Diverticulosis is rare among people younger than age 22. By age 70, many people have it. Eating a low-fiber diet. Having frequent constipation. Being overweight. Not getting enough exercise. Smoking. Taking over-the-counter pain medicines, like aspirin and ibuprofen.- can increase a flare.  Having a family history of diverticulosis. How is this diagnosed? Because diverticulosis usually has no symptoms, it is most often diagnosed  during an exam for other colon problems. The condition may be diagnosed by: Using a flexible scope to examine the colon (colonoscopy). Taking an X-ray of the colon after dye has been put into the colon (barium enema). Having a CT scan. How is this treated? You may not need treatment for this condition. Your health care provider may recommend treatment to prevent problems. You may need treatment if you have symptoms or if you previously had diverticulitis. Treatment may include: Eating a high-fiber diet. Taking a fiber supplement. Taking a live bacteria supplement (probiotic). Taking medicine to relax your colon. Contact a health care provider if you: Have pain in your abdomen. Have bloating. Have  cramps. Have not had a bowel movement in 3 days. Get help right away if: Your pain gets worse. Your bloating becomes very bad. You have a fever or chills, and your symptoms suddenly get worse. You vomit. You have bowel movements that are bloody or black. You have bleeding from your rectum. Summary Diverticulosis is a condition that develops when small pouches (diverticula) form in the wall of the large intestine (colon). You may have a few pouches or many of them. This condition is most often diagnosed during an exam for other colon problems. Treatment may include increasing the fiber in your diet, taking supplements, or taking medicines. This information is not intended to replace advice given to you by your health care provider. Make sure you discuss any questions you have with your health care provider. Document Revised: 07/02/2019 Document Reviewed: 07/02/2019 Elsevier Patient Education  2022 [employer].

## 2022-01-26 NOTE — Progress Notes (Signed)
Reviewed and agree with management plans. Please obtain prior endoscopy reports from Dr. Lyndel Safe.  Samarrah Tranchina L. Tarri Glenn, MD, MPH

## 2022-01-29 LAB — IGA: Immunoglobulin A: 242 mg/dL (ref 70–320)

## 2022-01-29 LAB — TISSUE TRANSGLUTAMINASE ABS,IGG,IGA
(tTG) Ab, IgA: <1 U/mL
(tTG) Ab, IgG: <1 U/mL

## 2022-02-12 ENCOUNTER — Encounter: Payer: Self-pay | Admitting: Cardiovascular Disease

## 2022-02-12 ENCOUNTER — Ambulatory Visit: Payer: Federal, State, Local not specified - PPO | Admitting: Cardiovascular Disease

## 2022-02-12 DIAGNOSIS — E041 Nontoxic single thyroid nodule: Secondary | ICD-10-CM

## 2022-02-20 ENCOUNTER — Other Ambulatory Visit: Payer: Self-pay

## 2022-02-20 ENCOUNTER — Ambulatory Visit (HOSPITAL_COMMUNITY)
Admission: RE | Admit: 2022-02-20 | Discharge: 2022-02-20 | Disposition: A | Discharge status: Home / Self Care | Payer: Federal, State, Local not specified - PPO | Source: Ambulatory Visit | Attending: Cardiology | Admitting: Cardiology

## 2022-02-20 DIAGNOSIS — I779 Disorder of arteries and arterioles, unspecified: Secondary | ICD-10-CM | POA: Insufficient documentation

## 2022-02-20 DIAGNOSIS — I6523 Occlusion and stenosis of bilateral carotid arteries: Secondary | ICD-10-CM

## 2022-02-22 NOTE — Telephone Encounter (Signed)
-----   Message from [name], MD sent at 02/22/2022  1:33 PM EST ----- ?Carotid findings are stable with 40-59% RICA stenosis that should be followed again at one year. Dedicated thyroid US recommended. Please order. thanks ?

## 2022-02-22 NOTE — Telephone Encounter (Signed)
Called and reviewed results and recommendations with pt. Order placed at this time for ultrasound of thyroid.  ?

## 2022-03-02 ENCOUNTER — Other Ambulatory Visit: Payer: Federal, State, Local not specified - PPO

## 2022-03-02 DIAGNOSIS — R197 Diarrhea, unspecified: Secondary | ICD-10-CM

## 2022-03-02 DIAGNOSIS — K219 Gastro-esophageal reflux disease without esophagitis: Secondary | ICD-10-CM

## 2022-03-04 LAB — H. PYLORI ANTIGEN, STOOL: H pylori Ag, Stl: NEGATIVE

## 2022-03-12 LAB — CLOSTRIDIUM DIFFICILE TOXIN B, QUALITATIVE, REAL-TIME PCR: Toxigenic C. Difficile by PCR: NOT DETECTED

## 2022-03-12 LAB — PANCREATIC ELASTASE, FECAL: Pancreatic Elastase-1, Stool: >500 mcg/g

## 2022-03-15 ENCOUNTER — Telehealth: Payer: Self-pay

## 2022-03-15 NOTE — Telephone Encounter (Signed)
Patient returned call & was given results. No further questions. ?

## 2022-03-19 ENCOUNTER — Ambulatory Visit
Admission: RE | Admit: 2022-03-19 | Discharge: 2022-03-19 | Disposition: A | Discharge status: Home / Self Care | Payer: Federal, State, Local not specified - PPO | Source: Ambulatory Visit | Attending: Cardiovascular Disease | Admitting: Cardiovascular Disease

## 2022-03-19 DIAGNOSIS — E041 Nontoxic single thyroid nodule: Secondary | ICD-10-CM

## 2022-03-26 ENCOUNTER — Inpatient Hospital Stay (HOSPITAL_COMMUNITY): Admission: RE | Admit: 2022-03-26 | Payer: Federal, State, Local not specified - PPO | Source: Ambulatory Visit

## 2022-03-26 NOTE — Progress Notes (Signed)
[facility] stated patient should not receive reclast today  do to labs. Office will check with physician and reschedule patient. ?

## 2022-03-30 ENCOUNTER — Ambulatory Visit: Payer: Federal, State, Local not specified - PPO | Admitting: Gastroenterology

## 2022-03-30 ENCOUNTER — Other Ambulatory Visit (HOSPITAL_COMMUNITY): Payer: Self-pay | Admitting: Cardiovascular Disease

## 2022-03-30 ENCOUNTER — Encounter: Payer: Self-pay | Admitting: Gastroenterology

## 2022-03-30 ENCOUNTER — Other Ambulatory Visit: Payer: Federal, State, Local not specified - PPO

## 2022-03-30 VITALS — BP 103/62 | HR 66 | Ht 66.0 in | Wt 144.0 lb

## 2022-03-30 DIAGNOSIS — I6529 Occlusion and stenosis of unspecified carotid artery: Secondary | ICD-10-CM

## 2022-03-30 DIAGNOSIS — R197 Diarrhea, unspecified: Secondary | ICD-10-CM

## 2022-03-30 MED ORDER — RIFAXIMIN 550 MG PO TABS: 550.0000 mg | ORAL_TABLET | Freq: Three times a day (TID) | ORAL | 0 refills | Status: DC

## 2022-03-30 NOTE — Patient Instructions (Addendum)
It was a pleasure to see you today. ? ?Carbonated beverages, sugar substitutes, and dairy may make diarrhea worse.  ? ?I have recommended a stool test to look for inflammation. If this test is positive, we may want to more seriously consider the risks of colonoscopy.  ? ?Xifaxan is a gut specific antibiotic that may improve your diarrhea. This is taken twice daily for 2 weeks. If the cost is prohibitive, please let me know. We can consider alternatives.  ? ? ?

## 2022-03-30 NOTE — Progress Notes (Signed)
? ?Referring Provider: Adrian PrinceSouth, Stephen, MD ?Primary Care Physician:  Adrian PrinceSouth, Stephen, MD ? ?Chief complaint:  Diarrhea ? ? ?IMPRESSION:  ?Chronic diarrhea with nocturnal incontinence ? ?Differential includes bile acid diarrhea following prior cholecystectomy, SIBO with increased risk in the setting of duodenal diverticulum, and chronic colitis including microscopic colitis and inflammatory bowel disease given her concurrent history of psoriatic arthritis.  Ideally would proceed with colonoscopy with random biopsies.  However she is high risk for endoscopic procedures given her comorbidities and need for anticoagulation.  Therefore, we have discussed attempts to manage her symptoms for treatment trials.  We will obtain fecal calprotectin.  If this is elevated would reconsider the risks and benefits for colonoscopy as the chance of colitis increases.  We discussed a 2-week trial of Xifaxan for possible SIBO.  If this does not provide symptomatic relief would consider colestipol. ? ?She has a history of reflux.  Symptoms are controlled on pantoprazole.  Diarrhea was occurring prior to the initiation of pantoprazole and I do not believe it is contributing to her symptoms at this time. ? ?PLAN: ?- Continue pantoprazole ?- Fecal calprotectin ?-Consider a daily stool bulking agent such as Metamucil or Benefiber ?- Xifaxan 550 mg TID x 14 days, switch to doxycycline 100 BID x 14 days if Xifaxan is cost prohibitive ?- Office follow-up in 4-6 weeks, earlier if needed ? ?HPI: Adriana [name] is a 76 y.o. female returns in follow-up.  She has significant comorbidities including factor V Leiden with recurrent DVTs, prior stroke, hyperlipidemia, obstructive sleep apnea, poorly controlled type 2 diabetes with a history of coronary artery disease, chronic kidney disease, nephrolithiasis, peripheral vascular disease, coronary artery disease status post CABG 2019, psoriatic arthritis of the right ankle and atrial fibrillation.   She is on chronic warfarin.  She has had a prior cholecystectomy. ? ?This is my first visit with Adriana [name].  She was seen by Quentin MullingAmanda Collier 01/26/2022 for 6-8 months of diarrhea with diffuse abdominal sensitivity.  She has been having 3-4 watery explosive bowel movements daily, often postprandial.  Its become bothering some enough with multiple accidents including nocturnal stooling and fecal incontinence.  Baseline bowel habits are 1 formed bowel movement daily.  There is no associated weight loss, early satiety, abdominal pain, or bloating. ? ?Recent evaluation includes: ?- CT of the abdomen pelvis 01/15/2022 that showed diffuse diverticulosis and a large duodenal diverticula but no acute intra-abdominal or intrapelvic processes ?- H. pylori stool antigen negative ?- Pancreatic elastase normal ?- C. difficile toxin negative ?- Normal TTG a and IgA ?- Normal liver enzymes ?- Normal CBC ? ?Treatment trials: ?- 2-3 weeks probiotics may have been helpful. ?- Liquid PeptoBismal helps but she does not want to live off PeptoBismal ? ?She had a history of [place] ? ? ?Past Medical History:  ?Diagnosis Date  ? AKI (acute kidney injury) (HCC) 05/02/2018  ? Carotid artery disease (HCC)   ? hx of bilat CEA // Carotid US 12/19: R 40-59; L 1-39  ? Cellulitis of right leg 05/01/2018  ? Coronary artery disease   ? Myoview 10/19 High Risk // LHC 11/19 w/ 3 v CAD // Echo 11/19: EF 50-55, gr 1 DD, ant-sept, inf-sept, apical HK // s/p CABG in 11/2018  ? Depression   ? denies  ? DVT (deep venous thrombosis) (HCC)   ? in setting of Factor V Leiden mutation  ? Essential hypertension   ? Factor V Leiden mutation (HCC)   ?  Chronic Warfarin managed by PCP // hx of recurrent DVT; prior CVA  ? GERD (gastroesophageal reflux disease)   ? History of kidney stones   ? History of sepsis 05/01/2018  ? History of stroke 2004  ? right side weakness-resolved  ? HLD (hyperlipidemia)   ? Myocardial infarction  [facility])   ? Pneumonia   ? x2  ? PONV (postoperative nausea and vomiting)   ? blood pressure up/down, confusion  ? Poorly controlled type 2 diabetes mellitus (HCC)   ? Seizures (HCC)   ? Sleep apnea   ? ? ?Past Surgical History:  ?Procedure Laterality Date  ? ABDOMINAL HYSTERECTOMY  1989  ? BACK SURGERY    ? CARDIAC CATHETERIZATION    ? Carotid artery endarterectomy    ? right and left side with stents  ? CATARACT EXTRACTION Bilateral 2017  ? CHOLECYSTECTOMY    ? CORONARY ARTERY BYPASS GRAFT N/A 11/17/2018  ? Procedure: CORONARY ARTERY BYPASS GRAFTING (CABG), ON PUMP, TIMES FIVE, USING LEFT INTERNAL MAMMARY ARTERY AND ENDOSCOPICALLY HARVESTED LEFT GREATER SAPHENOUS VEIN;  Surgeon: Alleen Borne, MD;  Location: [facility] OR;  Service: Open Heart Surgery;  Laterality: N/A;  ? LEFT HEART CATH AND CORONARY ANGIOGRAPHY N/A 11/04/2018  ? Procedure: LEFT HEART CATH AND CORONARY ANGIOGRAPHY;  Surgeon: [name], MD;  Location: [facility] INVASIVE CV LAB;  Service: Cardiovascular;  Laterality: N/A;  ? LITHOTRIPSY    ? x3  ? TEE WITHOUT CARDIOVERSION N/A 11/17/2018  ? Procedure: TRANSESOPHAGEAL ECHOCARDIOGRAM (TEE);  Surgeon: Alleen Borne, MD;  [facility] OR;  Service: Open Heart Surgery;  Laterality: N/A;  ? TONSILLECTOMY    ? trigger fingers left and right Bilateral   ? TUBAL LIGATION    ? ? ?Prior to Admission medications   ?Medication Sig Start Date End Date Taking? Authorizing Provider  ?ACCU-CHEK GUIDE test strip CHECK BLOOD SUGAR THREE TIMES DAILY 03/18/21  Yes [provider]  ?acetaminophen (TYLENOL) 500 MG tablet Take 500 mg by mouth every 6 (six) hours as needed.   Yes [provider]  ?buPROPion (WELLBUTRIN XL) 300 MG 24 hr tablet Take 300 mg by mouth daily.  07/30/18  Yes [provider]  ?chlorthalidone (HYGROTON) 25 MG tablet Take 25 mg by mouth daily.   Yes [provider]  ?Cholecalciferol (VITAMIN D) 2000 units CAPS Take 2,000 Units by mouth daily.    Yes [provider]   ?clobetasol cream (TEMOVATE) 0.05 % Apply 1 application topically daily as needed (for psoriasis).    Yes [provider]  ?Continuous Blood Gluc Sensor (FREESTYLE LIBRE 14 DAY SENSOR) MISC 1 each by Does not apply route every 14 (fourteen) days. Change every 2 weeks 03/27/18  Yes Carlus Pavlov, MD  ?cyanocobalamin 2000 MCG tablet Take 2,000 mcg by mouth daily.   Yes [provider]  ?Insulin Glargine (BASAGLAR KWIKPEN) 100 UNIT/ML Inject 24 Units into the skin at bedtime.   Yes [provider]  ?insulin lispro (HUMALOG) 100 UNIT/ML injection Inject 0.06 mLs (6 Units total) into the skin 3 (three) times daily before meals. ?Patient taking differently: Inject 12 Units into the skin 3 (three) times daily before meals. 11/24/18  Yes Conte, Tessa N, PA-C  ?lisinopril (PRINIVIL,ZESTRIL) 5 MG tablet Take 5 mg by mouth daily.   Yes [provider]  ?metFORMIN (GLUCOPHAGE) 1000 MG tablet Take 1,000 mg by mouth 2 (two) times daily with a meal.   Yes [provider]  ?metoprolol tartrate (LOPRESSOR) 25 MG tablet Take  25 mg by mouth 2 (two) times daily.   Yes [provider]  ?pantoprazole (PROTONIX) 40 MG tablet Take 1 tablet (40 mg total) by mouth daily before breakfast. 01/26/22  Yes Quentin Mulling R, PA-C  ?potassium citrate (UROCIT-K) 10 MEQ (1080 MG) SR tablet Take 10 mEq by mouth 2 (two) times daily. 11/17/19  Yes [provider]  ?promethazine (PHENERGAN) 25 MG tablet Take 12.5 mg by mouth every evening.  04/14/18  Yes [provider]  ?rosuvastatin (CRESTOR) 40 MG tablet TAKE 1 TABLET(40 MG) BY MOUTH DAILY 08/14/21  Yes [name], MD  ?warfarin (COUMADIN) 5 MG tablet Take by mouth See admin instructions. Take 5 mg every Monday and Friday; 2.5 mg all other days of the week   Yes [provider]  ?white petrolatum (VASELINE) GEL Apply 1 application topically daily as needed (for psoriasis).   Yes [provider]  ? ? ?Current  Outpatient Medications  ?Medication Sig Dispense Refill  ? ACCU-CHEK GUIDE test strip CHECK BLOOD SUGAR THREE TIMES DAILY    ? acetaminophen (TYLENOL) 500 MG tablet Take 500 mg by mouth every 6 (six) hou

## 2022-04-13 ENCOUNTER — Other Ambulatory Visit: Payer: Federal, State, Local not specified - PPO

## 2022-04-13 DIAGNOSIS — R197 Diarrhea, unspecified: Secondary | ICD-10-CM

## 2022-04-20 LAB — CALPROTECTIN, FECAL: Calprotectin, Fecal: 31 ug/g (ref 0–120)

## 2022-05-11 ENCOUNTER — Ambulatory Visit: Payer: Federal, State, Local not specified - PPO | Admitting: Gastroenterology

## 2022-05-11 ENCOUNTER — Encounter: Payer: Self-pay | Admitting: Gastroenterology

## 2022-05-11 VITALS — BP 96/52 | HR 72 | Ht 64.5 in | Wt 145.0 lb

## 2022-05-11 DIAGNOSIS — R197 Diarrhea, unspecified: Secondary | ICD-10-CM

## 2022-05-11 MED ORDER — RIFAXIMIN 550 MG PO TABS: 550.0000 mg | ORAL_TABLET | Freq: Three times a day (TID) | ORAL | 0 refills | Status: DC

## 2022-05-11 NOTE — Patient Instructions (Signed)
It was my pleasure to provide care to you today. Based on our discussion, I am providing you with my recommendations below:  RECOMMENDATION(S):   Resume pantoprazole  Consider a daily stool bulking agent such as Metamucil or Benefiber  Repeat Xifaxan 550 mg TID x 14 days  Follow-up with Dr. Forde Dandy: blood pressure    FOLLOW UP:  I would like for you to follow up with me in 2-3 months. Please call the office at [phone] to schedule your appointment.  BMI:  If you are age 74 or older, your body mass index should be between 23-30. Your Body mass index is 24.5 kg/m. If this is out of the aforementioned range listed, please consider follow up with your Primary Care Provider.  MY CHART:  The [facility] providers would like to encourage you to use [facility] to communicate with providers for non-urgent requests or questions.  Due to long hold times on the telephone, sending your provider a message by [facility] may be a faster and more efficient way to get a response.  Please allow 48 business hours for a response.  Please remember that this is for non-urgent requests.   Thank you for trusting me with your gastrointestinal care!    Thornton Park, MD, MPH

## 2022-05-11 NOTE — Progress Notes (Signed)
Referring Provider: [name], MD Primary Care Physician:  [name], MD  Chief complaint:  Diarrhea   IMPRESSION:  Chronic diarrhea with nocturnal incontinence. 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 Differential includes bile acid diarrhea following prior cholecystectomy and chronic colitis including microscopic colitis.  However she is high risk for endoscopic procedures given her comorbidities and need for anticoagulation. She does not wish to have another due to the risks.  PLAN: - Continue pantoprazole - Consider a daily stool bulking agent such as Metamucil or Benefiber - Repeat Xifaxan 550 mg TID x 14 days - Consider colestipol if no improvement for possible bile acid diarrhea - Follow-up with Dr. Forde Dandy: blood pressure - Office follow-up in 2-3 months, earlier if needed  HPI: Adriana [name] is a 76 y.o. female returns in follow-up.   She was seen by Vicie Mutters 01/26/2022 for 6-8 months of diarrhea with diffuse abdominal sensitivity.  She has been having 3-4 watery explosive bowel movements daily, often postprandial.  Its become bothering some enough with multiple accidents including nocturnal stooling and fecal incontinence.  Baseline bowel habits are 1 formed bowel movement daily.  There is no associated weight loss, early satiety, abdominal pain, or bloating.  Seen by me 03/30/22 after completing 2 weeks of Xifaxan 550 mg TID x 14 days. Frequency of bowel movements have improved. Bowel habits are more formed.  She will have one runny BM daily instead of a runny bowel movement after every time she eats. She continues to have some abdominal discomfort.  She stopped taking the pantoprazole but she thinks she felt better while takiing them.   She returns today in follow-up after completing a second course of Xifaxan. She is overall doing some better. But still having about 3 bowel movements daily almost  always occurring postprandially. Abdominal pain has largely resolved. Continues to have frequent nausea. No nocturnal stooling or incontinence. No associated weight loss, early satiety, or bloating.   She remains anxious about a colonoscopy and would prefer to avoid it if possible.   Recent evaluation includes: - CT of the abdomen pelvis 01/15/2022 that showed diffuse diverticulosis and a large duodenal diverticula but no acute intra-abdominal or intrapelvic processes - H. pylori stool antigen negative - Pancreatic elastase normal - C. difficile toxin negative - Fecal calprotectin normal - Normal TTG a and IgA - Normal liver enzymes - Normal CBC  Treatment trials: - 2-3 weeks probiotics may have been helpful. - Liquid PeptoBismal helps but she does not want to live off PeptoBismal - Xifaxan 550 mg TID x 14 days - Repeat of Xifaxan 550 mg TID x 14 days given nearly complete improvement with first trial  She had a history of a colonoscopy over 5 years ago in [place]. Does not wish to have another.    Past Medical History:  Diagnosis Date   AKI (acute kidney injury) ([place]) 05/02/2018   Carotid artery disease ([place])    hx of bilat CEA // Carotid US 12/19: R 40-59; L 1-39   Cellulitis of right leg 05/01/2018   Coronary artery disease    Myoview 10/19 High Risk // LHC 11/19 w/ 3 v CAD // Echo 11/19: EF 50-55, gr 1 DD, ant-sept, inf-sept, apical HK // s/p CABG in 11/2018   Depression    denies   DVT (deep venous thrombosis) ([place])    in setting of Factor V Leiden mutation   Essential hypertension  Factor V Leiden mutation (HCC)    Chronic Warfarin managed by PCP // hx of recurrent DVT; prior CVA   GERD (gastroesophageal reflux disease)    History of kidney stones    History of sepsis 05/01/2018   History of stroke 2004   right side weakness-resolved   HLD (hyperlipidemia)    Myocardial infarction (HCC)    Pneumonia    x2   PONV (postoperative nausea and vomiting)    blood  pressure up/down, confusion   Poorly controlled type 2 diabetes mellitus (HCC)    Seizures (HCC)    Sleep apnea     Past Surgical History:  Procedure Laterality Date   ABDOMINAL HYSTERECTOMY  1989   BACK SURGERY     CARDIAC CATHETERIZATION     Carotid artery endarterectomy     right and left side with stents   CATARACT EXTRACTION Bilateral 2017   CHOLECYSTECTOMY     CORONARY ARTERY BYPASS GRAFT N/A 11/17/2018   Procedure: CORONARY ARTERY BYPASS GRAFTING (CABG), ON PUMP, TIMES FIVE, USING LEFT INTERNAL MAMMARY ARTERY AND ENDOSCOPICALLY HARVESTED LEFT GREATER SAPHENOUS VEIN;  Surgeon: Alleen Borne, MD;  Location: [facility] OR;  Service: Open Heart Surgery;  Laterality: N/A;   LEFT HEART CATH AND CORONARY ANGIOGRAPHY N/A 11/04/2018   Procedure: LEFT HEART CATH AND CORONARY ANGIOGRAPHY;  Surgeon: [name], MD;  Location: [facility] INVASIVE CV LAB;  Service: Cardiovascular;  Laterality: N/A;   LITHOTRIPSY     x3   TEE WITHOUT CARDIOVERSION N/A 11/17/2018   Procedure: TRANSESOPHAGEAL ECHOCARDIOGRAM (TEE);  Surgeon: Alleen Borne, MD;  Location: [facility] OR;  Service: Open Heart Surgery;  Laterality: N/A;   TONSILLECTOMY     trigger fingers left and right Bilateral    TUBAL LIGATION      Current Outpatient Medications  Medication Sig Dispense Refill   ACCU-CHEK GUIDE test strip CHECK BLOOD SUGAR THREE TIMES DAILY     acetaminophen (TYLENOL) 500 MG tablet Take 500 mg by mouth every 6 (six) hours as needed.     buPROPion (WELLBUTRIN XL) 300 MG 24 hr tablet Take 300 mg by mouth daily.   2   chlorthalidone (HYGROTON) 25 MG tablet Take 25 mg by mouth daily.     Cholecalciferol (VITAMIN D) 2000 units CAPS Take 2,000 Units by mouth daily.      clobetasol cream (TEMOVATE) 0.05 % Apply 1 application topically daily as needed (for psoriasis).      Continuous Blood Gluc Sensor (FREESTYLE LIBRE 14 DAY SENSOR) MISC 1 each by Does not apply route every 14 (fourteen) days. Change every 2 weeks 2 each 11    cyanocobalamin 2000 MCG tablet Take 2,000 mcg by mouth daily.     Insulin Glargine (BASAGLAR KWIKPEN) 100 UNIT/ML Inject 24 Units into the skin at bedtime.     insulin lispro (HUMALOG) 100 UNIT/ML injection Inject 0.06 mLs (6 Units total) into the skin 3 (three) times daily before meals. (Patient taking differently: Inject 12 Units into the skin 3 (three) times daily before meals.) 10 mL 11   lisinopril (PRINIVIL,ZESTRIL) 5 MG tablet Take 5 mg by mouth daily.     metFORMIN (GLUCOPHAGE) 1000 MG tablet Take 1,000 mg by mouth 2 (two) times daily with a meal.     metoprolol tartrate (LOPRESSOR) 25 MG tablet Take 25 mg by mouth 2 (two) times daily.     pantoprazole (PROTONIX) 40 MG tablet Take 1 tablet (40 mg total) by mouth daily before breakfast. 90 tablet 1  potassium citrate (UROCIT-K) 10 MEQ (1080 MG) SR tablet Take 10 mEq by mouth 2 (two) times daily.     promethazine (PHENERGAN) 25 MG tablet Take 12.5 mg by mouth every evening.   1   rifaximin (XIFAXAN) 550 MG TABS tablet Take 1 tablet (550 mg total) by mouth 3 (three) times daily. 42 tablet 0   rosuvastatin (CRESTOR) 40 MG tablet TAKE 1 TABLET(40 MG) BY MOUTH DAILY 90 tablet 3   warfarin (COUMADIN) 5 MG tablet Take by mouth See admin instructions. Take 5 mg every Monday and Friday; 2.5 mg all other days of the week     white petrolatum (VASELINE) GEL Apply 1 application topically daily as needed (for psoriasis).     No current facility-administered medications for this visit.    Allergies as of 05/11/2022 - Review Complete 05/11/2022  Allergen Reaction Noted   Aspirin Nausea And Vomiting 03/27/2018   Propofol Nausea And Vomiting 03/27/2018   Propofol Nausea And Vomiting 03/27/2018   Dapagliflozin Other (See Comments) 01/19/2022   Ibandronic acid  05/02/2021   Sulfa antibiotics  0000000   Trulicity [dulaglutide]  01/17/2022      Physical Exam: General:   Alert,  well-nourished, pleasant and cooperative in NAD Head:   Normocephalic and atraumatic. Eyes:  Sclera clear, no icterus.   Conjunctiva pink. Abdomen:  Soft, nontender, nondistended, normal bowel sounds, no rebound or guarding. No hepatosplenomegaly.   Neurologic:  Alert and  oriented x4;  grossly nonfocal Skin:  Intact without significant lesions or rashes. Psych:  Alert and cooperative. Normal mood and affect.    Lashun Mccants L. Tarri Glenn, MD, MPH 05/21/2022, 10:00 AM

## 2022-05-15 ENCOUNTER — Other Ambulatory Visit (HOSPITAL_COMMUNITY): Payer: Self-pay

## 2022-05-15 ENCOUNTER — Telehealth: Payer: Self-pay | Admitting: Pharmacy Technician

## 2022-05-15 NOTE — Telephone Encounter (Signed)
Patient Advocate Encounter  Received notification from [employer] that prior authorization for XIFAXAN 550MG  is required.   PA submitted on 5.30.23 Key [id number] Status is pending   [facility] will continue to follow  11-26-1970, CPhT Patient Advocate Phone: [phone]

## 2022-05-21 ENCOUNTER — Encounter: Payer: Self-pay | Admitting: Gastroenterology

## 2022-05-23 NOTE — Telephone Encounter (Signed)
Patient called wanting to follow up with here PA for Xifaxan. Please advise.

## 2022-05-23 NOTE — Progress Notes (Signed)
[facility] Note  06/05/2022     CHIEF COMPLAINT Patient presents for Retina Follow Up    HISTORY OF PRESENT ILLNESS: Adriana [name] is a 76 y.o. female who presents to the clinic today for:   HPI     Retina Follow Up   Patient presents with  Other.  In both eyes.  This started 1 year ago.  I, the attending physician,  performed the HPI with the patient and updated documentation appropriately.        Comments   Patient here for 1 year retina follow up for  DM exam. Patient states vision doing ok. About the same. No eye pain.      Last edited by [name], MD on 06/06/2022  5:37 PM.      Referring physician: [name], MD [place],   [id number]  HISTORICAL INFORMATION:   Selected notes from the MEDICAL RECORD NUMBER Referred by [name], NP for DM exam LEE:  Ocular Hx- PMH-depression, HTN, high cholesterol, stroke, DM (taking metformin)    CURRENT MEDICATIONS: No current outpatient medications on file. (Ophthalmic Drugs)   No current facility-administered medications for this visit. (Ophthalmic Drugs)   Current Outpatient Medications (Other)  Medication Sig   ACCU-CHEK GUIDE test strip CHECK BLOOD SUGAR THREE TIMES DAILY   acetaminophen (TYLENOL) 500 MG tablet Take 500 mg by mouth every 6 (six) hours as needed.   buPROPion (WELLBUTRIN XL) 300 MG 24 hr tablet Take 300 mg by mouth daily.    chlorthalidone (HYGROTON) 25 MG tablet Take 25 mg by mouth daily.   Cholecalciferol (VITAMIN D) 2000 units CAPS Take 2,000 Units by mouth daily.    clobetasol cream (TEMOVATE) [id number] % Apply 1 application topically daily as needed (for psoriasis).    Continuous Blood Gluc Sensor (FREESTYLE LIBRE 14 DAY SENSOR) MISC 1 each by Does not apply route every 14 (fourteen) days. Change every 2 weeks   cyanocobalamin 2000 MCG tablet Take 2,000 mcg by mouth daily.   Insulin Glargine (BASAGLAR KWIKPEN) 100 UNIT/ML  Inject 24 Units into the skin at bedtime.   insulin lispro (HUMALOG) 100 UNIT/ML injection Inject 0.06 mLs (6 Units total) into the skin 3 (three) times daily before meals. (Patient taking differently: Inject 12 Units into the skin 3 (three) times daily before meals.)   lisinopril (PRINIVIL,ZESTRIL) 5 MG tablet Take 5 mg by mouth daily.   metFORMIN (GLUCOPHAGE) 1000 MG tablet Take 1,000 mg by mouth 2 (two) times daily with a meal.   metoprolol tartrate (LOPRESSOR) 25 MG tablet Take 25 mg by mouth 2 (two) times daily.   pantoprazole (PROTONIX) 40 MG tablet Take 1 tablet (40 mg total) by mouth daily before breakfast.   potassium citrate (UROCIT-K) 10 MEQ (1080 MG) SR tablet Take 10 mEq by mouth 2 (two) times daily.   promethazine (PHENERGAN) 25 MG tablet Take 12.5 mg by mouth every evening.    rifaximin (XIFAXAN) 550 MG TABS tablet Take 1 tablet (550 mg total) by mouth 3 (three) times daily.   rosuvastatin (CRESTOR) 40 MG tablet TAKE 1 TABLET(40 MG) BY MOUTH DAILY   warfarin (COUMADIN) 5 MG tablet Take by mouth See admin instructions. Take 5 mg every Monday and Friday; 2.5 mg all other days of the week   white petrolatum (VASELINE) GEL Apply 1 application topically daily as needed (for psoriasis).   No current facility-administered medications for this visit. (Other)   REVIEW OF SYSTEMS: ROS  Positive for: Endocrine, Cardiovascular, Eyes, Psychiatric Negative for: Constitutional, Gastrointestinal, Neurological, Skin, Genitourinary, Musculoskeletal, HENT, Respiratory, Allergic/Imm, Heme/Lymph Last edited by [name], COA on 06/05/2022  3:30 PM.     ALLERGIES Allergies  Allergen Reactions   Aspirin Nausea And Vomiting   Propofol Nausea And Vomiting    Confused BP goes up and down.   Propofol Nausea And Vomiting    Confused BP goes up and down.   Dapagliflozin Other (See Comments)    "causes UTIs"     Ibandronic Acid    Sulfa Antibiotics     Other reaction(s): nausea    Trulicity [Dulaglutide]    PAST MEDICAL HISTORY Past Medical History:  Diagnosis Date   AKI (acute kidney injury) ([place]) 05/02/2018   Carotid artery disease ([place])    hx of bilat CEA // Carotid US 12/19: R 40-59; L 1-39   Cellulitis of right leg 05/01/2018   Coronary artery disease    Myoview 10/19 High Risk // LHC 11/19 w/ 3 v CAD // Echo 11/19: EF 50-55, gr 1 DD, ant-sept, inf-sept, apical HK // s/p CABG in 11/2018   Depression    denies   DVT (deep venous thrombosis) (HCC)    in setting of Factor V Leiden mutation   Essential hypertension    Factor V Leiden mutation ([place])    Chronic Warfarin managed by PCP // hx of recurrent DVT; prior CVA   GERD (gastroesophageal reflux disease)    History of kidney stones    History of sepsis 05/01/2018   History of stroke 2004   right side weakness-resolved   HLD (hyperlipidemia)    Myocardial infarction (HCC)    Pneumonia    x2   PONV (postoperative nausea and vomiting)    blood pressure up/down, confusion   Poorly controlled type 2 diabetes mellitus (HCC)    Seizures ([place])    Sleep apnea    Past Surgical History:  Procedure Laterality Date   ABDOMINAL HYSTERECTOMY  1989   BACK SURGERY     CARDIAC CATHETERIZATION     Carotid artery endarterectomy     right and left side with stents   CATARACT EXTRACTION Bilateral 2017   CHOLECYSTECTOMY     CORONARY ARTERY BYPASS GRAFT N/A 11/17/2018   Procedure: CORONARY ARTERY BYPASS GRAFTING (CABG), ON PUMP, TIMES FIVE, USING LEFT INTERNAL MAMMARY ARTERY AND ENDOSCOPICALLY HARVESTED LEFT GREATER SAPHENOUS VEIN;  Surgeon: Gaye Pollack, MD;  Location: [facility] OR;  Service: Open Heart Surgery;  Laterality: N/A;   LEFT HEART CATH AND CORONARY ANGIOGRAPHY N/A 11/04/2018   Procedure: LEFT HEART CATH AND CORONARY ANGIOGRAPHY;  Surgeon: Sherren Mocha, MD;  Location: [facility] CV LAB;  Service: Cardiovascular;  Laterality: N/A;   LITHOTRIPSY     x3   TEE WITHOUT CARDIOVERSION N/A 11/17/2018   Procedure:  TRANSESOPHAGEAL ECHOCARDIOGRAM (TEE);  Surgeon: Gaye Pollack, MD;  Location: [facility];  Service: Open Heart Surgery;  Laterality: N/A;   TONSILLECTOMY     trigger fingers left and right Bilateral    TUBAL LIGATION     FAMILY HISTORY Family History  Problem Relation Age of Onset   Stroke Mother    Lung cancer Father    COPD Brother    Diabetes Brother    Cataracts Maternal Grandmother    Diabetes Maternal Grandmother    Amblyopia Neg Hx    Blindness Neg Hx    Glaucoma Neg Hx    Hypertension Neg Hx    Macular degeneration Neg  Hx    Retinal detachment Neg Hx    Strabismus Neg Hx    Retinitis pigmentosa Neg Hx    Colon cancer Neg Hx    Esophageal cancer Neg Hx    Pancreatic cancer Neg Hx    Stomach cancer Neg Hx    SOCIAL HISTORY Social History   Tobacco Use   Smoking status: Former    Packs/day: 1.00    Years: 38.00    Total pack years: 38.00    Types: Cigarettes    Quit date: 2004    Years since quitting: 19.4   Smokeless tobacco: Never  Vaping Use   Vaping Use: Never used  Substance Use Topics   Alcohol use: Never   Drug use: Never       OPHTHALMIC EXAM:  Base Eye Exam     Visual Acuity (Snellen - Linear)       Right Left   Dist [place] 20/25 -1 20/25 -1   Dist ph [place] 20/20 -1 NI         Tonometry (Tonopen, 3:28 PM)       Right Left   Pressure 14 15         Pupils       Dark Light Shape React APD   Right 3 2 Round Brisk None   Left 3 2 Round Brisk None         Visual Fields (Counting fingers)       Left Right    Full Full         Extraocular Movement       Right Left    Full, Ortho Full, Ortho         Neuro/Psych     Oriented x3: Yes   Mood/Affect: Normal         Dilation     Both eyes: 1.0% Mydriacyl, 2.5% Phenylephrine @ 3:28 PM           Slit Lamp and Fundus Exam     Slit Lamp Exam       Right Left   Lids/Lashes Dermatochalasis - upper lid Dermatochalasis - upper lid   Conjunctiva/Sclera White and quiet  White and quiet   Cornea Well healed cataract wounds, sub epi scar at 1200, Arcus, trace PEE Well healed cataract wounds, Arcus, trace tear film debris, focal corneal scar / haze at 0730, trace PEE   Anterior Chamber Deep and quiet Deep and quiet   Iris Round and dilated, No NVI Round and dilated, No NVI   Lens Tecnis multifocal PC IOL in good position, open PC Tecnis multifocal PC IOL in good position, open PC   Anterior Vitreous Vitreous syneresis, Posterior vitreous detachment mild syneresis         Fundus Exam       Right Left   Disc Pink and Sharp Pink and Sharp   C/D Ratio 0.5 0.4   Macula Blunted foveal reflex, retinal drusen, Retinal pigment epithelial mottling and clumping, No heme or edema Blunted foveal reflex, Epiretinal membrane, drusen, Retinal pigment epithelial mottling and clumping, no heme or edema   Vessels attenuated, Tortuous attenuated, mild tortuosity   Periphery Attached, inferior paving stone, chorioretinal atrophy at 1030, +reticular degeneration, no heme, mild peripheral drusen Attached, scattered paving stone / chorioretinal atrophy, +reticular degeneration, no heme, mild peripheral drusen           IMAGING AND PROCEDURES  Imaging and Procedures for @TODAY @  OCT, Retina - OU - Both Eyes  Right Eye Quality was good. Central Foveal Thickness: 265. Progression has been stable. Findings include normal foveal contour, no IRF, no SRF, retinal drusen .   Left Eye Quality was good. Central Foveal Thickness: 289. Progression has been stable. Findings include normal foveal contour, no IRF, no SRF, retinal drusen , epiretinal membrane, macular pucker (Trace ERM and pucker -- stable).   Notes *Images captured and stored on drive  Diagnosis / Impression:  OD: NFP, No IRF/SRF OS: trace ERM and pucker -- stable  Clinical management:  See below  Abbreviations: NFP - Normal foveal profile. CME - cystoid macular edema. PED - pigment epithelial detachment.  IRF - intraretinal fluid. SRF - subretinal fluid. EZ - ellipsoid zone. ERM - epiretinal membrane. ORA - outer retinal atrophy. ORT - outer retinal tubulation. SRHM - subretinal hyper-reflective material             ASSESSMENT/PLAN:    ICD-10-CM   1. Diabetes mellitus type 2 without retinopathy ([place])  E11.9 OCT, Retina - OU - Both Eyes    2. Epiretinal membrane (ERM) of left eye  H35.372 OCT, Retina - OU - Both Eyes    3. Early dry stage nonexudative age-related macular degeneration of both eyes  H35.3131 OCT, Retina - OU - Both Eyes    4. Pseudophakia of both eyes  Z96.1      1. Diabetes mellitus, type 2 without retinopathy  - pt self reports A1c of 9.1 - The incidence, risk factors for progression, natural history and treatment options for diabetic retinopathy  were discussed with patient.   - The need for close monitoring of blood glucose, blood pressure, and serum lipids, avoiding cigarette or any type of tobacco, and the need for long term follow up was also discussed with patient. - f/u in 1 year, sooner prn  2. Epiretinal membrane OS -- stable - BCVA  20/25 OS -- stable - OCT shows mild ERM and pucker -- stable - not visually significant -- no surgical intervention indicated at this time - cont monitoring - F/U 1 yr, sooner prn -- DFE, OCT  3. Age related macular degeneration, non-exudative, both eyes  - The incidence, anatomy, and pathology of dry AMD, risk of progression, and the AREDS and AREDS 2 study including smoking risks discussed with patient.  - Recommend amsler grid monitoring  - f/u 1 yr  4. Pseudophakia OU  - s/p CE/IOL OU -- Tecnis multifocal IOLs by Dr. Charlynn Court  - beautiful surgeries, doing well  - monitor  Ophthalmic Meds Ordered this visit:  No orders of the defined types were placed in this encounter.    Return in about 1 year (around 06/06/2023) for f/u DM exam, DFE, OCT.  There are no Patient Instructions on file for this visit.  This  document serves as a record of services personally performed by Gardiner Sleeper, MD, PhD. It was created on their behalf by Renaldo Reel, [place] an ophthalmic technician. The creation of this record is the provider's dictation and/or activities during the visit.    Electronically signed by:  Renaldo Reel, COT  05/23/22 5:42 PM   This document serves as a record of services personally performed by Gardiner Sleeper, MD, PhD. It was created on their behalf by San Jetty. Owens Shark, OA an ophthalmic technician. The creation of this record is the provider's dictation and/or activities during the visit.    Electronically signed by: San Jetty. [place], [place] 06.20.2023 5:42 PM   Gardiner Sleeper,  M.D., Ph.D. Diseases & Surgery of the Retina and [place]  I have reviewed the above documentation for accuracy and completeness, and I agree with the above. Gardiner Sleeper, M.D., Ph.D. 06/06/22 5:42 PM  Abbreviations: M myopia (nearsighted); A astigmatism; H hyperopia (farsighted); P presbyopia; Mrx spectacle prescription;  CTL contact lenses; OD right eye; OS left eye; OU both eyes  XT exotropia; ET esotropia; PEK punctate epithelial keratitis; PEE punctate epithelial erosions; DES dry eye syndrome; MGD meibomian gland dysfunction; ATs artificial tears; PFAT's preservative free artificial tears; [place] nuclear sclerotic cataract; PSC posterior subcapsular cataract; ERM epi-retinal membrane; PVD posterior vitreous detachment; RD retinal detachment; DM diabetes mellitus; DR diabetic retinopathy; NPDR non-proliferative diabetic retinopathy; PDR proliferative diabetic retinopathy; CSME clinically significant macular edema; DME diabetic macular edema; dbh dot blot hemorrhages; CWS cotton wool spot; POAG primary open angle glaucoma; C/D cup-to-disc ratio; HVF humphrey visual field; GVF goldmann visual field; OCT optical coherence tomography; IOP intraocular pressure; BRVO Branch retinal vein  occlusion; CRVO central retinal vein occlusion; CRAO central retinal artery occlusion; BRAO branch retinal artery occlusion; RT retinal tear; SB scleral buckle; PPV pars plana vitrectomy; VH Vitreous hemorrhage; PRP panretinal laser photocoagulation; IVK intravitreal kenalog; VMT vitreomacular traction; MH Macular hole;  NVD neovascularization of the disc; NVE neovascularization elsewhere; AREDS age related eye disease study; ARMD age related macular degeneration; POAG primary open angle glaucoma; EBMD epithelial/anterior basement membrane dystrophy; ACIOL anterior chamber intraocular lens; IOL intraocular lens; PCIOL posterior chamber intraocular lens; Phaco/IOL phacoemulsification with intraocular lens placement; [place] photorefractive keratectomy; LASIK laser assisted in situ keratomileusis; HTN hypertension; DM diabetes mellitus; COPD chronic obstructive pulmonary disease

## 2022-05-30 ENCOUNTER — Other Ambulatory Visit (HOSPITAL_COMMUNITY): Payer: Self-pay

## 2022-05-30 NOTE — Telephone Encounter (Signed)
Calling insurance as it has been more than 7 days since resubmission. Pt insurance denied due to no other antibiotic tried for SIBO. Note from May encounter mentioned possibly changing to Doxycycline. No PA required if decision to change

## 2022-05-30 NOTE — Telephone Encounter (Signed)
Patient called requested to get a follow up on PA status also requested a call back so she knows.

## 2022-05-31 ENCOUNTER — Other Ambulatory Visit (HOSPITAL_COMMUNITY): Payer: Self-pay

## 2022-05-31 NOTE — Telephone Encounter (Signed)
Patient is allergic to Sulfa antibiotics.

## 2022-05-31 NOTE — Telephone Encounter (Signed)
Left message for patient to call office.  Dr. Orvan [facility] please review the alert between Doxycycline and Coumdain.

## 2022-05-31 NOTE — Telephone Encounter (Signed)
Can try Doxycycline sine the Xifaxan was denied this time? Please advise.

## 2022-05-31 NOTE — Telephone Encounter (Signed)
Please appeal decision.

## 2022-05-31 NOTE — Telephone Encounter (Signed)
Is there away to appeal the denial for Xifaxan? Patient is unable to take the 2 other antibiotic recommended in SIBO treatment.

## 2022-06-04 NOTE — Telephone Encounter (Signed)
Patient called stating she has not heard anything about getting her medication. I advised patient that we were trying to appeal the decision for Sulfa antibiotics. Patient stated she has been going back and forth with [place] and is wanting to know how much longer this is going take. Please advise.

## 2022-06-04 NOTE — Telephone Encounter (Signed)
I explained the process of trying to get a PA. And how unfortunately that she has allergies to Sulfa drugs and that she cant to the doxycyline due to  being on coumadin. She now understands we are trying an appeal. I also explained we do have a wait list for samples due to high cost and high demand. Reassured Adriana [name] that we haven't forgotten her and everyone was still working on it.

## 2022-06-05 ENCOUNTER — Encounter (INDEPENDENT_AMBULATORY_CARE_PROVIDER_SITE_OTHER): Payer: Self-pay | Admitting: Ophthalmology

## 2022-06-05 ENCOUNTER — Ambulatory Visit (INDEPENDENT_AMBULATORY_CARE_PROVIDER_SITE_OTHER): Payer: Federal, State, Local not specified - PPO | Admitting: Ophthalmology

## 2022-06-05 DIAGNOSIS — H35372 Puckering of macula, left eye: Secondary | ICD-10-CM

## 2022-06-05 DIAGNOSIS — Z961 Presence of intraocular lens: Secondary | ICD-10-CM | POA: Diagnosis not present

## 2022-06-05 DIAGNOSIS — E119 Type 2 diabetes mellitus without complications: Secondary | ICD-10-CM

## 2022-06-05 DIAGNOSIS — H353131 Nonexudative age-related macular degeneration, bilateral, early dry stage: Secondary | ICD-10-CM | POA: Diagnosis not present

## 2022-06-06 ENCOUNTER — Encounter (INDEPENDENT_AMBULATORY_CARE_PROVIDER_SITE_OTHER): Payer: Self-pay | Admitting: Ophthalmology

## 2022-06-07 ENCOUNTER — Telehealth: Payer: Self-pay

## 2022-06-07 NOTE — Telephone Encounter (Unsigned)
During assessment of insurance coverage criteria for Xifaxan I have obtained the following in relation to patient diagnosis:  Small intestinal bacterial overgrowth (SIBO) a. 74 years of age or older b. Inadequate treatment response to dietary modification (such as  low carbohydrate diet, exclusion of gas producing foods, lactose  free diet if intolerant) c. Inadequate treatment response, intolerance, or contraindication to  another antibiotic for SIBO (e.g., amoxicillin-clavulanic acid,  ciprofloxacin, metronidazole, etc.)  While the patient should meet the criteria for sections (a) and (c) I would like to know if any documentation is available of dietary modifications listed in section (b) that I may add to her appeal letter. Please advise.

## 2022-06-07 NOTE — Telephone Encounter (Signed)
Unfortunately, I have reviewed Dr. Daleen Bo office notes in Epic as well and have not seen any dietary modifications done by patient. I am sorry I could not be more help.

## 2022-06-09 NOTE — Telephone Encounter (Signed)
Received a fax regarding Prior Authorization from [employer] for XIFAXAN MG. Authorization has been DENIED because the use of this medication without an inadequate treatment response to dietary modification (such as low carbohydrate diet, exclusion of gas producing foods, lactose free diet if intolerant) .

## 2022-06-12 ENCOUNTER — Telehealth: Payer: Self-pay | Admitting: Cardiovascular Disease

## 2022-06-14 NOTE — Telephone Encounter (Signed)
Returned call to patient who states that her PCP is managing her BP meds and decreased her Metoprolol Tartrate to once daily due to hypotension and symptom of dizziness. Pt states since making the change, she can't tell any difference in her dizziness. Verified medications with patient. The numbers she has provided all sounds good, and much improved from the 96/52 that it was. Advised that she continue to check her BP daily, including her heart rate, and to maintain adequate fluid intake. She understands to contact PCP and let them know that the medication change hasn't improved her dizziness. Advised that she may need to make an adjustment to her Lisinopril dose next, but that they would advise on that. Pt denies feeling that she needs to see Dr Excell Seltzer about this "I've been dizzy for years, this isn't anything new for me." Reiterated for pt to call us if she needs [place] for any reason.

## 2022-06-14 NOTE — Telephone Encounter (Signed)
Patient informed of denial and we are waiting to get some samples in.

## 2022-06-22 NOTE — Telephone Encounter (Signed)
Patient informed samples are up front for her to pick up.

## 2022-07-13 ENCOUNTER — Ambulatory Visit: Payer: Federal, State, Local not specified - PPO | Admitting: Gastroenterology

## 2022-08-02 ENCOUNTER — Other Ambulatory Visit: Payer: Self-pay

## 2022-08-02 ENCOUNTER — Other Ambulatory Visit: Payer: Self-pay | Admitting: Cardiovascular Disease

## 2022-08-02 DIAGNOSIS — I131 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, with stage 1 through stage 4 chronic kidney disease, or unspecified chronic kidney disease: Secondary | ICD-10-CM | POA: Insufficient documentation

## 2022-08-02 DIAGNOSIS — Z8673 Personal history of transient ischemic attack (TIA), and cerebral infarction without residual deficits: Secondary | ICD-10-CM | POA: Insufficient documentation

## 2022-08-02 DIAGNOSIS — R197 Diarrhea, unspecified: Secondary | ICD-10-CM | POA: Insufficient documentation

## 2022-08-02 DIAGNOSIS — E042 Nontoxic multinodular goiter: Secondary | ICD-10-CM | POA: Insufficient documentation

## 2022-08-02 DIAGNOSIS — D6869 Other thrombophilia: Secondary | ICD-10-CM | POA: Insufficient documentation

## 2022-09-12 ENCOUNTER — Ambulatory Visit: Payer: Federal, State, Local not specified - PPO | Admitting: Gastroenterology

## 2022-09-12 ENCOUNTER — Encounter: Payer: Self-pay | Admitting: Gastroenterology

## 2022-09-12 VITALS — BP 112/70 | HR 74 | Ht 66.0 in | Wt 145.0 lb

## 2022-09-12 DIAGNOSIS — R197 Diarrhea, unspecified: Secondary | ICD-10-CM

## 2022-09-12 MED ORDER — DICYCLOMINE HCL 10 MG PO CAPS: 10.0000 mg | ORAL_CAPSULE | Freq: Three times a day (TID) | ORAL | 3 refills | Status: DC

## 2022-09-12 NOTE — Patient Instructions (Addendum)
It was a pleasure to see you today.  I have recommended a trial of dicyclomine 10 mg taken prior to each meal. I am hopeful this may reduce some of the time that you spend in the bathroom after eating.  I have additional ideas if you are still not satisfied with your bowel habits.   I'd like to see you back in 2-3 months, or earlier if needed.   We discussed the book Remark[employer]. It is precious and one of my favorite books.

## 2022-09-12 NOTE — Progress Notes (Signed)
Referring Provider: [name], MD Primary Care Physician:  [name], MD  Chief complaint:  Diarrhea   IMPRESSION:  Chronic diarrhea with nocturnal incontinence. Today described primarily as postprandial symptoms. 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 Differential includes bile acid diarrhea following prior cholecystectomy and chronic colitis including microscopic colitis.  However she is high risk for endoscopic procedures given her comorbidities and need for anticoagulation. She does not wish to have another due to the risks.  PLAN: - Daily stool bulking agent such as Metamucil or Benefiber - Add dicyclomine 10 mg QID taken prior to meals - Consider colestipol if no improvement for possible bile acid diarrhea - Office follow-up in 2-3 months, earlier if needed  HPI: Adriana [name] is a 76 y.o. female returns in follow-up.  She was last seen 05/15/22. She has now had almost one year of diarrhea with diffuse abdominal sensitivity.  She was been having 3-4 watery explosive bowel movements daily, often postprandial, multiple accidents, nocturnal symptoms, and fecal incontinence. Baseline bowel habits are 1 formed bowel movement daily.  No alarm features. The abdominal pain has largely resolved, but, the diarrhea persists.  Today she reports having at least three bowel movements after each meal. She will spend 45 minutes after each meal because she will have 3 bowel movements. Fewer accidents. No nocturnal symptoms since her last round of antibiotics.  Recent recurrent URI symptoms treated with 3 rounds of antibiotics. No obvious change in GI symptoms while on antibiotics. Illness prevented her from going to [place] on her anniversary trip.  Recent evaluation includes: - CT of the abdomen pelvis 01/15/2022 that showed diffuse diverticulosis and a large duodenal diverticula but no acute intra-abdominal or intrapelvic  processes - H. pylori stool antigen negative - Pancreatic elastase normal - C. difficile toxin negative - Fecal calprotectin normal - Normal TTG a and IgA - Normal liver enzymes - Normal CBC  Treatment trials: - 2-3 weeks probiotics may have been helpful. - Liquid PeptoBismal helps but she does not want to live off PeptoBismal - Xifaxan 550 mg TID x 14 days - Repeat of Xifaxan 550 mg TID x 14 days given nearly complete improvement with first trial  She had a history of a colonoscopy over 5 years ago in [place]. Does not wish to have another.    Past Medical History:  Diagnosis Date   AKI (acute kidney injury) (HCC) 05/02/2018   Carotid artery disease (HCC)    hx of bilat CEA // Carotid US 12/19: R 40-59; L 1-39   Cellulitis of right leg 05/01/2018   Coronary artery disease    Myoview 10/19 High Risk // LHC 11/19 w/ 3 v CAD // Echo 11/19: EF 50-55, gr 1 DD, ant-sept, inf-sept, apical HK // s/p CABG in 11/2018   Depression    denies   DVT (deep venous thrombosis) (HCC)    in setting of Factor V Leiden mutation   Essential hypertension    Factor V Leiden mutation (HCC)    Chronic Warfarin managed by PCP // hx of recurrent DVT; prior CVA   GERD (gastroesophageal reflux disease)    History of kidney stones    History of sepsis 05/01/2018   History of stroke 2004   right side weakness-resolved   HLD (hyperlipidemia)    Myocardial infarction (HCC)    Pneumonia    x2   PONV (postoperative nausea and vomiting)    blood pressure up/down, confusion  Poorly controlled type 2 diabetes mellitus (HCC)    Seizures (HCC)    Sleep apnea     Past Surgical History:  Procedure Laterality Date   ABDOMINAL HYSTERECTOMY  1989   BACK SURGERY     CARDIAC CATHETERIZATION     Carotid artery endarterectomy     right and left side with stents   CATARACT EXTRACTION Bilateral 2017   CHOLECYSTECTOMY     CORONARY ARTERY BYPASS GRAFT N/A 11/17/2018   Procedure: CORONARY ARTERY BYPASS  GRAFTING (CABG), ON PUMP, TIMES FIVE, USING LEFT INTERNAL MAMMARY ARTERY AND ENDOSCOPICALLY HARVESTED LEFT GREATER SAPHENOUS VEIN;  Surgeon: Alleen Borne, MD;  Location: [facility] OR;  Service: Open Heart Surgery;  Laterality: N/A;   LEFT HEART CATH AND CORONARY ANGIOGRAPHY N/A 11/04/2018   Procedure: LEFT HEART CATH AND CORONARY ANGIOGRAPHY;  Surgeon: [name], MD;  Location: [facility] INVASIVE CV LAB;  Service: Cardiovascular;  Laterality: N/A;   LITHOTRIPSY     x3   TEE WITHOUT CARDIOVERSION N/A 11/17/2018   Procedure: TRANSESOPHAGEAL ECHOCARDIOGRAM (TEE);  Surgeon: Alleen Borne, MD;  Location: [facility] OR;  Service: Open Heart Surgery;  Laterality: N/A;   TONSILLECTOMY     trigger fingers left and right Bilateral    TUBAL LIGATION      Current Outpatient Medications  Medication Sig Dispense Refill   ACCU-CHEK GUIDE test strip CHECK BLOOD SUGAR THREE TIMES DAILY     acetaminophen (TYLENOL) 500 MG tablet Take 500 mg by mouth every 6 (six) hours as needed.     buPROPion (WELLBUTRIN XL) 300 MG 24 hr tablet Take 300 mg by mouth daily.   2   chlorthalidone (HYGROTON) 25 MG tablet Take 25 mg by mouth daily.     Cholecalciferol (VITAMIN D) 2000 units CAPS Take 2,000 Units by mouth daily.      clobetasol cream (TEMOVATE) 0.05 % Apply 1 application topically daily as needed (for psoriasis).      Continuous Blood Gluc Sensor (FREESTYLE LIBRE 14 DAY SENSOR) MISC 1 each by Does not apply route every 14 (fourteen) days. Change every 2 weeks 2 each 11   cyanocobalamin 2000 MCG tablet Take 2,000 mcg by mouth daily.     dicyclomine (BENTYL) 10 MG capsule Take 1 capsule (10 mg total) by mouth 4 (four) times daily -  before meals and at bedtime. 120 capsule 3   Insulin Glargine (BASAGLAR KWIKPEN) 100 UNIT/ML Inject 24 Units into the skin at bedtime.     insulin lispro (HUMALOG) 100 UNIT/ML injection Inject 0.06 mLs (6 Units total) into the skin 3 (three) times daily before meals. (Patient taking differently: Inject  12 Units into the skin 3 (three) times daily before meals.) 10 mL 11   lisinopril (PRINIVIL,ZESTRIL) 5 MG tablet Take 5 mg by mouth daily.     metFORMIN (GLUCOPHAGE) 1000 MG tablet Take 1,000 mg by mouth 2 (two) times daily with a meal.     metoprolol tartrate (LOPRESSOR) 25 MG tablet Take 25 mg by mouth 2 (two) times daily.     pantoprazole (PROTONIX) 40 MG tablet Take 1 tablet (40 mg total) by mouth daily before breakfast. 90 tablet 1   potassium citrate (UROCIT-K) 10 MEQ (1080 MG) SR tablet Take 10 mEq by mouth 2 (two) times daily.     promethazine (PHENERGAN) 25 MG tablet Take 12.5 mg by mouth every evening.   1   rosuvastatin (CRESTOR) 40 MG tablet TAKE 1 TABLET(40 MG) BY MOUTH DAILY 90 tablet 3  warfarin (COUMADIN) 5 MG tablet Take by mouth See admin instructions. Take 5 mg every Monday and Friday; 2.5 mg all other days of the week     white petrolatum (VASELINE) GEL Apply 1 application topically daily as needed (for psoriasis).     PROLIA 60 MG/ML SOSY injection Inject into the skin. (Patient not taking: Reported on 09/12/2022)     rifaximin (XIFAXAN) 550 MG TABS tablet Take 1 tablet (550 mg total) by mouth 3 (three) times daily. (Patient not taking: Reported on 09/12/2022) 42 tablet 0   scopolamine (TRANSDERM-SCOP) 1 MG/3DAYS 1 patch every 3 (three) days. (Patient not taking: Reported on 09/12/2022)     No current facility-administered medications for this visit.    Allergies as of 09/12/2022 - Review Complete 09/12/2022  Allergen Reaction Noted   Aspirin Nausea And Vomiting 03/27/2018   Propofol Nausea And Vomiting 03/27/2018   Propofol Nausea And Vomiting 03/27/2018   Dapagliflozin Other (See Comments) 01/19/2022   Ibandronic acid  05/02/2021   Sulfa antibiotics  16/09/9603   Trulicity [dulaglutide]  01/17/2022      Physical Exam: Gen: Awake, alert, and oriented, and well communicative. HEENT: EOMI, non-icteric sclera, NCAT, MMM  Neck: Normal movement of head and neck  Pulm:  No labored breathing, speaking in full sentences without conversational dyspnea  Derm: No apparent lesions or bruising in visible field  MS: Moves all visible extremities without noticeable abnormality  Psych: Pleasant, cooperative, normal speech, thought processing seemingly intact      Raekwon Winkowski L. Tarri Glenn, MD, MPH 09/14/2022, 9:49 AM

## 2022-11-13 ENCOUNTER — Encounter: Payer: Self-pay | Admitting: Gastroenterology

## 2022-11-13 ENCOUNTER — Ambulatory Visit: Payer: Federal, State, Local not specified - PPO | Admitting: Gastroenterology

## 2022-11-13 VITALS — BP 108/60 | HR 94 | Ht 66.0 in | Wt 144.6 lb

## 2022-11-13 DIAGNOSIS — K529 Noninfective gastroenteritis and colitis, unspecified: Secondary | ICD-10-CM | POA: Diagnosis not present

## 2022-11-13 NOTE — Patient Instructions (Signed)
It was a pleasure to see you today.  Please take the dicyclomine 10 mg four times daily.  We discussed a talking pill book as a reminder to take the medications regularly.  I would like to see you in 3 months. Please let me know if you need anything before that time.

## 2022-11-13 NOTE — Progress Notes (Signed)
Referring Provider: [name], MD Primary Care Physician:  [name], MD  Chief complaint:  Diarrhea   IMPRESSION:  Chronic predominantly post-prandial diarrhea with nocturnal incontinence. 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 Differential includes bile acid diarrhea following prior cholecystectomy and chronic colitis including microscopic colitis.  However she is high risk for endoscopic procedures given her comorbidities and need for anticoagulation. She does not wish to have another due to the risks. We are focusing on symptomatic management.  PLAN: - Daily stool bulking agent such as Metamucil or Benefiber - Increase dicyclomine to 10 mg QID taken prior to meals - Consider colestipol if no improvement for possible bile acid diarrhea - Office follow-up in 2-3 months, earlier if needed  HPI: Adriana [name] is a 76 y.o. female returns in follow-up.  She was last seen 09/12/22 for a one year history of diarrhea with diffuse abdominal sensitivity.  She was been having 3-4 watery explosive bowel movements daily, often postprandial, multiple accidents, nocturnal symptoms, and fecal incontinence. Baseline bowel habits are 1 formed bowel movement daily.  No alarm features. The abdominal pain has largely resolved, but, the diarrhea persists.  Today she reports having at least three bowel movements after each meal. She will spend 45 minutes after each meal because she will have 3 bowel movements. Fewer accidents. No nocturnal symptoms since her last round of antibiotics.  Has been using dicyclomine BID but has difficulty remembers to use it QID. She has had one massive blow out since her last visit her. She continues to have blow outs but is able to get to the bathroom. No alarm features. No new symptoms.   Evaluation includes: - CT of the abdomen pelvis 01/15/2022 that showed diffuse diverticulosis and a large duodenal  diverticula but no acute intra-abdominal or intrapelvic processes - H. pylori stool antigen negative - Pancreatic elastase normal - C. difficile toxin negative - Fecal calprotectin normal - Normal TTG a and IgA - Normal liver enzymes - Normal CBC  Treatment trials: - 2-3 weeks probiotics may have been helpful. - Liquid PeptoBismal helps but she does not want to live off PeptoBismal - Xifaxan 550 mg TID x 14 days - Repeat of Xifaxan 550 mg TID x 14 days given nearly complete improvement with first trial - Dicyclomine BID provides some relief  She had a history of a colonoscopy over 5 years ago in [place]. Does not wish to have another.    Past Medical History:  Diagnosis Date   AKI (acute kidney injury) (HCC) 05/02/2018   Carotid artery disease (HCC)    hx of bilat CEA // Carotid US 12/19: R 40-59; L 1-39   Cellulitis of right leg 05/01/2018   Coronary artery disease    Myoview 10/19 High Risk // LHC 11/19 w/ 3 v CAD // Echo 11/19: EF 50-55, gr 1 DD, ant-sept, inf-sept, apical HK // s/p CABG in 11/2018   Depression    denies   DVT (deep venous thrombosis) (HCC)    in setting of Factor V Leiden mutation   Essential hypertension    Factor V Leiden mutation (HCC)    Chronic Warfarin managed by PCP // hx of recurrent DVT; prior CVA   GERD (gastroesophageal reflux disease)    History of kidney stones    History of sepsis 05/01/2018   History of stroke 2004   right side weakness-resolved   HLD (hyperlipidemia)    Myocardial infarction (HCC)  Pneumonia    x2   PONV (postoperative nausea and vomiting)    blood pressure up/down, confusion   Poorly controlled type 2 diabetes mellitus (HCC)    Seizures (HCC)    Sleep apnea     Past Surgical History:  Procedure Laterality Date   ABDOMINAL HYSTERECTOMY  1989   BACK SURGERY     CARDIAC CATHETERIZATION     Carotid artery endarterectomy     right and left side with stents   CATARACT EXTRACTION Bilateral 2017    CHOLECYSTECTOMY     CORONARY ARTERY BYPASS GRAFT N/A 11/17/2018   Procedure: CORONARY ARTERY BYPASS GRAFTING (CABG), ON PUMP, TIMES FIVE, USING LEFT INTERNAL MAMMARY ARTERY AND ENDOSCOPICALLY HARVESTED LEFT GREATER SAPHENOUS VEIN;  Surgeon: Alleen Borne, MD;  Location: [facility] OR;  Service: Open Heart Surgery;  Laterality: N/A;   LEFT HEART CATH AND CORONARY ANGIOGRAPHY N/A 11/04/2018   Procedure: LEFT HEART CATH AND CORONARY ANGIOGRAPHY;  Surgeon: [name], MD;  Location: [facility] INVASIVE CV LAB;  Service: Cardiovascular;  Laterality: N/A;   LITHOTRIPSY     x3   TEE WITHOUT CARDIOVERSION N/A 11/17/2018   Procedure: TRANSESOPHAGEAL ECHOCARDIOGRAM (TEE);  Surgeon: Alleen Borne, MD;  Location: [facility] OR;  Service: Open Heart Surgery;  Laterality: N/A;   TONSILLECTOMY     trigger fingers left and right Bilateral    TUBAL LIGATION      Current Outpatient Medications  Medication Sig Dispense Refill   ACCU-CHEK GUIDE test strip CHECK BLOOD SUGAR THREE TIMES DAILY     acetaminophen (TYLENOL) 500 MG tablet Take 500 mg by mouth every 6 (six) hours as needed.     buPROPion (WELLBUTRIN XL) 300 MG 24 hr tablet Take 300 mg by mouth daily.   2   chlorthalidone (HYGROTON) 25 MG tablet Take 25 mg by mouth daily.     Cholecalciferol (VITAMIN D) 2000 units CAPS Take 2,000 Units by mouth daily.      clobetasol cream (TEMOVATE) 0.05 % Apply 1 application topically daily as needed (for psoriasis).      Continuous Blood Gluc Sensor (FREESTYLE LIBRE 14 DAY SENSOR) MISC 1 each by Does not apply route every 14 (fourteen) days. Change every 2 weeks 2 each 11   cyanocobalamin 2000 MCG tablet Take 2,000 mcg by mouth daily.     dicyclomine (BENTYL) 10 MG capsule Take 1 capsule (10 mg total) by mouth 4 (four) times daily -  before meals and at bedtime. 120 capsule 3   Insulin Glargine (BASAGLAR KWIKPEN) 100 UNIT/ML Inject 24 Units into the skin at bedtime.     insulin lispro (HUMALOG) 100 UNIT/ML injection Inject 0.06 mLs  (6 Units total) into the skin 3 (three) times daily before meals. (Patient taking differently: Inject 12 Units into the skin 3 (three) times daily before meals.) 10 mL 11   lisinopril (PRINIVIL,ZESTRIL) 5 MG tablet Take 5 mg by mouth daily.     metFORMIN (GLUCOPHAGE) 1000 MG tablet Take 1,000 mg by mouth 2 (two) times daily with a meal.     metoprolol tartrate (LOPRESSOR) 25 MG tablet Take 25 mg by mouth 2 (two) times daily.     pantoprazole (PROTONIX) 40 MG tablet Take 1 tablet (40 mg total) by mouth daily before breakfast. 90 tablet 1   potassium citrate (UROCIT-K) 10 MEQ (1080 MG) SR tablet Take 10 mEq by mouth 2 (two) times daily.     PROLIA 60 MG/ML SOSY injection Inject into the skin.  promethazine (PHENERGAN) 25 MG tablet Take 12.5 mg by mouth every evening.   1   rifaximin (XIFAXAN) 550 MG TABS tablet Take 1 tablet (550 mg total) by mouth 3 (three) times daily. 42 tablet 0   rosuvastatin (CRESTOR) 40 MG tablet TAKE 1 TABLET(40 MG) BY MOUTH DAILY 90 tablet 3   scopolamine (TRANSDERM-SCOP) 1 MG/3DAYS 1 patch every 3 (three) days.     warfarin (COUMADIN) 5 MG tablet Take by mouth See admin instructions. Take 5 mg every Monday and Friday; 2.5 mg all other days of the week     white petrolatum (VASELINE) GEL Apply 1 application topically daily as needed (for psoriasis).     No current facility-administered medications for this visit.    Allergies as of 11/13/2022 - Review Complete 11/13/2022  Allergen Reaction Noted   Aspirin Nausea And Vomiting 03/27/2018   Propofol Nausea And Vomiting 03/27/2018   Propofol Nausea And Vomiting 03/27/2018   Dapagliflozin Other (See Comments) 01/19/2022   Ibandronic acid  05/02/2021   Sulfa antibiotics  08/13/2018   Trulicity [dulaglutide]  01/17/2022      Physical Exam: Gen: Awake, alert, and oriented, and well communicative. HEENT: EOMI, non-icteric sclera, NCAT, MMM  Neck: Normal movement of head and neck  Pulm: No labored breathing,  speaking in full sentences without conversational dyspnea  Derm: No apparent lesions or bruising in visible field  MS: Moves all visible extremities without noticeable abnormality  Psych: Pleasant, cooperative, normal speech, thought processing seemingly intact      Adriana [name] L. Orvan Falconer, MD, MPH 11/13/2022, 4:03 PM

## 2022-12-11 ENCOUNTER — Ambulatory Visit (INDEPENDENT_AMBULATORY_CARE_PROVIDER_SITE_OTHER): Payer: Federal, State, Local not specified - PPO | Admitting: Podiatry

## 2022-12-11 DIAGNOSIS — M79674 Pain in right toe(s): Secondary | ICD-10-CM | POA: Diagnosis not present

## 2022-12-11 DIAGNOSIS — M79675 Pain in left toe(s): Secondary | ICD-10-CM | POA: Diagnosis not present

## 2022-12-11 DIAGNOSIS — B351 Tinea unguium: Secondary | ICD-10-CM | POA: Diagnosis not present

## 2022-12-11 NOTE — Progress Notes (Signed)
  Subjective:  Patient ID: Maralyn Sago, female    DOB: 01-18-1946,  MRN: [id number]  Chief Complaint  Patient presents with   Nail Problem   76 y.o. female returns for the above complaint.  Patient presents with thickened and again dystrophic toenails x 10 mild pain on palpation hurts with ambulation worse with pressure would like to have them to be done she is not able to do it herself.  Denies any other acute complaints at this  Objective:  There were no vitals filed for this visit. Podiatric Exam: Vascular: dorsalis pedis and posterior tibial pulses are palpable bilateral. Capillary return is immediate. Temperature gradient is WNL. Skin turgor WNL  Sensorium: Normal Semmes Weinstein monofilament test. Normal tactile sensation bilaterally. Nail Exam: Pt has thick disfigured discolored nails with subungual debris noted bilateral entire nail hallux through fifth toenails.  Pain on palpation to the nails. Ulcer Exam: There is no evidence of ulcer or pre-ulcerative changes or infection. Orthopedic Exam: Muscle tone and strength are WNL. No limitations in general ROM. No crepitus or effusions noted.  Skin: No Porokeratosis. No infection or ulcers    Assessment & Plan:  No diagnosis found.  Patient was evaluated and treated and all questions answered.  Onychomycosis with pain  -Nails palliatively debrided as below. -Educated on self-care  Procedure: Nail Debridement Rationale: pain  Type of Debridement: manual, sharp debridement. Instrumentation: Nail nipper, rotary burr. Number of Nails: 10  Procedures and Treatment: Consent by patient was obtained for treatment procedures. The patient understood the discussion of treatment and procedures well. All questions were answered thoroughly reviewed. Debridement of mycotic and hypertrophic toenails, 1 through 5 bilateral and clearing of subungual debris. No ulceration, no infection noted.  Return Visit-Office Procedure: Patient  instructed to return to the office for a follow up visit 3 months for continued evaluation and treatment.  Nicholes Rough, DPM    No follow-ups on file.

## 2023-02-12 ENCOUNTER — Ambulatory Visit: Payer: Federal, State, Local not specified - PPO | Admitting: Gastroenterology

## 2023-02-15 ENCOUNTER — Other Ambulatory Visit (HOSPITAL_COMMUNITY): Payer: Self-pay | Admitting: Registered Nurse

## 2023-02-15 ENCOUNTER — Other Ambulatory Visit (HOSPITAL_COMMUNITY): Payer: Self-pay

## 2023-02-15 ENCOUNTER — Encounter (HOSPITAL_COMMUNITY): Payer: Self-pay | Admitting: Registered Nurse

## 2023-02-15 ENCOUNTER — Ambulatory Visit (HOSPITAL_BASED_OUTPATIENT_CLINIC_OR_DEPARTMENT_OTHER)
Admission: RE | Admit: 2023-02-15 | Discharge: 2023-02-15 | Disposition: A | Discharge status: Home / Self Care | Payer: Federal, State, Local not specified - PPO | Source: Ambulatory Visit | Attending: Registered Nurse | Admitting: Registered Nurse

## 2023-02-15 DIAGNOSIS — R109 Unspecified abdominal pain: Secondary | ICD-10-CM | POA: Insufficient documentation

## 2023-02-15 DIAGNOSIS — K921 Melena: Secondary | ICD-10-CM | POA: Diagnosis present

## 2023-02-18 ENCOUNTER — Encounter (HOSPITAL_COMMUNITY)
Admission: RE | Admit: 2023-02-18 | Discharge: 2023-02-18 | Disposition: A | Discharge status: Home / Self Care | Payer: Federal, State, Local not specified - PPO | Source: Ambulatory Visit | Attending: Endocrinology | Admitting: Endocrinology

## 2023-02-18 ENCOUNTER — Telehealth: Payer: Self-pay | Admitting: Gastroenterology

## 2023-02-18 DIAGNOSIS — I10 Essential (primary) hypertension: Secondary | ICD-10-CM | POA: Insufficient documentation

## 2023-02-18 DIAGNOSIS — I2510 Atherosclerotic heart disease of native coronary artery without angina pectoris: Secondary | ICD-10-CM | POA: Insufficient documentation

## 2023-02-18 DIAGNOSIS — I779 Disorder of arteries and arterioles, unspecified: Secondary | ICD-10-CM | POA: Insufficient documentation

## 2023-02-18 DIAGNOSIS — E782 Mixed hyperlipidemia: Secondary | ICD-10-CM | POA: Diagnosis present

## 2023-02-18 MED ORDER — ZOLEDRONIC ACID 5 MG/100ML IV SOLN
INTRAVENOUS | Status: AC
  Administered 2023-02-18: 5 mg via INTRAVENOUS
  Filled 2023-02-18: qty 100

## 2023-02-18 MED ORDER — ZOLEDRONIC ACID 5 MG/100ML IV SOLN: 5.0000 mg | Freq: Once | INTRAVENOUS | Status: AC

## 2023-02-18 NOTE — Telephone Encounter (Signed)
The pt has been scheduled for 03/14/23 at 130 pm with [facility] for an abnormal CT scan. She has had rectal bleeding in the past but that has resolved.  She will call back if her symptoms return

## 2023-02-18 NOTE — Telephone Encounter (Signed)
Patient is calling states when she had her CT scan they advised her to get an appointment as soon as possible with a provider her, states she cannot wait until April. Please advise

## 2023-02-25 ENCOUNTER — Encounter: Payer: Self-pay | Admitting: Physician Assistant

## 2023-02-25 ENCOUNTER — Ambulatory Visit (INDEPENDENT_AMBULATORY_CARE_PROVIDER_SITE_OTHER): Payer: Federal, State, Local not specified - PPO | Admitting: Physician Assistant

## 2023-02-25 VITALS — BP 108/64 | HR 74 | Ht 66.0 in | Wt 144.4 lb

## 2023-02-25 DIAGNOSIS — R197 Diarrhea, unspecified: Secondary | ICD-10-CM | POA: Diagnosis not present

## 2023-02-25 DIAGNOSIS — R1084 Generalized abdominal pain: Secondary | ICD-10-CM | POA: Diagnosis not present

## 2023-02-25 DIAGNOSIS — R110 Nausea: Secondary | ICD-10-CM

## 2023-02-25 DIAGNOSIS — R152 Fecal urgency: Secondary | ICD-10-CM | POA: Diagnosis not present

## 2023-02-25 DIAGNOSIS — R935 Abnormal findings on diagnostic imaging of other abdominal regions, including retroperitoneum: Secondary | ICD-10-CM | POA: Diagnosis not present

## 2023-02-25 MED ORDER — COLESTIPOL HCL 1 G PO TABS: 2.0000 g | ORAL_TABLET | Freq: Two times a day (BID) | ORAL | 0 refills | Status: DC

## 2023-02-25 NOTE — Patient Instructions (Addendum)
_______________________________________________________  If your blood pressure at your visit was 140/90 or greater, please contact your primary care physician to follow up on this.  _______________________________________________________  If you are age 75 or older, your body mass index should be between 23-30. Your Body mass index is 23.3 kg/m. If this is out of the aforementioned range listed, please consider follow up with your Primary Care Provider.  If you are age 24 or younger, your body mass index should be between 19-25. Your Body mass index is 23.3 kg/m. If this is out of the aformentioned range listed, please consider follow up with your Primary Care Provider.   ________________________________________________________  The [facility] providers would like to encourage you to use [facility] to communicate with providers for non-urgent requests or questions.  Due to long hold times on the telephone, sending your provider a message by [facility] may be a faster and more efficient way to get a response.  Please allow 48 business hours for a response.  Please remember that this is for non-urgent requests.  _______________________________________________________  We have sent the following medications to your pharmacy for you to pick up at your convenience: Colestipol take twice daily  Stop Dicyclomine   Start Colestipol 2 grams twice daily   Please contact your cardiovascular doctor to follow up on your abnormal ct abdominal aorta. [phone]

## 2023-02-25 NOTE — Progress Notes (Signed)
Office Visit    Patient Name: Adriana [name] Date of Encounter: 02/26/2023  PCP:  [name], [facility]  Cardiologist:  Sherren Mocha, MD  Advanced Practice Provider:  No care team member to display Electrophysiologist:  None    HPI    Adriana [name] is a 77 y.o. female with a past medical history of CAD status post CABG 11/2018 with Dr. Cyndia Bent presents today for follow-up visit.  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  Her nuclear stress test was high risk and cardiac catheterization demonstrated significant 3 vessel CAD.  Underwent CABG in 2019.  Anginal equivalent is "heartburn" which she had not experienced since undergoing CABG.  Patient was doing very well.  Does not exercise but has no symptoms at her level of activity.  Reported no recent medication changes at her last visit with Dr. Burt Knack 01/17/2022.  Blood pressure and diabetes were well-controlled at that time.  Today, she tells me that she is throwing up with stomach issues and diarrhea. This has been going on for almost a year. She is going to the bathroom 12 times a day, she went to the pcp and got a CT of the abdomen. She has some labs and her kidney numbers were elevated but it turns out she was dehydrated. They held off on referring her to a nephrologist. She is seen by [facility]. She tells me that las night she had really bad heartburn. She did not think it was her heart because her GI doctors are adjusting her medication, however this was the first time it happened in a while. Her primary wanted [place] to review her most recent CT scan from earlier this month which showed, "Moderate calcified plaque along the aorta and branch vessels. There is suggestion of significant stenosis  along the extreme distal abdominal aorta. On the prior the patent lumen narrows to a diameter approaching 4 mm. Please correlate with symptoms and recommend further evaluation when appropriate".  Otherwise, she is doing well. Her cholesterol and BP are [name] well controlled. We added nitro to her medications today to take as needed. She has follow-up with GI arranged.   Reports no shortness of breath nor dyspnea on exertion. Reports no chest pain, pressure, or tightness. No edema, orthopnea, PND. Reports no palpitations.    Past Medical History    Past Medical History:  Diagnosis Date   AKI (acute kidney injury) ([place]) 05/02/2018   Carotid artery disease ([place])    hx of bilat CEA // Carotid US 12/19: R 40-59; L 1-39   Cellulitis of right leg 05/01/2018   Coronary artery disease    Myoview 10/19 High Risk // LHC 11/19 w/ 3 v CAD // Echo 11/19: EF 50-55, gr 1 DD, ant-sept, inf-sept, apical HK // s/p CABG in 11/2018   Depression    denies   DVT (deep venous thrombosis) (HCC)    in setting of Factor V Leiden mutation   Essential hypertension    Factor V Leiden mutation ([place])    Chronic Warfarin managed by PCP // hx of recurrent DVT; prior CVA   GERD (gastroesophageal reflux disease)    History of kidney stones    History of sepsis 05/01/2018   History of stroke 2004   right side weakness-resolved   HLD (hyperlipidemia)  Myocardial infarction (HCC)    Pneumonia    x2   PONV (postoperative nausea and vomiting)    blood pressure up/down, confusion   Poorly controlled type 2 diabetes mellitus (HCC)    Seizures ([place])    Sleep apnea    Past Surgical History:  Procedure Laterality Date   ABDOMINAL HYSTERECTOMY  1989   BACK SURGERY     CARDIAC CATHETERIZATION     Carotid artery endarterectomy     right and left side with stents   CATARACT EXTRACTION Bilateral 2017   CHOLECYSTECTOMY     CORONARY ARTERY BYPASS GRAFT N/A 11/17/2018   Procedure: CORONARY ARTERY BYPASS GRAFTING (CABG),  ON PUMP, TIMES FIVE, USING LEFT INTERNAL MAMMARY ARTERY AND ENDOSCOPICALLY HARVESTED LEFT GREATER SAPHENOUS VEIN;  Surgeon: Gaye Pollack, MD;  Location: [facility];  Service: Open Heart Surgery;  Laterality: N/A;   LEFT HEART CATH AND CORONARY ANGIOGRAPHY N/A 11/04/2018   Procedure: LEFT HEART CATH AND CORONARY ANGIOGRAPHY;  Surgeon: Sherren Mocha, MD;  Location: [facility];  Service: Cardiovascular;  Laterality: N/A;   LITHOTRIPSY     x3   TEE WITHOUT CARDIOVERSION N/A 11/17/2018   Procedure: TRANSESOPHAGEAL ECHOCARDIOGRAM (TEE);  Surgeon: Gaye Pollack, MD;  Location: [place] [facility];  Service: Open Heart Surgery;  Laterality: N/A;   TONSILLECTOMY     trigger fingers left and right Bilateral    TUBAL LIGATION      Allergies  Allergies  Allergen Reactions   Aspirin Nausea And Vomiting   Propofol Nausea And Vomiting    Confused BP goes up and down.   Propofol Nausea And Vomiting    Confused BP goes up and down.   Dapagliflozin Other (See Comments)    "causes UTIs"     Ibandronic Acid    Sulfa Antibiotics     Other reaction(s): nausea   Trulicity [Dulaglutide]     EKGs/Labs/Other Studies Reviewed:   The following studies were reviewed today:  Carotid artery [place] 02/20/22   Summary:  Right Carotid: Velocities in the right ICA are consistent with a 40-59%                 stenosis. Non-hemodynamically significant plaque <50% noted  in                the CCA.   Left Carotid: Velocities in the left ICA are consistent with a 1-39%  stenosis.               Non-hemodynamically significant plaque <50% noted in the  CCA.               A non-vascular, cystic structure was visualized at the base  of the                neck measuring 4.1 x 2.7 cm. Suggest dedicated neck/thyroid                ultrasound for further evaluation.   Vertebrals:  Bilateral vertebral arteries demonstrate antegrade flow.  Subclavians: Left subclavian artery was stenotic. Normal flow hemodynamics  were               seen in the right subclavian artery.   *See table(s) above for measurements and observations.  Suggest follow up study in 12 months.    Electronically signed by Berniece Salines DO on 02/20/2022 at 4:53:57 PM.     EKG:  EKG is normal sinus rhythm, rate 66 bpm, nonspecific intraventricular block ordered today.  Recent Labs: No results found for requested labs within last 365 days.  Recent Lipid Panel    Component Value Date/Time   CHOL 123 11/14/2020 1614   TRIG 154 (H) 11/14/2020 1614   HDL 47 11/14/2020 1614   CHOLHDL 2.6 11/14/2020 1614   CHOLHDL 2.7 05/05/2018 0316   VLDL 19 05/05/2018 0316   LDLCALC 50 11/14/2020 1614    Home Medications   Current Meds  Medication Sig   ACCU-CHEK GUIDE test strip CHECK BLOOD SUGAR THREE TIMES DAILY   acetaminophen (TYLENOL) 500 MG tablet Take 500 mg by mouth every 6 (six) hours as needed.   buPROPion (WELLBUTRIN XL) 300 MG 24 hr tablet Take 300 mg by mouth daily.    chlorthalidone (HYGROTON) 25 MG tablet Take 25 mg by mouth daily.   Cholecalciferol (VITAMIN D) 2000 units CAPS Take 2,000 Units by mouth daily.    clobetasol cream (TEMOVATE) [id number] % Apply 1 application topically daily as needed (for psoriasis).    colestipol (COLESTID) 1 g tablet Take 2 tablets (2 g total) by mouth 2 (two) times daily.   Continuous Blood Gluc Sensor (FREESTYLE LIBRE 14 DAY SENSOR) MISC 1 each by Does not apply route every 14 (fourteen) days. Change every 2 weeks   cyanocobalamin 2000 MCG tablet Take 2,000 mcg by mouth daily.   Insulin Glargine (BASAGLAR KWIKPEN) 100 UNIT/ML Inject 24 Units into the skin at bedtime.   insulin lispro (HUMALOG) 100 UNIT/ML injection Inject 0.06 mLs (6 Units total) into the skin 3 (three) times daily before meals. (Patient taking differently: Inject 12 Units into the skin 3 (three) times daily before meals.)   lisinopril (PRINIVIL,ZESTRIL) 5 MG tablet Take 5 mg by mouth daily.   metFORMIN (GLUCOPHAGE-XR) 750 MG 24 hr  tablet Take 750 mg by mouth 2 (two) times daily.   metoprolol tartrate (LOPRESSOR) 25 MG tablet Take 25 mg by mouth 2 (two) times daily.   nitroGLYCERIN (NITROSTAT) 0.4 MG SL tablet Place 1 tablet (0.4 mg total) under the tongue every 5 (five) minutes as needed for chest pain.   potassium citrate (UROCIT-K) 10 MEQ (1080 MG) SR tablet Take 10 mEq by mouth 2 (two) times daily.   PROLIA 60 MG/ML SOSY injection Inject into the skin.   promethazine (PHENERGAN) 25 MG tablet Take 12.5 mg by mouth every evening.    rifaximin (XIFAXAN) 550 MG TABS tablet Take 1 tablet (550 mg total) by mouth 3 (three) times daily.   rosuvastatin (CRESTOR) 40 MG tablet TAKE 1 TABLET(40 MG) BY MOUTH DAILY   warfarin (COUMADIN) 5 MG tablet Take by mouth See admin instructions. Take 5 mg every Monday and Friday; 2.5 mg all other days of the week   white petrolatum (VASELINE) GEL Apply 1 application topically daily as needed (for psoriasis).   [DISCONTINUED] metFORMIN (GLUCOPHAGE) 1000 MG tablet Take 1,000 mg by mouth 2 (two) times daily with a meal.     Review of Systems      All other systems reviewed and are otherwise negative except as noted above.  Physical Exam    VS:  BP 120/60   Pulse 72   Ht '5\' 6"'$  (1.676 m)   Wt 143 lb 10.1 oz (65.2 kg)   SpO2 93%   BMI 23.18 kg/m  , BMI Body mass index is 23.18 kg/m.  Wt Readings from Last 3 Encounters:  02/26/23 143 lb 10.1 oz (65.2 kg)  02/25/23 144 lb 6 oz (65.5 kg)  11/13/22 144 lb 9.6 oz (  65.6 kg)     GEN: Well nourished, well developed, in no acute distress. HEENT: normal. Neck: Supple, no JVD, carotid bruits, or masses. Cardiac: RRR, no murmurs, rubs, or gallops. No clubbing, cyanosis, edema.  Radials/PT 2+ and equal bilaterally.  Respiratory:  Respirations regular and unlabored, clear to auscultation bilaterally. GI: Soft, nontender, nondistended. MS: No deformity or atrophy. Skin: Warm and dry, no rash. Neuro:  Strength and sensation are intact. Psych:  Normal affect.  Assessment & Plan    Bilateral carotid artery disease -update [place]  -no bruit on exam today  Coronary artery disease -she did have some reflux symptoms last night which are concerning for angina -might be worth arranging for a Lexiscan to r/o re-stenosis, will discuss with Dr. Burt Knack  Mixed HLD -LDL 38 -continue crestor '40mg'$  daily  Hypertension  -well controlled today -Continue chlorthalidone 25 mg daily, lisinopril 5 mg daily, metoprolol 25 mg twice a day -continue to keep track of BP at home        Disposition: Follow up 6 month with Sherren Mocha, MD or APP.  Signed, Elgie Collard, PA-C 02/26/2023, 9:50 AM [place]

## 2023-02-26 ENCOUNTER — Encounter: Payer: Self-pay | Admitting: Physician Assistant

## 2023-02-26 ENCOUNTER — Encounter (INDEPENDENT_AMBULATORY_CARE_PROVIDER_SITE_OTHER): Payer: Federal, State, Local not specified - PPO | Admitting: Physician Assistant

## 2023-02-26 VITALS — BP 120/60 | HR 72 | Ht 66.0 in | Wt 143.6 lb

## 2023-02-26 DIAGNOSIS — E782 Mixed hyperlipidemia: Secondary | ICD-10-CM | POA: Diagnosis not present

## 2023-02-26 DIAGNOSIS — I2510 Atherosclerotic heart disease of native coronary artery without angina pectoris: Secondary | ICD-10-CM

## 2023-02-26 DIAGNOSIS — I10 Essential (primary) hypertension: Secondary | ICD-10-CM | POA: Diagnosis not present

## 2023-02-26 DIAGNOSIS — I779 Disorder of arteries and arterioles, unspecified: Secondary | ICD-10-CM

## 2023-02-26 MED ORDER — NITROGLYCERIN 0.4 MG SL SUBL: 0.4000 mg | SUBLINGUAL_TABLET | SUBLINGUAL | 3 refills | Status: AC | PRN

## 2023-02-26 NOTE — Patient Instructions (Signed)
Medication Instructions:  1.Start sublingual nitroglycerin 0.4 mg, place one tablet under the tongue every 5 minutes as needed for chest pain, max 3 doses, if no relief call 911 *If you need a refill on your cardiac medications before your next appointment, please call your pharmacy*  Lab Work: None If you have labs (blood work) drawn today and your tests are completely normal, you will receive your results only by: [place] (if you have MyChart) OR A paper copy in the mail If you have any lab test that is abnormal or we need to change your treatment, we will call you to review the results.  Follow-Up: At [facility], you and your health needs are our priority.  As part of our continuing mission to provide you with exceptional heart care, we have created designated Provider Care Teams.  These Care Teams include your primary Cardiologist (physician) and Advanced Practice Providers (APPs -  Physician Assistants and Nurse Practitioners) who all work together to provide you with the care you need, when you need it.  Your next appointment:   6 month(s)  Provider:   Sherren Mocha, MD    Low-Sodium Eating Plan Sodium, which is an element that makes up salt, helps you maintain a healthy balance of fluids in your body. Too much sodium can increase your blood pressure and cause fluid and waste to be held in your body. Your health care provider or dietitian may recommend following this plan if you have high blood pressure (hypertension), kidney disease, liver disease, or heart failure. Eating less sodium can help lower your blood pressure, reduce swelling, and protect your heart, liver, and kidneys. What are tips for following this plan? Reading food labels The Nutrition Facts label lists the amount of sodium in one serving of the food. If you eat more than one serving, you must multiply the listed amount of sodium by the number of servings. Choose foods with less than 140 mg of  sodium per serving. Avoid foods with 300 mg of sodium or more per serving. Shopping  Look for lower-sodium products, often labeled as "low-sodium" or "no salt added." Always check the sodium content, even if foods are labeled as "unsalted" or "no salt added." Buy fresh foods. Avoid canned foods and pre-made or frozen meals. Avoid canned, cured, or processed meats. Buy breads that have less than 80 mg of sodium per slice. Cooking  Eat more home-cooked food and less restaurant, buffet, and fast food. Avoid adding salt when cooking. Use salt-free seasonings or herbs instead of table salt or sea salt. Check with your health care provider or pharmacist before using salt substitutes. Cook with plant-based oils, such as canola, sunflower, or olive oil. Meal planning When eating at a restaurant, ask that your food be prepared with less salt or no salt, if possible. Avoid dishes labeled as brined, pickled, cured, smoked, or made with soy sauce, miso, or teriyaki sauce. Avoid foods that contain MSG (monosodium glutamate). MSG is sometimes added to [place] food, bouillon, and some canned foods. Make meals that can be grilled, baked, poached, roasted, or steamed. These are generally made with less sodium. General information Most people on this plan should limit their sodium intake to 1,500-2,000 mg (milligrams) of sodium each day. What foods should I eat? Fruits Fresh, frozen, or canned fruit. Fruit juice. Vegetables Fresh or frozen vegetables. "No salt added" canned vegetables. "No salt added" tomato sauce and paste. Low-sodium or reduced-sodium tomato and vegetable juice. Grains Low-sodium cereals, including  oats, puffed wheat and rice, and shredded wheat. Low-sodium crackers. Unsalted rice. Unsalted pasta. Low-sodium bread. Whole-grain breads and whole-grain pasta. Meats and other proteins Fresh or frozen (no salt added) meat, poultry, seafood, and fish. Low-sodium canned tuna and salmon.  Unsalted nuts. Dried peas, beans, and lentils without added salt. Unsalted canned beans. Eggs. Unsalted nut butters. Dairy Milk. Soy milk. Cheese that is naturally low in sodium, such as ricotta cheese, fresh mozzarella, or Swiss cheese. Low-sodium or reduced-sodium cheese. Cream cheese. Yogurt. Seasonings and condiments Fresh and dried herbs and spices. Salt-free seasonings. Low-sodium mustard and ketchup. Sodium-free salad dressing. Sodium-free light mayonnaise. Fresh or refrigerated horseradish. Lemon juice. Vinegar. Other foods Homemade, reduced-sodium, or low-sodium soups. Unsalted popcorn and pretzels. Low-salt or salt-free chips. The items listed above may not be a complete list of foods and beverages you can eat. Contact a dietitian for more information. What foods should I avoid? Vegetables Sauerkraut, pickled vegetables, and relishes. Olives. [place] fries. Onion rings. Regular canned vegetables (not low-sodium or reduced-sodium). Regular canned tomato sauce and paste (not low-sodium or reduced-sodium). Regular tomato and vegetable juice (not low-sodium or reduced-sodium). Frozen vegetables in sauces. Grains Instant hot cereals. Bread stuffing, pancake, and biscuit mixes. Croutons. Seasoned rice or pasta mixes. Noodle soup cups. Boxed or frozen macaroni and cheese. Regular salted crackers. Self-rising flour. Meats and other proteins Meat or fish that is salted, canned, smoked, spiced, or pickled. Precooked or cured meat, such as sausages or meat loaves. [place] Salines. Ham. Pepperoni. Hot dogs. Corned beef. Chipped beef. Salt pork. Jerky. Pickled herring. Anchovies and sardines. Regular canned tuna. Salted nuts. Dairy Processed cheese and cheese spreads. Hard cheeses. Cheese curds. Blue cheese. Feta cheese. String cheese. Regular cottage cheese. Buttermilk. Canned milk. Fats and oils Salted butter. Regular margarine. Ghee. Bacon fat. Seasonings and condiments Onion salt, garlic salt, seasoned  salt, table salt, and sea salt. Canned and packaged gravies. Worcestershire sauce. Tartar sauce. Barbecue sauce. Teriyaki sauce. Soy sauce, including reduced-sodium. Steak sauce. Fish sauce. Oyster sauce. Cocktail sauce. Horseradish that you find on the shelf. Regular ketchup and mustard. Meat flavorings and tenderizers. Bouillon cubes. Hot sauce. Pre-made or packaged marinades. Pre-made or packaged taco seasonings. Relishes. Regular salad dressings. Salsa. Other foods Salted popcorn and pretzels. Corn chips and puffs. Potato and tortilla chips. Canned or dried soups. Pizza. Frozen entrees and pot pies. The items listed above may not be a complete list of foods and beverages you should avoid. Contact a dietitian for more information. Summary Eating less sodium can help lower your blood pressure, reduce swelling, and protect your heart, liver, and kidneys. Most people on this plan should limit their sodium intake to 1,500-2,000 mg (milligrams) of sodium each day. Canned, boxed, and frozen foods are high in sodium. Restaurant foods, fast foods, and pizza are also very high in sodium. You also get sodium by adding salt to food. Try to cook at home, eat more fresh fruits and vegetables, and eat less fast food and canned, processed, or prepared foods. This information is not intended to replace advice given to you by your health care provider. Make sure you discuss any questions you have with your health care provider. Document Revised: 11/09/2019 Document Reviewed: 11/04/2019 Elsevier Patient Education  [place] Many factors influence your heart health, including eating and exercise habits. Heart health is also called coronary health. Coronary risk increases with abnormal blood fat (lipid) levels. A heart-healthy eating plan includes limiting unhealthy fats, increasing healthy fats, limiting  salt (sodium) intake, and making other diet and lifestyle changes. What is my  plan? Your health care provider may recommend that: You limit your fat intake to _________% or less of your total calories each day. You limit your saturated fat intake to _________% or less of your total calories each day. You limit the amount of cholesterol in your diet to less than _________ mg per day. You limit the amount of sodium in your diet to less than _________ mg per day. What are tips for following this plan? Cooking Cook foods using methods other than frying. Baking, boiling, grilling, and broiling are all good options. Other ways to reduce fat include: Removing the skin from poultry. Removing all visible fats from meats. Steaming vegetables in water or broth. Meal planning  At meals, imagine dividing your plate into fourths: Fill one-half of your plate with vegetables and green salads. Fill one-fourth of your plate with whole grains. Fill one-fourth of your plate with lean protein foods. Eat 2-4 cups of vegetables per day. One cup of vegetables equals 1 cup (91 g) broccoli or cauliflower florets, 2 medium carrots, 1 large bell pepper, 1 large sweet potato, 1 large tomato, 1 medium white potato, 2 cups (150 g) raw leafy greens. Eat 1-2 cups of fruit per day. One cup of fruit equals 1 small apple, 1 large banana, 1 cup (237 g) mixed fruit, 1 large orange,  cup (82 g) dried fruit, 1 cup (240 mL) 100% fruit juice. Eat more foods that contain soluble fiber. Examples include apples, broccoli, carrots, beans, peas, and barley. Aim to get 25-30 g of fiber per day. Increase your consumption of legumes, nuts, and seeds to 4-5 servings per week. One serving of dried beans or legumes equals  cup (90 g) cooked, 1 serving of nuts is  oz (12 almonds, 24 pistachios, or 7 walnut halves), and 1 serving of seeds equals  oz (8 g). Fats Choose healthy fats more often. Choose monounsaturated and polyunsaturated fats, such as olive and canola oils, avocado oil, flaxseeds, walnuts, almonds, and  seeds. Eat more omega-3 fats. Choose salmon, mackerel, sardines, tuna, flaxseed oil, and ground flaxseeds. Aim to eat fish at least 2 times each week. Check food labels carefully to identify foods with trans fats or high amounts of saturated fat. Limit saturated fats. These are found in animal products, such as meats, butter, and cream. Plant sources of saturated fats include palm oil, palm kernel oil, and coconut oil. Avoid foods with partially hydrogenated oils in them. These contain trans fats. Examples are stick margarine, some tub margarines, cookies, crackers, and other baked goods. Avoid fried foods. General information Eat more home-cooked food and less restaurant, buffet, and fast food. Limit or avoid alcohol. Limit foods that are high in added sugar and simple starches such as foods made using white refined flour (white breads, pastries, sweets). Lose weight if you are overweight. Losing just 5-10% of your body weight can help your overall health and prevent diseases such as diabetes and heart disease. Monitor your sodium intake, especially if you have high blood pressure. Talk with your health care provider about your sodium intake. Try to incorporate more vegetarian meals weekly. What foods should I eat? Fruits All fresh, canned (in natural juice), or frozen fruits. Vegetables Fresh or frozen vegetables (raw, steamed, roasted, or grilled). Green salads. Grains Most grains. Choose whole wheat and whole grains most of the time. Rice and pasta, including brown rice and pastas made with whole wheat.  Meats and other proteins Lean, well-trimmed beef, veal, pork, and lamb. Chicken and [place] without skin. All fish and shellfish. Wild duck, rabbit, pheasant, and venison. Egg whites or low-cholesterol egg substitutes. Dried beans, peas, lentils, and tofu. Seeds and most nuts. Dairy Low-fat or nonfat cheeses, including ricotta and mozzarella. Skim or 1% milk (liquid, powdered, or  evaporated). Buttermilk made with low-fat milk. Nonfat or low-fat yogurt. Fats and oils Non-hydrogenated (trans-free) margarines. Vegetable oils, including soybean, sesame, sunflower, olive, avocado, peanut, safflower, corn, canola, and cottonseed. Salad dressings or mayonnaise made with a vegetable oil. Beverages Water (mineral or sparkling). Coffee and tea. Unsweetened ice tea. Diet beverages. Sweets and desserts Sherbet, gelatin, and fruit ice. Small amounts of dark chocolate. Limit all sweets and desserts. Seasonings and condiments All seasonings and condiments. The items listed above may not be a complete list of foods and beverages you can eat. Contact a dietitian for more options. What foods should I avoid? Fruits Canned fruit in heavy syrup. Fruit in cream or butter sauce. Fried fruit. Limit coconut. Vegetables Vegetables cooked in cheese, cream, or butter sauce. Fried vegetables. Grains Breads made with saturated or trans fats, oils, or whole milk. Croissants. Sweet rolls. Donuts. High-fat crackers, such as cheese crackers and chips. Meats and other proteins Fatty meats, such as hot dogs, ribs, sausage, bacon, rib-eye roast or steak. High-fat deli meats, such as salami and bologna. Caviar. Domestic duck and goose. Organ meats, such as liver. Dairy Cream, sour cream, cream cheese, and creamed cottage cheese. Whole-milk cheeses. Whole or 2% milk (liquid, evaporated, or condensed). Whole buttermilk. Cream sauce or high-fat cheese sauce. Whole-milk yogurt. Fats and oils Meat fat, or shortening. Cocoa butter, hydrogenated oils, palm oil, coconut oil, palm kernel oil. Solid fats and shortenings, including bacon fat, salt pork, lard, and butter. Nondairy cream substitutes. Salad dressings with cheese or sour cream. Beverages Regular sodas and any drinks with added sugar. Sweets and desserts Frosting. Pudding. Cookies. Cakes. Pies. Milk chocolate or white chocolate. Buttered syrups.  Full-fat ice cream or ice cream drinks. The items listed above may not be a complete list of foods and beverages to avoid. Contact a dietitian for more information. Summary Heart-healthy meal planning includes limiting unhealthy fats, increasing healthy fats, limiting salt (sodium) intake and making other diet and lifestyle changes. Lose weight if you are overweight. Losing just 5-10% of your body weight can help your overall health and prevent diseases such as diabetes and heart disease. Focus on eating a balance of foods, including fruits and vegetables, low-fat or nonfat dairy, lean protein, nuts and legumes, whole grains, and heart-healthy oils and fats. This information is not intended to replace advice given to you by your health care provider. Make sure you discuss any questions you have with your health care provider. Document Revised: 01/08/2022 Document Reviewed: 01/08/2022 Elsevier Patient Education  [place].

## 2023-02-26 NOTE — Progress Notes (Addendum)
Subjective:    Patient ID: Adriana [name], female    DOB: 1946/05/13, 77 y.o.   MRN: [id number]  HPI  Adriana [name] is a pleasant 77 year old white female, established with Dr. Tarri Glenn, who is referred back by Dr. Baldwin Crown office regarding abnormal CT of the abdomen and pelvis done on 02/15/2023. She had last been seen here in November 2023 per Dr. Tarri Glenn.  She has had ongoing fairly chronic issues with diarrhea which she says has persisted and has been present for years at this point She has in the past been given a trial of Xifaxan for suspected SIBO in the setting of a known duodenal diverticulum.  Patient says that did not seem to make any difference.  She has tried over-the-counter Imodium etc. in the past without any benefit.  She has been on dicyclomine 10 mg 4 times daily over the past many months and does not feel like that is helping her symptoms either.  Decision has been made not to pursue further endoscopic evaluation given multiple comorbidities and need for chronic anticoagulation. Patient has history of CVA, coronary artery disease status post CABG x 5, atrial fibrillation, factor V Leyden mutation, adult onset diabetes mellitus, chronic GERD, peripheral vascular disease, history of carotid stenosis, chronic kidney disease stage III.  She is on chronic anticoagulation with Coumadin. Also note patient takes metformin 1000 mg twice daily. She underwent a CT of the abdomen and pelvis without IV contrast on 02/15/2023 due to concerns for possible diverticulitis, complaints of abdominal pain and blood in stool.  She is noticed to have fatty liver infiltration, diffuse diverticulosis greatest in the sigmoid region but no evidence of diverticulitis.  There is extensive calcified plaque greatest along the extreme distal abdominal aorta and iliac arteries, and scattered tiny nodes in the mesentery many with components of calcification which are unchanged from the prior exam.  Report reads there is  suggestion of significant stenosis along the extreme distal abdominal aorta on the prior the patent lumen narrowed to a diameter approaching 4 mm, iliac stenosis also seen, preserved IVC, no discrete abnormal lymph node enlargement.  Patient reports that around the time that she had the CT scan her husband had been diagnosed with the flu and she had been given a preventative antiviral which she says she only took for a couple of days and then noticed that her diarrhea was significantly worse so she stopped it.  She says overall she has had loose stools for at least a year but acutely after she had taken the antiviral she had up to 12 bowel movements per day of loose to liquid stool nonbloody.  But did notice some blood in the commode on 2 occasions during that episode.  She has not seen any further blood in the stool over the past week, she says that her abdomen generally is uncomfortable in the mid abdomen she vomits easily which is chronic for her and usually stays nauseated.  Her usual pattern is to have diarrhea within an hour of eating and then frequently has urgency after that with repeated trips back to the bathroom sometimes not passing any further stool.  Currently not having any nocturnal episodes of diarrhea.  She does complain of cramping in her abdomen after eating most meals as well. Interestingly today she says she had an almost normal bowel movement for the first time in many months. I do not have copies of her most recent labs from Dr. Baldwin Crown office but she said that she  had labs done about 10 days ago that had showed that her creatinine was mildly elevated and then this was repeated again last Friday and renal function was much improved.  Her weight overall has been stable.  Review of Systems Pertinent positive and negative review of systems were noted in the above HPI section.  All other review of systems was otherwise negative.   Outpatient Encounter Medications as of 02/25/2023   Medication Sig   ACCU-CHEK GUIDE test strip CHECK BLOOD SUGAR THREE TIMES DAILY   acetaminophen (TYLENOL) 500 MG tablet Take 500 mg by mouth every 6 (six) hours as needed.   buPROPion (WELLBUTRIN XL) 300 MG 24 hr tablet Take 300 mg by mouth daily.    chlorthalidone (HYGROTON) 25 MG tablet Take 25 mg by mouth daily.   Cholecalciferol (VITAMIN D) 2000 units CAPS Take 2,000 Units by mouth daily.    clobetasol cream (TEMOVATE) [id number] % Apply 1 application topically daily as needed (for psoriasis).    colestipol (COLESTID) 1 g tablet Take 2 tablets (2 g total) by mouth 2 (two) times daily.   Continuous Blood Gluc Sensor (FREESTYLE LIBRE 14 DAY SENSOR) MISC 1 each by Does not apply route every 14 (fourteen) days. Change every 2 weeks   cyanocobalamin 2000 MCG tablet Take 2,000 mcg by mouth daily.   dicyclomine (BENTYL) 10 MG capsule Take 1 capsule (10 mg total) by mouth 4 (four) times daily -  before meals and at bedtime. (Patient not taking: Reported on 02/26/2023)   Insulin Glargine (BASAGLAR KWIKPEN) 100 UNIT/ML Inject 24 Units into the skin at bedtime.   insulin lispro (HUMALOG) 100 UNIT/ML injection Inject 0.06 mLs (6 Units total) into the skin 3 (three) times daily before meals. (Patient taking differently: Inject 12 Units into the skin 3 (three) times daily before meals.)   lisinopril (PRINIVIL,ZESTRIL) 5 MG tablet Take 5 mg by mouth daily.   metoprolol tartrate (LOPRESSOR) 25 MG tablet Take 25 mg by mouth 2 (two) times daily.   potassium citrate (UROCIT-K) 10 MEQ (1080 MG) SR tablet Take 10 mEq by mouth 2 (two) times daily.   PROLIA 60 MG/ML SOSY injection Inject into the skin.   promethazine (PHENERGAN) 25 MG tablet Take 12.5 mg by mouth every evening.    rosuvastatin (CRESTOR) 40 MG tablet TAKE 1 TABLET(40 MG) BY MOUTH DAILY   warfarin (COUMADIN) 5 MG tablet Take by mouth See admin instructions. Take 5 mg every Monday and Friday; 2.5 mg all other days of the week   white petrolatum (VASELINE)  GEL Apply 1 application topically daily as needed (for psoriasis).   [DISCONTINUED] metFORMIN (GLUCOPHAGE) 1000 MG tablet Take 1,000 mg by mouth 2 (two) times daily with a meal.   rifaximin (XIFAXAN) 550 MG TABS tablet Take 1 tablet (550 mg total) by mouth 3 (three) times daily.   [DISCONTINUED] pantoprazole (PROTONIX) 40 MG tablet Take 1 tablet (40 mg total) by mouth daily before breakfast.   [DISCONTINUED] scopolamine (TRANSDERM-SCOP) 1 MG/3DAYS 1 patch every 3 (three) days.   No facility-administered encounter medications on file as of 02/25/2023.   Allergies  Allergen Reactions   Aspirin Nausea And Vomiting   Propofol Nausea And Vomiting    Confused BP goes up and down.   Propofol Nausea And Vomiting    Confused BP goes up and down.   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, with stage 1 through stage 4 chronic kidney disease, or unspecified chronic kidney disease 08/02/2022   Multinodular goiter 08/02/2022   Atrial fibrillation, transient ([place]) 09/15/2020   OSA (obstructive sleep apnea) 07/18/2020   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 07/18/2020   Chronic kidney disease, stage 3b ([place]) 12/28/2019   Hyperkalemia 05/22/2019   Anxiety disorder 04/17/2019   Hardening of the aorta (main artery of the heart) ([place]) 04/17/2019   Vitamin B deficiency 04/17/2019   CAD (coronary artery disease) 03/17/2019   Carotid artery disease (HCC)    Postoperative atrial fibrillation ([place]) 12/08/2018   Peripheral vascular disease ([place]) 12/02/2018   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   Abnormal nuclear cardiac imaging test 11/04/2018   Long term  (current) use of insulin ([place]) 05/21/2018   AKI (acute kidney injury) ([place]) 05/02/2018   Sepsis ([place]) 05/01/2018   Cellulitis of right leg 05/01/2018   Abdominal pain 05/01/2018   Depression    DVT (deep venous thrombosis) (HCC)    Essential hypertension    Factor V Leiden mutation ([place])    HLD (hyperlipidemia)    Poorly controlled type 2 diabetes mellitus ([place])    Stroke (cerebrum) (HCC)    Age-related osteoporosis without current pathological fracture 03/28/2018   Gastro-esophageal reflux disease without esophagitis 03/28/2018   Long term (current) use of anticoagulants 03/28/2018   Occlusion and stenosis of bilateral carotid arteries 03/28/2018   Tobacco user 03/28/2018   Poorly controlled type 2 diabetes mellitus with circulatory disorder ([place]) 03/27/2018   Decreased urine volume 01/23/2016   Gout, unspecified 01/23/2016   Hypercalciuria 01/23/2016   Hyperuricosuria 01/23/2016   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[place]) 01/23/2016   Arthritis 06/22/2014   Recurrent nephrolithiasis 05/31/2012   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   Social History   Socioeconomic History   Marital status: Married    Spouse name: [name]   Number of children: Not on file   Years of education: 12   Highest education level: High school graduate  Occupational History   Occupation: Retired  Tobacco Use   Smoking status: Former    Packs/day: 1.00    Years: 38.00    Total pack years: 38.00    Types: Cigarettes    Quit date: 2004    Years since quitting: 20.2   Smokeless tobacco: Never  Vaping Use   Vaping Use: Never used  Substance and Sexual Activity   Alcohol use: Never   Drug use: Never   Sexual activity: Not Currently  Other Topics Concern   Not on file  Social History Narrative   Not on file   Social Determinants of Health   Financial Resource Strain: Low Risk  (02/05/2019)   Overall Financial Resource Strain (CARDIA)    Difficulty of Paying Living Expenses: Not hard at all  Food  Insecurity: No Food Insecurity (02/05/2019)   Hunger Vital Sign    Worried About Running Out of Food in the Last Year: Never true    Ran Out of Food in the Last Year: Never true  Transportation Needs: No Transportation Needs (02/05/2019)   PRAPARE - Hydrologist (Medical): No    Lack of Transportation (Non-Medical): No  Physical Activity: Inactive (02/05/2019)   Exercise Vital Sign  Days of Exercise per Week: 0 days    Minutes of Exercise per Session: 0 min  Stress: No Stress Concern Present (02/05/2019)   [place]    Feeling of Stress : Not at all  Social Connections: Not on file  Intimate Partner Violence: Not on file    Ms. Horsfall's family history includes COPD in her brother; Cataracts in her maternal grandmother; Diabetes in her brother and maternal grandmother; Lung cancer in her father; Stroke in her mother.      Objective:    Vitals:   02/25/23 1505  BP: 108/64  Pulse: 74  SpO2: 96%    Physical Exam Well-developed well-nourished eld WF  in no acute distress.  Height, [id number] BMI 23.18  HEENT; nontraumatic normocephalic, EOMI, PE R LA, sclera anicteric. Oropharynx;not examined Neck; supple, no JVD Cardiovascular; regular rate and rhythm with S1-S2, no murmur rub or gallop ,sternal incisional scar, Pulmonary; Clear bilaterally Abdomen; soft,  nondistended, there is some tenderness in the epigastrium and hypogastrium no guarding or rebound, no audible bruit no palpable mass or hepatosplenomegaly, bowel sounds are active Rectal;not done today  Skin; benign exam, no jaundice rash or appreciable lesions Extremities; no clubbing cyanosis or edema skin warm and dry Neuro/Psych; alert and oriented x4, grossly nonfocal mood and affect appropriate        Assessment & Plan:   #22 77 year old white female with chronic diarrhea present at least over the past year, usually 4-5  bowel movements per day, most bowel movements occur within an hour of eating, some urgency and cramping/pain associated. No response to a course of Xifaxan for SIBO, no response to over-the-counter antidiarrheals, no response to dicyclomine regular dosing 4 times daily. Patient had a very recent episode of significant worsening of diarrhea and had a couple of bouts of diarrhea with bright red blood noted in the commode about 2 weeks ago when she tried taking an antiviral for influenza prevention.  She stopped that medication and symptoms are back to baseline at present. CT of the abdomen pelvis without IV contrast on 02/15/2023 shows scattered diverticulosis and suggestion of significant stenosis along the distal aorta (and when compared with most recent CT with IV contrast felt to narrow down to about 4 mm and also with significant iliac stenosis. No lymphadenopathy but some scattered tiny nodules in the mesentery which are stable under 5 mm and felt to be most likely postinfectious.  CT scan raises question of potential mesenteric insufficiency chronic mesenteric ischemia due to account for patient's postprandial abdominal pain cramping and diarrhea.   #2 factor V Leyden mutation, on chronic Coumadin #3 coronary artery disease status post CABG x 5 #4.  Atrial fibrillation #5.  Chronic kidney disease stage III #6.  History of carotid stenosis and peripheral vascular disease #7.  Sleep apnea #8.  Adult onset diabetes mellitus #9.  Prior CVA #10.  Prior DVT  Plan; patient advised to stop dicyclomine Start trial of colestipol 2 g twice daily-patient advised to take this medication at least 2 hours away from any other medications and usually best dosed mid morning and mid afternoon. She has a prescription for Phenergan 12.5 to 1285 mg to use on a as needed basis. Will get patient scheduled for consultation with [facility], she has seen Dr. Caffie Pinto in the past.  She may need abdominal mesenteric   arteriogram If the above workup is negative and or not felt responsive for her symptoms and she does  not respond to trial of colestipol then most likely will need to get her off of metformin for a couple of months and see if that is contributing to her diarrhea and postprandial urgency. Patient has been established with Dr. Tarri Glenn, we has discussed transferring her care to Dr. Lyndel Safe in the future which patient is in agreement with.  Les Longmore Genia Harold PA-C 02/26/2023   Cc: [name], MD

## 2023-02-28 NOTE — Progress Notes (Signed)
Reviewed and agree with management plans. ? ?Tate Zagal L. Xoey Warmoth, MD, MPH  ?

## 2023-03-08 ENCOUNTER — Other Ambulatory Visit: Payer: Self-pay | Admitting: *Deleted

## 2023-03-08 ENCOUNTER — Encounter: Payer: Self-pay | Admitting: *Deleted

## 2023-03-08 DIAGNOSIS — R072 Precordial pain: Secondary | ICD-10-CM

## 2023-03-08 DIAGNOSIS — I2510 Atherosclerotic heart disease of native coronary artery without angina pectoris: Secondary | ICD-10-CM

## 2023-03-08 NOTE — Progress Notes (Signed)
Spoke with patient and discussed msg below with her. She agrees to proceed.

## 2023-03-11 ENCOUNTER — Encounter (HOSPITAL_COMMUNITY): Payer: Self-pay | Admitting: Physician Assistant

## 2023-03-13 ENCOUNTER — Ambulatory Visit: Payer: Federal, State, Local not specified - PPO | Admitting: Podiatry

## 2023-03-13 ENCOUNTER — Ambulatory Visit (HOSPITAL_COMMUNITY)
Admission: RE | Admit: 2023-03-13 | Discharge: 2023-03-13 | Disposition: A | Discharge status: Home / Self Care | Payer: Federal, State, Local not specified - PPO | Source: Ambulatory Visit | Attending: Cardiovascular Disease | Admitting: Cardiovascular Disease

## 2023-03-13 DIAGNOSIS — M79674 Pain in right toe(s): Secondary | ICD-10-CM

## 2023-03-13 DIAGNOSIS — B351 Tinea unguium: Secondary | ICD-10-CM

## 2023-03-13 DIAGNOSIS — I6529 Occlusion and stenosis of unspecified carotid artery: Secondary | ICD-10-CM | POA: Insufficient documentation

## 2023-03-13 DIAGNOSIS — M79675 Pain in left toe(s): Secondary | ICD-10-CM | POA: Diagnosis not present

## 2023-03-13 MED ORDER — CICLOPIROX 8 % EX SOLN: Freq: Every day | CUTANEOUS | 0 refills | Status: AC

## 2023-03-13 NOTE — Telephone Encounter (Signed)
Spoke with patient and she had already received her instructions through my chart.about her STRESS TEST on 03/15/23.  She was reminded to arrive 15 minutes early for registration.

## 2023-03-13 NOTE — Progress Notes (Signed)
  Subjective:  Patient ID: Adriana [name], female    DOB: 21-Oct-1946,  MRN: [id number]  Chief Complaint  Patient presents with   Nail Problem    Nail trim    77 y.o. female returns for the above complaint.  Patient presents with thickened and again dystrophic toenails x 10 mild pain on palpation hurts with ambulation worse with pressure would like to have them to be done she is not able to do it herself.  Denies any other acute complaints at this  Objective:  There were no vitals filed for this visit. Podiatric Exam: Vascular: dorsalis pedis and posterior tibial pulses are palpable bilateral. Capillary return is immediate. Temperature gradient is WNL. Skin turgor WNL  Sensorium: Normal Semmes Weinstein monofilament test. Normal tactile sensation bilaterally. Nail Exam: Pt has thick disfigured discolored nails with subungual debris noted bilateral entire nail hallux through fifth toenails.  Pain on palpation to the nails. Ulcer Exam: There is no evidence of ulcer or pre-ulcerative changes or infection. Orthopedic Exam: Muscle tone and strength are WNL. No limitations in general ROM. No crepitus or effusions noted.  Skin: No Porokeratosis. No infection or ulcers    Assessment & Plan:  No diagnosis found.  Patient was evaluated and treated and all questions answered.  Onychomycosis with pain  -Nails palliatively debrided as below. -Educated on self-care  Procedure: Nail Debridement Rationale: pain  Type of Debridement: manual, sharp debridement. Instrumentation: Nail nipper, rotary burr. Number of Nails: 10  Procedures and Treatment: Consent by patient was obtained for treatment procedures. The patient understood the discussion of treatment and procedures well. All questions were answered thoroughly reviewed. Debridement of mycotic and hypertrophic toenails, 1 through 5 bilateral and clearing of subungual debris. No ulceration, no infection noted.  Return Visit-Office  Procedure: Patient instructed to return to the office for a follow up visit 3 months for continued evaluation and treatment.  Boneta Lucks, DPM    No follow-ups on file.

## 2023-03-14 ENCOUNTER — Other Ambulatory Visit: Payer: Self-pay | Admitting: Physician Assistant

## 2023-03-14 ENCOUNTER — Ambulatory Visit: Payer: Federal, State, Local not specified - PPO | Admitting: Physician Assistant

## 2023-03-15 ENCOUNTER — Ambulatory Visit (HOSPITAL_COMMUNITY): Payer: Federal, State, Local not specified - PPO

## 2023-03-21 NOTE — Telephone Encounter (Signed)
UTR patient  by calling listed home number.

## 2023-03-22 ENCOUNTER — Ambulatory Visit (HOSPITAL_COMMUNITY): Payer: Federal, State, Local not specified - PPO | Attending: Physician Assistant

## 2023-03-22 DIAGNOSIS — R072 Precordial pain: Secondary | ICD-10-CM | POA: Insufficient documentation

## 2023-03-22 MED ORDER — AMINOPHYLLINE 25 MG/ML IV SOLN
75.0000 mg | Freq: Once | INTRAVENOUS | Status: AC
  Administered 2023-03-22: 75 mg via INTRAVENOUS

## 2023-03-22 MED ORDER — TECHNETIUM TC 99M TETROFOSMIN IV KIT
10.6000 | PACK | Freq: Once | INTRAVENOUS | Status: AC | PRN
  Administered 2023-03-22: 10.6 via INTRAVENOUS

## 2023-03-22 MED ORDER — TECHNETIUM TC 99M TETROFOSMIN IV KIT
30.7000 | PACK | Freq: Once | INTRAVENOUS | Status: AC | PRN
  Administered 2023-03-22: 30.7 via INTRAVENOUS

## 2023-03-22 MED ORDER — REGADENOSON 0.4 MG/5ML IV SOLN
0.4000 mg | Freq: Once | INTRAVENOUS | Status: AC
  Administered 2023-03-22: 0.4 mg via INTRAVENOUS

## 2023-03-25 LAB — MYOCARDIAL PERFUSION IMAGING
LV dias vol: 39 mL (ref 46–106)
LV sys vol: 17 mL
Nuc Stress EF: 58 %
Peak HR: 90 {beats}/min
Rest HR: 74 {beats}/min
Rest Nuclear Isotope Dose: 10.6 mCi
SDS: 0
SRS: 0
SSS: 0
ST Depression (mm): 0 mm
Stress Nuclear Isotope Dose: 30.7 mCi
TID: 1.06

## 2023-03-28 ENCOUNTER — Ambulatory Visit: Payer: Federal, State, Local not specified - PPO | Admitting: Neurology

## 2023-03-28 ENCOUNTER — Encounter: Payer: Self-pay | Admitting: Neurology

## 2023-03-28 VITALS — BP 153/77 | HR 88 | Ht 66.0 in | Wt 141.2 lb

## 2023-03-28 DIAGNOSIS — G4734 Idiopathic sleep related nonobstructive alveolar hypoventilation: Secondary | ICD-10-CM

## 2023-03-28 DIAGNOSIS — F40240 Claustrophobia: Secondary | ICD-10-CM

## 2023-03-28 DIAGNOSIS — G4733 Obstructive sleep apnea (adult) (pediatric): Secondary | ICD-10-CM

## 2023-03-28 DIAGNOSIS — Z789 Other specified health status: Secondary | ICD-10-CM | POA: Insufficient documentation

## 2023-03-28 NOTE — Patient Instructions (Signed)
Screening for Sleep Apnea  Sleep apnea is a condition in which breathing pauses or becomes shallow during sleep. Sleep apnea screening is a test to determine if you are at risk for sleep apnea. The test includes a series of questions. It will only takes a few minutes. Your health care provider may ask you to have this test in preparation for surgery or as part of a physical exam. What are the symptoms of sleep apnea? Common symptoms of sleep apnea include: Snoring. Waking up often at night. Daytime sleepiness. Pauses in breathing. Choking or gasping during sleep. Irritability. Forgetfulness. Trouble thinking clearly. Depression. Personality changes. Most people with sleep apnea do not know that they have it. What are the advantages of sleep apnea screening? Getting screened for sleep apnea can help: Ensure your safety. It is important for your health care providers to know whether or not you have sleep apnea, especially if you are having surgery or have other long-term (chronic) health conditions. Improve your health and allow you to get a better night's rest. Restful sleep can help you: Have more energy. Lose weight. Improve high blood pressure. Improve diabetes management. Prevent stroke. Prevent car accidents. What happens during the screening? Screening usually includes being asked a list of questions about your sleep quality. Some questions you may be asked include: Do you snore? Is your sleep restless? Do you have daytime sleepiness? Has a partner or spouse told you that you stop breathing during sleep? Have you had trouble concentrating or memory loss? What is your age? What is your neck circumference? To measure your neck, keep your back straight and gently wrap the tape measure around your neck. Put the tape measure at the middle of your neck, between your chin and collarbone. What is your sex assigned at birth? Do you have or are you being treated for high blood  pressure? If your screening test is positive, you are at risk for the condition. Further testing may be needed to confirm a diagnosis of sleep apnea. Where to find more information You can find screening tools online or at your health care clinic. For more information about sleep apnea screening and healthy sleep, visit these websites: Centers for Disease Control and Prevention: FootballExhibition.com.br American Sleep Apnea Association: www.sleepapnea.org Contact a health care provider if: You think that you may have sleep apnea. Summary Sleep apnea screening can help determine if you are at risk for sleep apnea. It is important for your health care providers to know whether or not you have sleep apnea, especially if you are having surgery or have other chronic health conditions. You may be asked to take a screening test for sleep apnea in preparation for surgery or as part of a physical exam. This information is not intended to replace advice given to you by your health care provider. Make sure you discuss any questions you have with your health care provider. Document Revised: 11/11/2020 Document Reviewed: 11/11/2020 Elsevier Patient Education  2023 Elsevier Inc. Healthy Living: Sleep In this video, you will learn why sleep is an important part of a healthy lifestyle. To view the content, go to this web address: https://pe.elsevier.com/s5aDUouV  This video will expire on: 11/28/2024. If you need access to this video following this date, please reach out to the healthcare provider who assigned it to you. This information is not intended to replace advice given to you by your health care provider. Make sure you discuss any questions you have with your health care provider. Elsevier Patient  Education  2023 Elsevier Inc.  

## 2023-03-28 NOTE — Progress Notes (Signed)
Provider:  Melvyn Novas, MD  Primary Care Physician:  [name], MD [address] [place] [address]     Referring Provider: [name], Md [address] [place],  [place] [address]          Chief Complaint according to patient   Patient presents with:     New Patient (Initial Visit)     Patient states she here today to discuss her sleep study results- from 2021. She never tolerated CPAP . Patient states she would like to discuss Inspire therapy.       HISTORY OF PRESENT ILLNESS:  Adriana [name] is a 76 y.o. female patient who is here for revisit 03/28/2023 for possible Inspire .  Chief concern according to patient :   I have the pleasure of seeing Adriana [name] here today meanwhile 77 year old Caucasian married mother for with a history of delayed sleep phase syndrome-circadian rhythm disorder, who had also a stroke in the remote past ( 2005) and hear disease with MI in 2020. She had already had a sleep test previously in [place] and was told that she had severe sleep apnea.  She was diagnosed by me with mild obstructive sleep apnea which was associated with significant sleep hypoxia.  The patient did not tolerate CPAP at home and has been without any treatment for the last 3 years.  She underwent a CPAP titration in lab which allowed [place] to fit her to a mask and she did not tolerate the mask in the lab rather well.  My technician stated that she struggled to breeze with a nasal mask but did not want to try a full facemask so he used a bilevel machine rather than CPAP and used a AirFit N30 I small mask this is 1 that sits under the nose and does not cover the mouth.  The patient did not have a lot of sleep time there was a.  Of over 1 hour of wakefulness in the middle of the night.  She also reports that she is forever a night owl and that she loves to be up at night when she is undisturbed and cannot concentrate on her interests while she  does not mind to sleep longer into the day.  It seems that sleep really started for her after midnight and at 5 AM the sleep study ended per protocol.   Allowing not a whole lot of sleep time to titrate her.  The technologist stated that at 7 cm water pressure there was no Apnea Hypopnea left.  AHI 0.0/h The patient also had no apneas and  hypopneas under 5 cm CPAP pressure which is rather surprising to me- but her oxygen levels were low with a nadir at 84%.        Last visit 01-25-2020.  She has used the prescribed auto CPAP for 3 nights, couldn't sleep and gave it to her husband.   See below - had in lab sleep study: IMPRESSION:   1.        Moderate degree of Obstructive Sleep Apnea (OSA) at AHI of 18.6/h with prolonged hypoxemia, and all non-supine sleep. remarkably, REM sleep apnea was not found, all events took place in NREM sleep, which is a hallmark of CSA.  2.        Insignificant degree of Periodic Limb Movement Disorder (PLMD) 3.        Primary Snoring   RECOMMENDATIONS:   1.  Advise full night, attended, CPAP titration study to optimize therapy for apnea and hypoxemia 2.        If insurance does not permit this, I will need to use auto CPAP at 5-15 cm water, 2 cm EPR and mask of choice , heated humidification.   11-14-2020:  . Obstructive Sleep Apnea with NREM dependence, responding well  to low CPAP pressures.  2. Some residual Sleep Related Hypoxemia was present, but the  time in hypoxia was cut by 50%, sleep improved.  3. A cluster of Periodic Limb Movements was noted between 3.20  and 4.20 AM, there were only few related arousals.      PLANS/RECOMMENDATIONS:  Auto CPAP device with a pressure setting between  4-10 cm water,  and with 1 cm EPR, heated humidification and a small ResMed  AirFit N30i nasal mask.  This worked well for her as she slept on her right side all  night. There was some oral air leak, but it did not cause  arousals.    Adriana [name] is a 77 year- old Caucasian female patient seen here as a referral on 07/18/2020 from Dr Evlyn Kanner.  Chief concern according to patient :  I will need a new sleep evaluation to get a CPAP and interface that works for me.    I have the pleasure of seeing Adriana [name] today, a right -handed Caucasian female with a possible sleep disorder.  She has a  has a past medical history of AKI (acute kidney injury) (HCC) (05/02/2018), Carotid artery disease (HCC), Cellulitis of right leg (05/01/2018), Coronary artery disease, Depression, DVT (deep venous thrombosis) (HCC), Essential hypertension, Factor V Leiden mutation (HCC), GERD (gastroesophageal reflux disease), History of kidney stones, History of sepsis (05/01/2018), History of stroke (2004), HLD (hyperlipidemia), Myocardial infarction (HCC), Pneumonia, PONV (postoperative nausea and vomiting), Poorly controlled type 2 diabetes mellitus (HCC), Seizures (HCC), and Sleep apnea. she suffers form kidney stones.    Her sleep study from 2011: The patient's sleep study analysis from [place] [facility] was reviewed here this was a study performed on 27 July 2010.  The patient has moderate obstructive sleep apnea with an AHI of 23 in all sleep positions in sleep stages and REM she reached severe apnea at 35/h there were a total of 111 events.  Oxygen nadir was 81%.  There was minimal tachycardia noted snoring was moderate sleep efficiency was rather poor at 64%.  This was signed by Dr. Tildon Husky, MD.  He recommended CPAP titration at the time for which I have no documentation.  Patient has a history of coronary artery disease a status post CABG, "carotid artery disease stenosis aortic atherosclerosis, diastolic dysfunction atrial fibrillation, paroxysmal, hypertension hyperlipidemia she has a history of stroke since 2004 and felt that this interfered the most with her sleep circadian rhythm.   I also reviewed her medication list which is very  long.      Sleep relevant medical history: " my sleep pattern is messed up since my stroke- I sleep 6-7 hours in daytime" 4-6 AM is my time to start sleeping.  CVA in 2004.   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    Social history: Patient is retired from [employer], Media planner.  she lives in a household with her husband . 4 adult children. 10 grandchildren, 4 great-grandchildren.  Pets are not present. Tobacco  use: 1.5 ppd- quit at the time of her stroke.   ETOH use never,  Caffeine intake in form of Coffee( 3 cups a week) Soda( caffeine free ) Tea (30 ounces 3 times a day) but no energy drinks.       Sleep habits are as follows: The patient's dinner time is between 6-7 PM. The patient goes to bed at 4-6 AM and continues to sleep for 7 hours, wakes for some bathroom breaks.   The preferred sleep position is right laterally, with the support of 3 pillows.  Dreams are reportedly vivid.  11-15, afternoon s the usual rise time.  The patient wakes up spontaneously.  She reports not feeling refreshed or restored in AM, with symptoms such as dry mouth,  morning headaches, and residual fatigue.  Naps are taken infrequently.     Review of Systems: Out of a complete 14 system review, the patient complains of only the following symptoms, and all other reviewed systems are negative.:  Fatigue, sleepiness ,snoring -loud , fragmented sleep,delayed sleep phase    How likely are you to doze in the following situations: 0 = not likely, 1 = slight chance, 2 = moderate chance, 3 = high chance   Sitting and Reading? Watching Television? Sitting inactive in a public place (theater or meeting)? As a passenger in a car for an hour without a break? Lying down in the afternoon when circumstances permit? Sitting and talking to someone? Sitting quietly after lunch without alcohol? In a car, while stopped  for a few minutes in traffic?   Total = 2/ 24 points   FSS endorsed at 31/ 63 points.   Social History   Socioeconomic History   Marital status: Married    Spouse name: [name]   Number of children: Not on file   Years of education: 12   Highest education level: High school graduate  Occupational History   Occupation: Retired  Tobacco Use   Smoking status: Former    Packs/day: 1.00    Years: 38.00    Additional pack years: 0.00    Total pack years: 38.00    Types: Cigarettes    Quit date: 2004    Years since quitting: 20.2   Smokeless tobacco: Never  Vaping Use   Vaping Use: Never used  Substance and Sexual Activity   Alcohol use: Never   Drug use: Never   Sexual activity: Not Currently  Other Topics Concern   Not on file  Social History Narrative   Not on file   Social Determinants of Health   Financial Resource Strain: Low Risk  (02/05/2019)   Overall Financial Resource Strain (CARDIA)    Difficulty of Paying Living Expenses: Not hard at all  Food Insecurity: No Food Insecurity (02/05/2019)   Hunger Vital Sign    Worried About Running Out of Food in the Last Year: Never true    Ran Out of Food in the Last Year: Never true  Transportation Needs: No Transportation Needs (02/05/2019)   PRAPARE - Administrator, Civil Service (Medical): No    Lack of Transportation (Non-Medical): No  Physical Activity: Inactive (02/05/2019)   Exercise Vital Sign    Days of Exercise per Week: 0 days    Minutes of Exercise per Session: 0 min  Stress: No Stress Concern Present (02/05/2019)   [employer] of Occupational Health - Occupational Stress Questionnaire    Feeling of Stress : Not at all  Social Connections: Not on file  Family History  Problem Relation Age of Onset   Stroke Mother    Lung cancer Father    COPD Brother    Diabetes Brother    Cataracts Maternal Grandmother    Diabetes Maternal Grandmother    Amblyopia Neg Hx    Blindness Neg Hx     Glaucoma Neg Hx    Hypertension Neg Hx    Macular degeneration Neg Hx    Retinal detachment Neg Hx    Strabismus Neg Hx    Retinitis pigmentosa Neg Hx    Colon cancer Neg Hx    Esophageal cancer Neg Hx    Pancreatic cancer Neg Hx    Stomach cancer Neg Hx     Past Medical History:  Diagnosis Date   AKI (acute kidney injury) 05/02/2018   Carotid artery disease    hx of bilat CEA // Carotid US 12/19: R 40-59; L 1-39   Cellulitis of right leg 05/01/2018   Coronary artery disease    Myoview 10/19 High Risk // LHC 11/19 w/ 3 v CAD // Echo 11/19: EF 50-55, gr 1 DD, ant-sept, inf-sept, apical HK // s/p CABG in 11/2018   Depression    denies   DVT (deep venous thrombosis)    in setting of Factor V Leiden mutation   Essential hypertension    Factor V Leiden mutation    Chronic Warfarin managed by PCP // hx of recurrent DVT; prior CVA   GERD (gastroesophageal reflux disease)    History of kidney stones    History of sepsis 05/01/2018   History of stroke 2004   right side weakness-resolved   HLD (hyperlipidemia)    Myocardial infarction    Pneumonia    x2   PONV (postoperative nausea and vomiting)    blood pressure up/down, confusion   Poorly controlled type 2 diabetes mellitus    Seizures    Sleep apnea     Past Surgical History:  Procedure Laterality Date   ABDOMINAL HYSTERECTOMY  1989   BACK SURGERY     CARDIAC CATHETERIZATION     Carotid artery endarterectomy     right and left side with stents   CATARACT EXTRACTION Bilateral 2017   CHOLECYSTECTOMY     CORONARY ARTERY BYPASS GRAFT N/A 11/17/2018   Procedure: CORONARY ARTERY BYPASS GRAFTING (CABG), ON PUMP, TIMES FIVE, USING LEFT INTERNAL MAMMARY ARTERY AND ENDOSCOPICALLY HARVESTED LEFT GREATER SAPHENOUS VEIN;  Surgeon: Alleen BorneBartle, Bryan K, MD;  Location: [facility] OR;  Service: Open Heart Surgery;  Laterality: N/A;   LEFT HEART CATH AND CORONARY ANGIOGRAPHY N/A 11/04/2018   Procedure: LEFT HEART CATH AND CORONARY ANGIOGRAPHY;   Surgeon: Tonny Bollmanooper, Michael, MD;  [facility];  Service: Cardiovascular;  Laterality: N/A;   LITHOTRIPSY     x3   TEE WITHOUT CARDIOVERSION N/A 11/17/2018   Procedure: TRANSESOPHAGEAL ECHOCARDIOGRAM (TEE);  Surgeon: Alleen BorneBartle, Bryan K, MD;  [facility];  Service: Open Heart Surgery;  Laterality: N/A;   TONSILLECTOMY     trigger fingers left and right Bilateral    TUBAL LIGATION       Current Outpatient Medications on File Prior to Visit  Medication Sig Dispense Refill   ACCU-CHEK GUIDE test strip CHECK BLOOD SUGAR THREE TIMES DAILY     acetaminophen (TYLENOL) 500 MG tablet Take 500 mg by mouth every 6 (six) hours as needed.     buPROPion (WELLBUTRIN XL) 300 MG 24 hr tablet Take 300 mg by mouth daily.   2  chlorthalidone (HYGROTON) 25 MG tablet Take 25 mg by mouth daily.     Cholecalciferol (VITAMIN D) 2000 units CAPS Take 2,000 Units by mouth daily.      ciclopirox (PENLAC) 8 % solution Apply topically at bedtime. Apply over nail and surrounding skin. Apply daily over previous coat. After seven (7) days, may remove with alcohol and continue cycle. 6.6 mL 0   clobetasol cream (TEMOVATE) 0.05 % Apply 1 application topically daily as needed (for psoriasis).      colestipol (COLESTID) 1 g tablet Take 2 tablets (2 g total) by mouth 2 (two) times daily. 60 tablet 0   Continuous Blood Gluc Sensor (FREESTYLE LIBRE 14 DAY SENSOR) MISC 1 each by Does not apply route every 14 (fourteen) days. Change every 2 weeks 2 each 11   cyanocobalamin 2000 MCG tablet Take 2,000 mcg by mouth daily.     Insulin Glargine (BASAGLAR KWIKPEN) 100 UNIT/ML Inject 24 Units into the skin at bedtime.     insulin lispro (HUMALOG) 100 UNIT/ML injection Inject 0.06 mLs (6 Units total) into the skin 3 (three) times daily before meals. (Patient taking differently: Inject 12 Units into the skin 3 (three) times daily before meals.) 10 mL 11   lisinopril (PRINIVIL,ZESTRIL) 5 MG tablet Take 5 mg by mouth daily.      metFORMIN (GLUCOPHAGE-XR) 750 MG 24 hr tablet Take 750 mg by mouth 2 (two) times daily.     metoprolol tartrate (LOPRESSOR) 25 MG tablet Take 25 mg by mouth 2 (two) times daily.     nitroGLYCERIN (NITROSTAT) 0.4 MG SL tablet Place 1 tablet (0.4 mg total) under the tongue every 5 (five) minutes as needed for chest pain. 25 tablet 3   potassium citrate (UROCIT-K) 10 MEQ (1080 MG) SR tablet Take 10 mEq by mouth 2 (two) times daily.     PROLIA 60 MG/ML SOSY injection Inject into the skin.     promethazine (PHENERGAN) 25 MG tablet Take 12.5 mg by mouth every evening.   1   rifaximin (XIFAXAN) 550 MG TABS tablet Take 1 tablet (550 mg total) by mouth 3 (three) times daily. 42 tablet 0   rosuvastatin (CRESTOR) 40 MG tablet TAKE 1 TABLET(40 MG) BY MOUTH DAILY 90 tablet 3   warfarin (COUMADIN) 5 MG tablet Take by mouth See admin instructions. Take 5 mg every Monday and Friday; 2.5 mg all other days of the week     white petrolatum (VASELINE) GEL Apply 1 application topically daily as needed (for psoriasis).     No current facility-administered medications on file prior to visit.    Allergies  Allergen Reactions   Aspirin Nausea And Vomiting   Propofol Nausea And Vomiting    Confused BP goes up and down.   Propofol Nausea And Vomiting    Confused BP goes up and down.   Dapagliflozin Other (See Comments)    "causes UTIs"     Ibandronic Acid    Sulfa Antibiotics     Other reaction(s): nausea   Trulicity [Dulaglutide]      DIAGNOSTIC DATA (LABS, IMAGING, TESTING) - I reviewed patient records, labs, notes, testing and imaging myself where available.  Lab Results  Component Value Date   WBC 8.6 01/26/2022   HGB 13.5 01/26/2022   HCT 40.8 01/26/2022   MCV 89.1 01/26/2022   PLT 273.0 01/26/2022      Component Value Date/Time   NA 142 01/26/2022 1633   NA 136 11/03/2018 1046   K 4.7 01/26/2022  1633   CL 101 01/26/2022 1633   CO2 36 (H) 01/26/2022 1633   GLUCOSE 120 (H) 01/26/2022 1633    BUN 27 (H) 01/26/2022 1633   BUN 22 11/03/2018 1046   CREATININE 1.06 01/26/2022 1633   CALCIUM 10.2 01/26/2022 1633   PROT 7.1 01/26/2022 1633   PROT 7.3 11/14/2020 1614   ALBUMIN 4.2 01/26/2022 1633   ALBUMIN 4.2 11/14/2020 1614   AST 16 01/26/2022 1633   ALT 21 01/26/2022 1633   ALKPHOS 67 01/26/2022 1633   BILITOT 0.3 01/26/2022 1633   BILITOT 0.3 11/14/2020 1614   GFRNONAA >60 11/23/2018 0402   GFRAA >60 11/23/2018 0402   Lab Results  Component Value Date   CHOL 123 11/14/2020   HDL 47 11/14/2020   LDLCALC 50 11/14/2020   TRIG 154 (H) 11/14/2020   CHOLHDL 2.6 11/14/2020   Lab Results  Component Value Date   HGBA1C 8.5 (H) 11/12/2018   No results found for: "VITAMINB12" No results found for: "TSH"  PHYSICAL EXAM:  Today's Vitals   03/28/23 1325  BP: (!) 153/77  Pulse: 88  Weight: 141 lb 3.2 oz (64 kg)  Height: 5\' 6"  (1.676 m)   Body mass index is 22.79 kg/m.   Wt Readings from Last 3 Encounters:  03/28/23 141 lb 3.2 oz (64 kg)  03/22/23 143 lb (64.9 kg)  02/26/23 143 lb 10.1 oz (65.2 kg)     Ht Readings from Last 3 Encounters:  03/28/23 5\' 6"  (1.676 m)  03/22/23 5\' 6"  (1.676 m)  02/26/23 5\' 6"  (1.676 m)      General: The patient is awake, alert and appears not in acute distress. The patient is well groomed. Head: Normocephalic, atraumatic. Neck is supple. Mallampati 2,  neck circumference: 17 inches . Nasal airflow   patent.  Retrognathia is not seen.  Dental status: intact  Cardiovascular:  Regular rate and cardiac rhythm by pulse,  without distended neck veins. Respiratory: Lungs are clear to auscultation.  Skin:  Without evidence of ankle edema, or rash. Trunk: The patient's posture is erect.   Neurologic exam : The patient is awake and alert, oriented to place and time.   Memory subjective described as intact.  Attention span & concentration ability appears normal.  Speech is fluent,  without  dysarthria, dysphonia or aphasia.  Mood and  affect are appropriate.   Cranial nerves: no loss of smell or taste reported  Pupils are equal and briskly reactive to light. She had bilateral cataract surgery- Funduscopic exam deferred. .  Extraocular movements in vertical and horizontal planes were intact and without nystagmus. No Diplopia. Visual fields by finger perimetry are intact. Hearing was intact to soft voice and finger rubbing.    Facial sensation intact to fine touch.  Facial motor strength is symmetric and tongue and uvula move midline.  Neck ROM : rotation, tilt and flexion extension were normal for age and shoulder shrug was symmetrical.    Motor exam:  Symmetric bulk, tone and ROM. Her right arm was weakened by stroke-   Normal tone without cog- wheeling, symmetric grip strength .   Sensory:  Fine touch  and vibration were  normal.  Proprioception tested in the upper extremities was normal.   Coordination: Rapid alternating movements in the fingers/hands were of normal speed.  The Finger-to-nose maneuver was intact without evidence of ataxia, dysmetria or tremor.   Gait and station: Patient could rise unassisted from a seated position, walked without assistive device.  Stance  is of normal width/ base and the patient turned with 3 steps.  Toe and heel walk were deferred.  Deep tendon reflexes: in the  upper and lower extremities are symmetric and intact.  Babinski response was deferred .  ASSESSMENT AND PLAN 77 y.o. year old female  here with: Hypoxic sleep apnea, factor V Leiden, former smoker, CVA, MI and CAD, circadian delayed phase sleep syndrome.  Here to discuss alternative therapies to CPAP/ BiPAP . I can offer a repeat sleep study but she is not interested. She was surprised to learn that the device is implanted and requires anesthesia times 2, surgery once and 3 sleep studies afterwards to titrate the voltage .     1) History of mild sleep apnea ( AHI was 18.6/h) , , associated with hypoxia ( nadir at 83%- 41   minutes) - she is not a candidate for inspire. She reports being CPAP intolerant, tried Bipap and CPAP and nasal mask in our lab.  She had no new attempt to try PAP therapy after the supply chain issues culminated- she would be willing to repeat a HST and if there is similar finding would want to go for oxygen supplementation to pulmonology.    Neither a dental device nor inspire can address sleep hypoxia. The only alternative would be oxygen without CPAP which I cannot prescribe. I would then recommend a pulmonology referral to check this option.    HST and follow up with me or NP in 3-4 months.   I would like to thank [name], MD and [name], Md [address] [place],  [place] [address] for allowing me to meet with his patient.   CC: I will share my notes with Dr Excell Seltzer, MD .  After spending a total time of  30  minutes face to face and additional time for physical and neurologic examination, review of laboratory studies,  personal review of imaging studies, reports and results of other testing and review of referral information / records as far as provided in visit,   Electronically signed by: Melvyn Novas, MD 03/28/2023 1:48 PM  [facility] Board certified by [employer] of Sleep Medicine and Diplomate of the [employer] of Sleep Medicine. Board certified In Neurology through the ABPN, Fellow of the [employer] of Neu[employer]. Medical Director of [facility].

## 2023-04-04 ENCOUNTER — Telehealth: Payer: Self-pay | Admitting: Neurology

## 2023-04-04 ENCOUNTER — Other Ambulatory Visit (HOSPITAL_COMMUNITY): Payer: Self-pay | Admitting: Cardiovascular Disease

## 2023-04-04 DIAGNOSIS — I6523 Occlusion and stenosis of bilateral carotid arteries: Secondary | ICD-10-CM

## 2023-04-04 NOTE — Telephone Encounter (Signed)
HST- BCBS fed no auth req   Patient is scheduled at [facility] for 04/23/23 at 3 pm  Mailed packet to the patient.

## 2023-04-09 ENCOUNTER — Ambulatory Visit: Payer: Federal, State, Local not specified - PPO | Admitting: Gastroenterology

## 2023-04-17 ENCOUNTER — Ambulatory Visit: Payer: Federal, State, Local not specified - PPO | Attending: Cardiovascular Disease | Admitting: Cardiovascular Disease

## 2023-04-17 ENCOUNTER — Encounter: Payer: Self-pay | Admitting: Cardiovascular Disease

## 2023-04-17 VITALS — BP 116/66 | HR 77 | Ht 66.0 in | Wt 139.8 lb

## 2023-04-17 DIAGNOSIS — E782 Mixed hyperlipidemia: Secondary | ICD-10-CM | POA: Diagnosis not present

## 2023-04-17 DIAGNOSIS — I209 Angina pectoris, unspecified: Secondary | ICD-10-CM | POA: Diagnosis not present

## 2023-04-17 DIAGNOSIS — Z01810 Encounter for preprocedural cardiovascular examination: Secondary | ICD-10-CM

## 2023-04-17 DIAGNOSIS — I779 Disorder of arteries and arterioles, unspecified: Secondary | ICD-10-CM

## 2023-04-17 DIAGNOSIS — I10 Essential (primary) hypertension: Secondary | ICD-10-CM

## 2023-04-17 DIAGNOSIS — I2510 Atherosclerotic heart disease of native coronary artery without angina pectoris: Secondary | ICD-10-CM

## 2023-04-17 MED ORDER — ISOSORBIDE MONONITRATE ER 30 MG PO TB24: 15.0000 mg | ORAL_TABLET | Freq: Every day | ORAL | 3 refills | Status: DC

## 2023-04-17 NOTE — Patient Instructions (Addendum)
Medication Instructions:  START Isosorbide Mononitrate (Imdur) 15mg  daily *If you need a refill on your cardiac medications before your next appointment, please call your pharmacy*   Lab Work: CBC, BMET, PT-INR (within 3 daysn of procedure) If you have labs (blood work) drawn today and your tests are completely normal, you will receive your results only by: MyChart Message (if you have MyChart) OR A paper copy in the mail If you have any lab test that is abnormal or we need to change your treatment, we will call you to review the results.  Testing/Procedures: L heart catherization Your physician has requested that you have a cardiac catheterization. Cardiac catheterization is used to diagnose and/or treat various heart conditions. Doctors may recommend this procedure for a number of different reasons. The most common reason is to evaluate chest pain. Chest pain can be a symptom of coronary artery disease (CAD), and cardiac catheterization can show whether plaque is narrowing or blocking your heart's arteries. This procedure is also used to evaluate the valves, as well as measure the blood flow and oxygen levels in different parts of your heart. For further information please visit [email or web address] Please follow instruction sheet, as given.  Follow-Up: At [facility], you and your health needs are our priority.  As part of our continuing mission to provide you with exceptional heart care, we have created designated Provider Care Teams.  These Care Teams include your primary Cardiologist (physician) and Advanced Practice Providers (APPs -  Physician Assistants and Nurse Practitioners) who all work together to provide you with the care you need, when you need it.  Your next appointment:   6 month(s)  Provider:   Suzzanne Cloud, Baxter Kail or Dick     Other Instructions       Cardiac/Peripheral Catheterization   You are scheduled for a Cardiac Catheterization on Tuesday,  May 14 with Dr. [name].  1. Please arrive at the [facility] (Main Entrance A) at [facility]: [address] [place], [place] [address] at 7:00 AM (This time is two hour(s) before your procedure to ensure your preparation). Free valet parking service is available. You will check in at ADMITTING. The support person will be asked to wait in the waiting room.  It is OK to have someone drop you off and come back when you are ready to be discharged.        Special note: Every effort is made to have your procedure done on time. Please understand that emergencies sometimes delay scheduled procedures.  2. Diet: Do not eat solid foods after midnight.  You may have clear liquids until 5 AM the day of the procedure.  3. Labs: 04/26/23. You do not need to be fasting.  4. Medication instructions in preparation for your procedure:   Contrast Allergy: No  HOLD Warfarin for 4 days prior to procedure (last dose 04/25/23, resume 05/01/23) HOLD Lisinopril the day before and day of procedure TAKE ONLY 1/2 dose of Basaglar insulin the night prior to procedure (12 units) DO NOT TAKE Potassium or Chlorthalidone the day of procedure DO NOT TAKE Metformin the day of procedure and hold for 2 days afterwards (last dose 04/29/23, resume 05/03/23)  On the morning of your procedure, take Aspirin 81 mg and any morning medicines NOT listed above.  You may use sips of water.  5. Plan to go home the same day, you will only stay overnight if medically necessary. 6. You MUST have a responsible adult to  drive you home. 7. An adult MUST be with you the first 24 hours after you arrive home. 8. Bring a current list of your medications, and the last time and date medication taken. 9. Bring ID and current insurance cards. 10.Please wear clothes that are easy to get on and off and wear slip-on shoes.  Thank you for allowing [place] to care for you!   -- [facility]

## 2023-04-17 NOTE — H&P (View-Only) (Signed)
Cardiology Office Note:    Date:  04/19/2023   ID:  Adriana [name], DOB 05/28/1946, MRN [id number]  PCP:  South, Stephen, MD   [facility] Providers Cardiologist:  Connee Ikner, MD     Referring MD: South, Stephen, MD   Chief Complaint  Patient presents with   Coronary Artery Disease    History of Present Illness:    Adriana [name] is a 77 y.o. female presenting for follow-up of coronary artery disease.  The patient was diagnosed with multivessel CAD in 2019 and underwent CABG at that time.  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  The patient and has vascular disease with history of bilateral carotid endarterectomy.  She is followed for diabetes, obstructive sleep apnea, and mixed hyperlipidemia.  The patient is here alone today.  She has been having a lot of trouble with "heartburn" over the last 6 months.  She states this feels very similar to her symptoms that predated CABG.  She never really had classic symptoms of chest pain or pressure, but complained of burning in her chest.  This is now recurred, but has been really difficult to determine whether it is related to true reflux or to cardiac pain.  She did have a stress test earlier this month demonstrating a gated LVEF of 58%, anteroapical scar with what appears to be mild peri-infarct ischemia.  Overall findings were improved from those prior to her bypass surgery.  The patient complains of generalized fatigue but no shortness of breath, orthopnea, PND, or heart palpitations.  States that eating worsens her symptoms and that she is not able to lie down after eating.  Much of her heartburn is at night, but also exacerbated by physical activity such as walking.  Past Medical History:  Diagnosis Date   AKI (acute kidney injury) (HCC) 05/02/2018   Carotid artery disease (HCC)    hx of bilat CEA // Carotid US 12/19: R 40-59; L  1-39   Cellulitis of right leg 05/01/2018   Coronary artery disease    Myoview 10/19 High Risk // LHC 11/19 w/ 3 v CAD // Echo 11/19: EF 50-55, gr 1 DD, ant-sept, inf-sept, apical HK // s/p CABG in 11/2018   Depression    denies   DVT (deep venous thrombosis) (HCC)    in setting of Factor V Leiden mutation   Essential hypertension    Factor V Leiden mutation (HCC)    Chronic Warfarin managed by PCP // hx of recurrent DVT; prior CVA   GERD (gastroesophageal reflux disease)    History of kidney stones    History of sepsis 05/01/2018   History of stroke 2004   right side weakness-resolved   HLD (hyperlipidemia)    Myocardial infarction (HCC)    Pneumonia    x2   PONV (postoperative nausea and vomiting)    blood pressure up/down, confusion   Poorly controlled type 2 diabetes mellitus (HCC)    Seizures (HCC)    Sleep apnea     Past Surgical History:  Procedure Laterality Date   ABDOMINAL HYSTERECTOMY  1989   BACK SURGERY     CARDIAC CATHETERIZATION     Carotid artery endarterectomy     right and left side with stents   CATARACT EXTRACTION Bilateral 2017   CHOLECYSTECTOMY     CORONARY ARTERY BYPASS GRAFT N/A 11/17/2018   Procedure: CORONARY ARTERY BYPASS GRAFTING (CABG), ON PUMP, TIMES   FIVE, USING LEFT INTERNAL MAMMARY ARTERY AND ENDOSCOPICALLY HARVESTED LEFT GREATER SAPHENOUS VEIN;  Surgeon: Bartle, Bryan K, MD;  Location: [facility] OR;  Service: Open Heart Surgery;  Laterality: N/A;   LEFT HEART CATH AND CORONARY ANGIOGRAPHY N/A 11/04/2018   Procedure: LEFT HEART CATH AND CORONARY ANGIOGRAPHY;  Surgeon: Latiana Tomei, MD;  Location: [facility] INVASIVE CV LAB;  Service: Cardiovascular;  Laterality: N/A;   LITHOTRIPSY     x3   TEE WITHOUT CARDIOVERSION N/A 11/17/2018   Procedure: TRANSESOPHAGEAL ECHOCARDIOGRAM (TEE);  Surgeon: Bartle, Bryan K, MD;  Location: [facility] OR;  Service: Open Heart Surgery;  Laterality: N/A;   TONSILLECTOMY     trigger fingers left and right Bilateral    TUBAL LIGATION       Current Medications: Current Meds  Medication Sig   ACCU-CHEK GUIDE test strip CHECK BLOOD SUGAR THREE TIMES DAILY   acetaminophen (TYLENOL) 500 MG tablet Take 500 mg by mouth every 6 (six) hours as needed.   buPROPion (WELLBUTRIN XL) 300 MG 24 hr tablet Take 300 mg by mouth daily.    chlorthalidone (HYGROTON) 25 MG tablet Take 25 mg by mouth daily.   Cholecalciferol (VITAMIN D) 2000 units CAPS Take 2,000 Units by mouth daily.    ciclopirox (PENLAC) 8 % solution Apply topically at bedtime. Apply over nail and surrounding skin. Apply daily over previous coat. After seven (7) days, may remove with alcohol and continue cycle.   Continuous Blood Gluc Sensor (FREESTYLE LIBRE 14 DAY SENSOR) MISC 1 each by Does not apply route every 14 (fourteen) days. Change every 2 weeks   cyanocobalamin 2000 MCG tablet Take 2,000 mcg by mouth daily.   Insulin Glargine (BASAGLAR KWIKPEN) 100 UNIT/ML Inject 24 Units into the skin at bedtime.   insulin lispro (HUMALOG) 100 UNIT/ML injection Inject 0.06 mLs (6 Units total) into the skin 3 (three) times daily before meals. (Patient taking differently: Inject 5 Units into the skin 3 (three) times daily before meals.)   isosorbide mononitrate (IMDUR) 30 MG 24 hr tablet Take 0.5 tablets (15 mg total) by mouth daily.   lisinopril (PRINIVIL,ZESTRIL) 5 MG tablet Take 5 mg by mouth daily.   metFORMIN (GLUCOPHAGE-XR) 750 MG 24 hr tablet Take 1,000 mg by mouth 2 (two) times daily.   metoprolol tartrate (LOPRESSOR) 25 MG tablet Take 25 mg by mouth 2 (two) times daily.   nitroGLYCERIN (NITROSTAT) 0.4 MG SL tablet Place 1 tablet (0.4 mg total) under the tongue every 5 (five) minutes as needed for chest pain.   potassium citrate (UROCIT-K) 10 MEQ (1080 MG) SR tablet Take 10 mEq by mouth 2 (two) times daily.   promethazine (PHENERGAN) 25 MG tablet Take 12.5 mg by mouth every evening.    rosuvastatin (CRESTOR) 40 MG tablet TAKE 1 TABLET(40 MG) BY MOUTH DAILY   warfarin (COUMADIN)  5 MG tablet Take by mouth See admin instructions. Take 5 mg every Monday and Friday; 2.5 mg all other days of the week   white petrolatum (VASELINE) GEL Apply 1 application topically daily as needed (for psoriasis).     Allergies:   Aspirin, Propofol, Propofol, Dapagliflozin, Ibandronic acid, Sulfa antibiotics, and Trulicity [dulaglutide]   Social History   Socioeconomic History   Marital status: Married    Spouse name: [name]   Number of children: Not on file   Years of education: 12   Highest education level: High school graduate  Occupational History   Occupation: Retired  Tobacco Use   Smoking status: Former      Packs/day: 1.00    Years: 38.00    Additional pack years: 0.00    Total pack years: 38.00    Types: Cigarettes    Quit date: 2004    Years since quitting: 20.3   Smokeless tobacco: Never  Vaping Use   Vaping Use: Never used  Substance and Sexual Activity   Alcohol use: Never   Drug use: Never   Sexual activity: Not Currently  Other Topics Concern   Not on file  Social History Narrative   Not on file   Social Determinants of Health   Financial Resource Strain: Low Risk  (02/05/2019)   Overall Financial Resource Strain (CARDIA)    Difficulty of Paying Living Expenses: Not hard at all  Food Insecurity: No Food Insecurity (02/05/2019)   Hunger Vital Sign    Worried About Running Out of Food in the Last Year: Never true    Ran Out of Food in the Last Year: Never true  Transportation Needs: No Transportation Needs (02/05/2019)   PRAPARE - Transportation    Lack of Transportation (Medical): No    Lack of Transportation (Non-Medical): No  Physical Activity: Inactive (02/05/2019)   Exercise Vital Sign    Days of Exercise per Week: 0 days    Minutes of Exercise per Session: 0 min  Stress: No Stress Concern Present (02/05/2019)   [employer] - Occupational Stress Questionnaire    Feeling of Stress : Not at all  Social Connections: Not on  file     Family History: The patient's family history includes COPD in her brother; Cataracts in her maternal grandmother; Diabetes in her brother and maternal grandmother; Lung cancer in her father; Stroke in her mother. There is no history of Amblyopia, Blindness, Glaucoma, Hypertension, Macular degeneration, Retinal detachment, Strabismus, Retinitis pigmentosa, Colon cancer, Esophageal cancer, Pancreatic cancer, or Stomach cancer.  ROS:   Please see the history of present illness.    All other systems reviewed and are negative.  EKGs/Labs/Other Studies Reviewed:    The following studies were reviewed today: Cardiac Studies & Procedures   CARDIAC CATHETERIZATION  CARDIAC CATHETERIZATION 11/04/2018  Narrative  Prox RCA lesion is 100% stenosed.  Mid LM lesion is 30% stenosed.  There is mild left ventricular systolic dysfunction.  LV end diastolic pressure is mildly elevated.  The left ventricular ejection fraction is 50-55% by visual estimate.  Ost 1st Mrg to 1st Mrg lesion is 75% stenosed.  Ost LAD lesion is 90% stenosed.  Ost 1st Diag to 1st Diag lesion is 80% stenosed.  Prox LAD-1 lesion is 80% stenosed.  Prox LAD-2 lesion is 80% stenosed.  1. 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 Chronic subtotal occlusion of the LAD, filling antegrade but also supplied by right-left collaterals 3. Severe diffuse proximal and mid-LAD and first diagonal stenoses 4. Severe diffuse OM stenosis 5. Mild segmental LV systolic dysfunction  Recommend outpatient TCTS consultation for consideration of CABG in this patient with diabetes, LV dysfunction, and multivessel CAD  Findings Coronary Findings Diagnostic  Dominance: Right  Left Main Mid LM lesion is 30% stenosed. The lesion is moderately calcified.  Left Anterior Descending Collaterals Prox LAD filled by collaterals from Ost RCA.  Ost LAD lesion is 90% stenosed.  The lesion is eccentric. The lesion is calcified. Severe ostial LAD stenosis with heavy calcification Prox LAD-1 lesion is 80% stenosed. Prox LAD-2 lesion is 80% stenosed.  First Diagonal Branch Ost 1st Diag to   1st Diag lesion is 80% stenosed.  Left Circumflex  First Obtuse Marginal Branch Ost 1st Mrg to 1st Mrg lesion is 75% stenosed. the first OM divides into twin branches, with the superior branch showing 50% stenosis and the larger inferior branch showing diffuse 75% stenosis.  Right Coronary Artery Prox RCA lesion is 100% stenosed. The lesion is moderately calcified. collateralized from the LCA, chronic occlusion  Acute Marginal Branch Collaterals Acute Mrg filled by collaterals from Dist Cx.  Right Posterior Descending Artery Collaterals RPDA filled by collaterals from Dist Cx.  Intervention  No interventions have been documented.   STRESS TESTS  MYOCARDIAL PERFUSION IMAGING 03/25/2023  Narrative   Findings are consistent with small area of anteroapical infarction with mild peri-infarct ischemia. The study is overall low risk.   No ST deviation was noted.   LV perfusion is abnormal. There is a small segment of infarction. Possible mild periinfarct ischemia in the anterior apex extending toward the mid ventricle. Defect 1: There is a small defect with mild reduction in uptake present in the apical anterior location(s) that is partially reversible. There is normal wall motion in the defect area. Consistent with infarction.   Left ventricular function is normal. Nuclear stress EF: 58 %. The left ventricular ejection fraction is normal (55-65%). End diastolic cavity size is normal. End systolic cavity size is normal.   Prior study available for comparison from 10/29/2018. There are changes compared to prior study which appear to be improved.   ECHOCARDIOGRAM  ECHOCARDIOGRAM COMPLETE 10/29/2018  Narrative *[facility]* [address] [place], [place]  [address] [phone]  ------------------------------------------------------------------- Transthoracic Echocardiography  Patient:    Clausing, Sulma Leigh MR #:       [id number] Study Date: 10/29/2018 Gender:     F Age:        72 Height:     167.6 cm Weight:     68.9 kg BSA:        1.8 m^2 Pt. Status: Room:  ORDERING     Kendrix Orman, MD REFERRING    Sayre Witherington, MD SONOGRAPHER  NaTashia Rodgers, RDCS PERFORMING   [facility], Outpatient ATTENDING    Christopher, Bridgette  cc:  ------------------------------------------------------------------- LV EF: 50% -   55%  ------------------------------------------------------------------- Indications:      R06.02 Shortness of breath.  ECHO WITH DEFINITY.  ------------------------------------------------------------------- History:   PMH:  Acquired from the patient and from the patient&'s chart.  PMH:  DVT. Stroke.  Risk factors:  Former tobacco use. Hypertension. Diabetes mellitus. Dyslipidemia.  ------------------------------------------------------------------- Study Conclusions  - Left ventricle: The cavity size was normal. There was moderate concentric hypertrophy. Systolic function was normal. The estimated ejection fraction was in the range of 50% to 55%. Doppler parameters are consistent with abnormal left ventricular relaxation (grade 1 diastolic dysfunction). Acoustic contrast opacification revealed no evidence ofthrombus. - Regional wall motion abnormality: Hypokinesis of the mid anteroseptal, mid inferoseptal, mid-apical inferior, apical septal, and apical myocardium. - Mitral valve: There was trivial regurgitation. - Right ventricle: Systolic function was normal. - Right atrium: There was a possible, small, fixed mass on the right side of the interatrial septum. This is best seen on image 51. Consider myxoma. - Atrial septum: The septum was thickened. A patent foramen ovale cannot be excluded. There was a  possiblemasson the right side of the interatrial septum. - Tricuspid valve: There was trivial regurgitation.  Impressions:  - 1. Low normal EF, but wall motion abnormalities noted. Hypokinesis of mid to distal anteroseptum, inferoseptum,   and inferior walls as well as the apex. Echo contrast (definity) used to better define endocardial borders and exclude LV thrombus)  2. Apical views suggest the presence of a small, fixed mass adjacent to the intra-atrial septum. Consider mxyoma.  ------------------------------------------------------------------- Study data:   Study status:  Routine.  Procedure:  The patient reported no pain pre or post test. Transthoracic echocardiography for left ventricular function evaluation, for right ventricular function evaluation, and for assessment of valvular function. Image quality was adequate. Intravenous contrast (Definity) was administered to enhance regional wall motion assessment and opacify the LV.  Study completion:  There were no complications. Transthoracic echocardiography.  M-mode, complete 2D, spectral Doppler, and color Doppler.  Birthdate:  Patient birthdate: 06/15/1946.  Age:  Patient is 77 yr old.  Sex:  Gender: female. BMI: 24.5 kg/m^2.  Blood pressure:     118/62  Patient status: Outpatient.  Study date:  Study date: 10/29/2018. Study time: 10:52 AM.  Location:  [facility]  -------------------------------------------------------------------  ------------------------------------------------------------------- Left ventricle:  The cavity size was normal. There was moderate concentric hypertrophy. Systolic function was normal. The estimated ejection fraction was in the range of 50% to 55%.  Acoustic contrast opacification revealed no evidence ofthrombus.  Regional wall motion abnormalities:  Hypokinesis of the mid anteroseptal, mid inferoseptal, mid-apical inferior, apical septal, and apical myocardium. Doppler parameters are  consistent with abnormal left ventricular relaxation (grade 1 diastolic dysfunction).  ------------------------------------------------------------------- Aortic valve:   Trileaflet; normal thickness leaflets. Mobility was not restricted.  Doppler:  Transvalvular velocity was within the normal range. There was no stenosis. There was no regurgitation.  ------------------------------------------------------------------- Aorta:  Aortic root: The aortic root was normal in size.  ------------------------------------------------------------------- Mitral valve:   Structurally normal valve.   Mobility was not restricted.  Doppler:  Transvalvular velocity was within the normal range. There was no evidence for stenosis. There was trivial regurgitation.  ------------------------------------------------------------------- Left atrium:  The atrium was normal in size.  ------------------------------------------------------------------- Atrial septum:  Not well visualized. The septum was thickened. A patent foramen ovale cannot be excluded. There was a possiblemasson the right side of the interatrial septum.  ------------------------------------------------------------------- Right ventricle:  The cavity size was normal. Wall thickness was normal. Systolic function was normal.  ------------------------------------------------------------------- Pulmonic valve:   Poorly visualized.  The valve appears to be grossly normal.    Doppler:  Transvalvular velocity was within the normal range. There was no evidence for stenosis. There was no significant regurgitation.  ------------------------------------------------------------------- Tricuspid valve:   Structurally normal valve.    Doppler: Transvalvular velocity was within the normal range. There was trivial regurgitation.  ------------------------------------------------------------------- Pulmonary artery:   The main pulmonary artery was  normal-sized. Systolic pressure could not be accurately estimated.  ------------------------------------------------------------------- Right atrium:  The atrium was normal in size. There was a possible, small, fixed mass on the right side of the interatrial septum. This is best seen on image 51. Consider myxoma.  ------------------------------------------------------------------- Pericardium:  There was no pericardial effusion.  ------------------------------------------------------------------- Systemic veins: Inferior vena cava: Not visualized.  ------------------------------------------------------------------- Measurements  Left ventricle                          Value        Reference LV ID, ED, PLAX chordal         (L)     36    mm     43 - 52 LV ID, ES, PLAX chordal                   25    mm     23 - 38 LV fx shortening, PLAX chordal          31    %      >=29 LV PW thickness, ED                     14    mm     ---------- IVS/LV PW ratio, ED                     1            <=1.3 Stroke volume, 2D                       54    ml     ---------- Stroke volume/bsa, 2D                   30    ml/m^2 ---------- LV e&', lateral                          4.68  cm/s   ---------- LV E/e&', lateral                        13.68        ---------- LV e&', medial                           5.66  cm/s   ---------- LV E/e&', medial                         11.31        ---------- LV e&', average                          5.17  cm/s   ---------- LV E/e&', average                        12.38        ----------  Ventricular septum                      Value        Reference IVS thickness, ED                       14    mm     ----------  LVOT                                    Value        Reference LVOT ID, S                              19    mm     ---------- LVOT area                               2.84  cm^2   ---------- LVOT ID                                   19    mm     ---------- LVOT  peak velocity, S                   98.9  cm/s   ---------- LVOT mean velocity, S                   71.7  cm/s   ---------- LVOT VTI, S                             18.9  cm     ---------- Stroke volume (SV), LVOT DP             53.6  ml     ---------- Stroke index (SV/bsa), LVOT DP          29.8  ml/m^2 ----------  Aorta                                   Value        Reference Aortic root ID, ED                      31    mm     ---------- Ascending aorta ID, A-P, S              34    mm     ----------  Left atrium                             Value        Reference LA ID, A-P, ES                          38    mm     ---------- LA ID/bsa, A-P                          2.11  cm/m^2 <=2.2 LA volume, S                            33.1  ml     ---------- LA volume/bsa, S                        18.4  ml/m^2 ---------- LA volume, ES, 1-p A4C                  30.4  ml     ---------- LA volume/bsa, ES, 1-p A4C              16.9  ml/m^2 ---------- LA volume, ES, 1-p A2C                  35.3  ml     ---------- LA volume/bsa, ES, 1-p A2C              19.6  ml/m^2 ----------  Mitral valve                            Value        Reference Mitral E-wave peak velocity             64    cm/s   ----------   Mitral A-wave peak velocity             98.5  cm/s   ---------- Mitral deceleration time        (H)     278   ms     150 - 230 Mitral E/A ratio, peak                  0.6          ----------  Right atrium                            Value        Reference RA ID, S-I, ES, A4C             (L)     33.4  mm     34 - 49 RA area, ES, A4C                (L)     7.56  cm^2   8.3 - 19.5 RA volume, ES, A/L                      14    ml     ---------- RA volume/bsa, ES, A/L                  7.8   ml/m^2 ----------  Right ventricle                         Value        Reference RV ID, minor axis, ED, A4C base         24    mm     ---------- TAPSE                                   18.9  mm     ---------- RV s&',  lateral, S                       8.81  cm/s   ----------  Legend: (L)  and  (H)  mark values outside specified reference range.  ------------------------------------------------------------------- Prepared and Electronically Authenticated by  Christopher, Bridgette 2019-11-13T14:40:19   TEE  ECHO TEE 11/24/2018  Interpretation Summary  Mitral valve: Mild leaflet thickening is present. There is a status-post balloon commissurotomy. Mild to moderate regurgitation.  Septum: No Patent Foramen Ovale present.  Left atrium: Patent foramen ovale not present.  Pulmonic valve: Trace regurgitation.  Right ventricle: Normal cavity size, wall thickness and ejection fraction.  Aortic valve: The valve is trileaflet. No stenosis. No regurgitation.  Right ventricle: Normal cavity size and ejection fraction.           Carotid US 03/13/2023: Summary:  Right Carotid: Velocities in the right ICA are consistent with a 1-39%  stenosis.                S/P CEA   Left Carotid: Velocities in the left ICA are consistent with a 1-39%  stenosis.               S/P CEA   Vertebrals:  Bilateral vertebral arteries demonstrate antegrade flow.  Subclavians: Left subclavian artery was stenotic. Bilateral subclavian  artery              flow was disturbed.        EKG:  EKG is not ordered today.    Recent Labs: No results found for requested labs within last 365 days.  Recent Lipid Panel    Component Value Date/Time   CHOL 123 11/14/2020 1614   TRIG 154 (H) 11/14/2020 1614   HDL 47 11/14/2020 1614   CHOLHDL 2.6 11/14/2020 1614   CHOLHDL 2.7 05/05/2018 0316   VLDL 19 05/05/2018 0316   LDLCALC 50 11/14/2020 1614     Risk Assessment/Calculations:           Physical Exam:    VS:  BP 116/66   Pulse 77   Ht 5' 6" (1.676 m)   Wt 139 lb 12.8 oz (63.4 kg)   SpO2 95%   BMI 22.56 kg/m     Wt Readings from Last 3 Encounters:  04/17/23 139 lb 12.8 oz (63.4 kg)  03/28/23 141 lb 3.2 oz (64  kg)  03/22/23 143 lb (64.9 kg)     GEN:  Well nourished, well developed in no acute distress HEENT: Normal NECK: No JVD; No carotid bruits LYMPHATICS: No lymphadenopathy CARDIAC: RRR, no murmurs, rubs, gallops RESPIRATORY:  Clear to auscultation without rales, wheezing or rhonchi  ABDOMEN: Soft, non-tender, non-distended MUSCULOSKELETAL:  No edema; No deformity  SKIN: Warm and dry NEUROLOGIC:  Alert and oriented x 3 PSYCHIATRIC:  Normal affect   ASSESSMENT:    1. Bilateral carotid artery disease, unspecified type (HCC)   2. Angina pectoris (HCC)   3. Essential hypertension   4. Mixed hyperlipidemia   5. Pre-procedural cardiovascular examination    PLAN:    In order of problems listed above:  Recent carotid duplex reviewed and demonstrates less than 40% bilateral ICA stenosis.  Continue medical management.  Patient on warfarin. The patient has known multivessel CAD and is status post CABG.  She is having symptoms that are very similar to what she experienced before surgery.  Initially she was felt to have had reflux symptoms but it turns out she had severe multivessel CAD when her initial coronary diagnosis was made.  It is difficult to know whether her symptoms are gastroesophageal or cardiac, but she had no reflux symptoms for a few years after her heart surgery.  Now with recurrence in light of diabetes, I think we need to have a definitive evaluation.  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  With progressive symptoms including an exertional component, I have recommended cardiac catheterization and possible PCI to assess for graft patency. I have reviewed the risks, indications, and alternatives to cardiac catheterization, possible angioplasty, and stenting with the patient. Risks include but are not limited to bleeding, infection, vascular injury, stroke, myocardial infection, arrhythmia, kidney injury,  radiation-related injury in the case of prolonged fluoroscopy use, emergency cardiac surgery, and death. The patient understands the risks of serious complication is 1-2 in 1000 with diagnostic cardiac cath and 1-2% or less with angioplasty/stenting.   Blood pressure is controlled on current regimen.  This includes lisinopril and metoprolol. Treated with a high intensity statin drug on rosuvastatin 40 mg.  LDL cholesterol is 38.      Shared Decision Making/Informed Consent The risks [stroke (1 in 1000), death (1 in 1000), kidney failure [usually temporary] (1 in 500), bleeding (1 in 200), allergic reaction [possibly serious] (1 in 200)], benefits (diagnostic support and management of coronary artery disease) and alternatives of a cardiac catheterization were discussed in detail with Ms. Morissette and she is willing to   proceed.    Medication Adjustments/Labs and Tests Ordered: Current medicines are reviewed at length with the patient today.  Concerns regarding medicines are outlined above.  Orders Placed This Encounter  Procedures   CBC   Basic metabolic panel   Protime-INR   Meds ordered this encounter  Medications   isosorbide mononitrate (IMDUR) 30 MG 24 hr tablet    Sig: Take 0.5 tablets (15 mg total) by mouth daily.    Dispense:  45 tablet    Refill:  3    Patient Instructions  Medication Instructions:  START Isosorbide Mononitrate (Imdur) 15mg daily *If you need a refill on your cardiac medications before your next appointment, please call your pharmacy*   Lab Work: CBC, BMET, PT-INR (within 3 daysn of procedure) If you have labs (blood work) drawn today and your tests are completely normal, you will receive your results only by: MyChart Message (if you have MyChart) OR A paper copy in the mail If you have any lab test that is abnormal or we need to change your treatment, we will call you to review the results.  Testing/Procedures: L heart catherization Your physician has  requested that you have a cardiac catheterization. Cardiac catheterization is used to diagnose and/or treat various heart conditions. Doctors may recommend this procedure for a number of different reasons. The most common reason is to evaluate chest pain. Chest pain can be a symptom of coronary artery disease (CAD), and cardiac catheterization can show whether plaque is narrowing or blocking your heart's arteries. This procedure is also used to evaluate the valves, as well as measure the blood flow and oxygen levels in different parts of your heart. For further information please visit www.cardiosmart.org. Please follow instruction sheet, as given.  Follow-Up: At  [facility], you and your health needs are our priority.  As part of our continuing mission to provide you with exceptional heart care, we have created designated Provider Care Teams.  These Care Teams include your primary Cardiologist (physician) and Advanced Practice Providers (APPs -  Physician Assistants and Nurse Practitioners) who all work together to provide you with the care you need, when you need it.  Your next appointment:   6 month(s)  Provider:   Weaver, Swinyer, Dunn, Conte or Dick     Other Instructions       Cardiac/Peripheral Catheterization   You are scheduled for a Cardiac Catheterization on Tuesday, May 14 with Dr. Cressie Betzler.  1. Please arrive at the [facility] (Main Entrance A) at [facility]: [address] [place], [place] [address] at 7:00 AM (This time is two hour(s) before your procedure to ensure your preparation). Free valet parking service is available. You will check in at ADMITTING. The support person will be asked to wait in the waiting room.  It is OK to have someone drop you off and come back when you are ready to be discharged.        Special note: Every effort is made to have your procedure done on time. Please understand that emergencies sometimes delay scheduled  procedures.  2. Diet: Do not eat solid foods after midnight.  You may have clear liquids until 5 AM the day of the procedure.  3. Labs: 04/26/23. You do not need to be fasting.  4. Medication instructions in preparation for your procedure:   Contrast Allergy: No  HOLD Warfarin for 4 days prior to procedure (last dose 04/25/23, resume 05/01/23) HOLD Lisinopril the day before and day   of procedure TAKE ONLY 1/2 dose of Basaglar insulin the night prior to procedure (12 units) DO NOT TAKE Potassium or Chlorthalidone the day of procedure DO NOT TAKE Metformin the day of procedure and hold for 2 days afterwards (last dose 04/29/23, resume 05/03/23)  On the morning of your procedure, take Aspirin 81 mg and any morning medicines NOT listed above.  You may use sips of water.  5. Plan to go home the same day, you will only stay overnight if medically necessary. 6. You MUST have a responsible adult to drive you home. 7. An adult MUST be with you the first 24 hours after you arrive home. 8. Bring a current list of your medications, and the last time and date medication taken. 9. Bring ID and current insurance cards. 10.Please wear clothes that are easy to get on and off and wear slip-on shoes.  Thank you for allowing us to care for you!   -- [facility]    Signed, Vivika Poythress, MD  04/19/2023 10:06 AM    [facility]  

## 2023-04-17 NOTE — Progress Notes (Signed)
Cardiology Office Note:    Date:  04/19/2023   ID:  Adriana [name], Adriana [name] [birth year], MRN [id number]  PCP:  [name], MD   [facility] Providers Cardiologist:  [name], MD     Referring MD: [name], MD   Chief Complaint  Patient presents with   Coronary Artery Disease    History of Present Illness:    Adriana [name] is a 77 y.o. female presenting for follow-up of coronary artery disease.  The patient was diagnosed with multivessel CAD in 2019 and underwent CABG at that time.  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  The patient and has vascular disease with history of bilateral carotid endarterectomy.  She is followed for diabetes, obstructive sleep apnea, and mixed hyperlipidemia.  The patient is here alone today.  She has been having a lot of trouble with "heartburn" over the last 6 months.  She states this feels very similar to her symptoms that predated CABG.  She never really had classic symptoms of chest pain or pressure, but complained of burning in her chest.  This is now recurred, but has been really difficult to determine whether it is related to true reflux or to cardiac pain.  She did have a stress test earlier this month demonstrating a gated LVEF of 58%, anteroapical scar with what appears to be mild peri-infarct ischemia.  Overall findings were improved from those prior to her bypass surgery.  The patient complains of generalized fatigue but no shortness of breath, orthopnea, PND, or heart palpitations.  States that eating worsens her symptoms and that she is not able to lie down after eating.  Much of her heartburn is at night, but also exacerbated by physical activity such as walking.  Past Medical History:  Diagnosis Date   AKI (acute kidney injury) (HCC) 05/02/2018   Carotid artery disease (HCC)    hx of bilat CEA // Carotid US 12/19: R 40-59; L  1-39   Cellulitis of right leg 05/01/2018   Coronary artery disease    Myoview 10/19 High Risk // LHC 11/19 w/ 3 v CAD // Echo 11/19: EF 50-55, gr 1 DD, ant-sept, inf-sept, apical HK // s/p CABG in 11/2018   Depression    denies   DVT (deep venous thrombosis) (HCC)    in setting of Factor V Leiden mutation   Essential hypertension    Factor V Leiden mutation (HCC)    Chronic Warfarin managed by PCP // hx of recurrent DVT; prior CVA   GERD (gastroesophageal reflux disease)    History of kidney stones    History of sepsis 05/01/2018   History of stroke 2004   right side weakness-resolved   HLD (hyperlipidemia)    Myocardial infarction (HCC)    Pneumonia    x2   PONV (postoperative nausea and vomiting)    blood pressure up/down, confusion   Poorly controlled type 2 diabetes mellitus (HCC)    Seizures (HCC)    Sleep apnea     Past Surgical History:  Procedure Laterality Date   ABDOMINAL HYSTERECTOMY  1989   BACK SURGERY     CARDIAC CATHETERIZATION     Carotid artery endarterectomy     right and left side with stents   CATARACT EXTRACTION Bilateral 2017   CHOLECYSTECTOMY     CORONARY ARTERY BYPASS GRAFT N/A 11/17/2018   Procedure: CORONARY ARTERY BYPASS GRAFTING (CABG), ON PUMP, TIMES  FIVE, USING LEFT INTERNAL MAMMARY ARTERY AND ENDOSCOPICALLY HARVESTED LEFT GREATER SAPHENOUS VEIN;  Surgeon: Alleen Borne, MD;  Location: [facility] OR;  Service: Open Heart Surgery;  Laterality: N/A;   LEFT HEART CATH AND CORONARY ANGIOGRAPHY N/A 11/04/2018   Procedure: LEFT HEART CATH AND CORONARY ANGIOGRAPHY;  Surgeon: [name], MD;  Location: [facility] INVASIVE CV LAB;  Service: Cardiovascular;  Laterality: N/A;   LITHOTRIPSY     x3   TEE WITHOUT CARDIOVERSION N/A 11/17/2018   Procedure: TRANSESOPHAGEAL ECHOCARDIOGRAM (TEE);  Surgeon: Alleen Borne, MD;  Location: [facility] OR;  Service: Open Heart Surgery;  Laterality: N/A;   TONSILLECTOMY     trigger fingers left and right Bilateral    TUBAL LIGATION       Current Medications: Current Meds  Medication Sig   ACCU-CHEK GUIDE test strip CHECK BLOOD SUGAR THREE TIMES DAILY   acetaminophen (TYLENOL) 500 MG tablet Take 500 mg by mouth every 6 (six) hours as needed.   buPROPion (WELLBUTRIN XL) 300 MG 24 hr tablet Take 300 mg by mouth daily.    chlorthalidone (HYGROTON) 25 MG tablet Take 25 mg by mouth daily.   Cholecalciferol (VITAMIN D) 2000 units CAPS Take 2,000 Units by mouth daily.    ciclopirox (PENLAC) 8 % solution Apply topically at bedtime. Apply over nail and surrounding skin. Apply daily over previous coat. After seven (7) days, may remove with alcohol and continue cycle.   Continuous Blood Gluc Sensor (FREESTYLE LIBRE 14 DAY SENSOR) MISC 1 each by Does not apply route every 14 (fourteen) days. Change every 2 weeks   cyanocobalamin 2000 MCG tablet Take 2,000 mcg by mouth daily.   Insulin Glargine (BASAGLAR KWIKPEN) 100 UNIT/ML Inject 24 Units into the skin at bedtime.   insulin lispro (HUMALOG) 100 UNIT/ML injection Inject 0.06 mLs (6 Units total) into the skin 3 (three) times daily before meals. (Patient taking differently: Inject 5 Units into the skin 3 (three) times daily before meals.)   isosorbide mononitrate (IMDUR) 30 MG 24 hr tablet Take 0.5 tablets (15 mg total) by mouth daily.   lisinopril (PRINIVIL,ZESTRIL) 5 MG tablet Take 5 mg by mouth daily.   metFORMIN (GLUCOPHAGE-XR) 750 MG 24 hr tablet Take 1,000 mg by mouth 2 (two) times daily.   metoprolol tartrate (LOPRESSOR) 25 MG tablet Take 25 mg by mouth 2 (two) times daily.   nitroGLYCERIN (NITROSTAT) 0.4 MG SL tablet Place 1 tablet (0.4 mg total) under the tongue every 5 (five) minutes as needed for chest pain.   potassium citrate (UROCIT-K) 10 MEQ (1080 MG) SR tablet Take 10 mEq by mouth 2 (two) times daily.   promethazine (PHENERGAN) 25 MG tablet Take 12.5 mg by mouth every evening.    rosuvastatin (CRESTOR) 40 MG tablet TAKE 1 TABLET(40 MG) BY MOUTH DAILY   warfarin (COUMADIN)  5 MG tablet Take by mouth See admin instructions. Take 5 mg every Monday and Friday; 2.5 mg all other days of the week   white petrolatum (VASELINE) GEL Apply 1 application topically daily as needed (for psoriasis).     Allergies:   Aspirin, Propofol, Propofol, Dapagliflozin, Ibandronic acid, Sulfa antibiotics, and Trulicity [dulaglutide]   Social History   Socioeconomic History   Marital status: Married    Spouse name: [name]   Number of children: Not on file   Years of education: 12   Highest education level: High school graduate  Occupational History   Occupation: Retired  Tobacco Use   Smoking status: Former  Packs/day: 1.00    Years: 38.00    Additional pack years: 0.00    Total pack years: 38.00    Types: Cigarettes    Quit date: 2004    Years since quitting: 20.3   Smokeless tobacco: Never  Vaping Use   Vaping Use: Never used  Substance and Sexual Activity   Alcohol use: Never   Drug use: Never   Sexual activity: Not Currently  Other Topics Concern   Not on file  Social History Narrative   Not on file   Social Determinants of Health   Financial Resource Strain: Low Risk  (02/05/2019)   Overall Financial Resource Strain (CARDIA)    Difficulty of Paying Living Expenses: Not hard at all  Food Insecurity: No Food Insecurity (02/05/2019)   Hunger Vital Sign    Worried About Running Out of Food in the Last Year: Never true    Ran Out of Food in the Last Year: Never true  Transportation Needs: No Transportation Needs (02/05/2019)   PRAPARE - Administrator, Civil Service (Medical): No    Lack of Transportation (Non-Medical): No  Physical Activity: Inactive (02/05/2019)   Exercise Vital Sign    Days of Exercise per Week: 0 days    Minutes of Exercise per Session: 0 min  Stress: No Stress Concern Present (02/05/2019)   [employer] of Occupational Health - Occupational Stress Questionnaire    Feeling of Stress : Not at all  Social Connections: Not on  file     Family History: The patient's family history includes COPD in her brother; Cataracts in her maternal grandmother; Diabetes in her brother and maternal grandmother; Lung cancer in her father; Stroke in her mother. There is no history of Amblyopia, Blindness, Glaucoma, Hypertension, Macular degeneration, Retinal detachment, Strabismus, Retinitis pigmentosa, Colon cancer, Esophageal cancer, Pancreatic cancer, or Stomach cancer.  ROS:   Please see the history of present illness.    All other systems reviewed and are negative.  EKGs/Labs/Other Studies Reviewed:    The following studies were reviewed today: Cardiac Studies & Procedures   CARDIAC CATHETERIZATION  CARDIAC CATHETERIZATION 11/04/2018  Narrative  Prox RCA lesion is 100% stenosed.  Mid LM lesion is 30% stenosed.  There is mild left ventricular systolic dysfunction.  LV end diastolic pressure is mildly elevated.  The left ventricular ejection fraction is 50-55% by visual estimate.  Ost 1st Mrg to 1st Mrg lesion is 75% stenosed.  Ost LAD lesion is 90% stenosed.  Ost 1st Diag to 1st Diag lesion is 80% stenosed.  Prox LAD-1 lesion is 80% stenosed.  Prox LAD-2 lesion is 80% stenosed.  1. 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 Chronic subtotal occlusion of the LAD, filling antegrade but also supplied by right-left collaterals 3. Severe diffuse proximal and mid-LAD and first diagonal stenoses 4. Severe diffuse OM stenosis 5. Mild segmental LV systolic dysfunction  Recommend outpatient TCTS consultation for consideration of CABG in this patient with diabetes, LV dysfunction, and multivessel CAD  Findings Coronary Findings Diagnostic  Dominance: Right  Left Main Mid LM lesion is 30% stenosed. The lesion is moderately calcified.  Left Anterior Descending Collaterals Prox LAD filled by collaterals from Ost RCA.  Ost LAD lesion is 90% stenosed.  The lesion is eccentric. The lesion is calcified. Severe ostial LAD stenosis with heavy calcification Prox LAD-1 lesion is 80% stenosed. Prox LAD-2 lesion is 80% stenosed.  First Development worker, international aid 1st Diag to  1st Diag lesion is 80% stenosed.  Left Circumflex  First Obtuse Marginal Branch Ost 1st Mrg to 1st Mrg lesion is 75% stenosed. the first OM divides into twin branches, with the superior branch showing 50% stenosis and the larger inferior branch showing diffuse 75% stenosis.  Right Coronary Artery Prox RCA lesion is 100% stenosed. The lesion is moderately calcified. collateralized from the LCA, chronic occlusion  Acute Marginal Branch Collaterals Acute Mrg filled by collaterals from Dist Cx.  Right Posterior Descending Artery Collaterals RPDA filled by collaterals from Dist Cx.  Intervention  No interventions have been documented.   STRESS TESTS  MYOCARDIAL PERFUSION IMAGING 03/25/2023  Narrative   Findings are consistent with small area of anteroapical infarction with mild peri-infarct ischemia. The study is overall low risk.   No ST deviation was noted.   LV perfusion is abnormal. There is a small segment of infarction. Possible mild periinfarct ischemia in the anterior apex extending toward the mid ventricle. Defect 1: There is a small defect with mild reduction in uptake present in the apical anterior location(s) that is partially reversible. There is normal wall motion in the defect area. Consistent with infarction.   Left ventricular function is normal. Nuclear stress EF: 58 %. The left ventricular ejection fraction is normal (55-65%). End diastolic cavity size is normal. End systolic cavity size is normal.   Prior study available for comparison from 10/29/2018. There are changes compared to prior study which appear to be improved.   ECHOCARDIOGRAM  ECHOCARDIOGRAM COMPLETE 10/29/2018  Narrative *[facility]* [address] [place], [place]  [address] [phone]  ------------------------------------------------------------------- Transthoracic Echocardiography  Patient:    Veronica, Denault MR #:       [id number] Study Date: 10/29/2018 Gender:     F Age:        72 Height:     167.6 cm Weight:     68.9 kg BSA:        1.8 m^2 Pt. Status: Room:  Dora Sims, MD REFERRING    [name], MD SONOGRAPHER  Aida Raider, RDCS PERFORMING   [facility], Outpatient ATTENDING    Cristal Deer, Bridgette  cc:  ------------------------------------------------------------------- LV EF: 50% -   55%  ------------------------------------------------------------------- Indications:      R06.02 Shortness of breath.  ECHO WITH DEFINITY.  ------------------------------------------------------------------- History:   PMH:  Acquired from the patient and from the patient&'s chart.  PMH:  DVT. Stroke.  Risk factors:  Former tobacco use. Hypertension. Diabetes mellitus. Dyslipidemia.  ------------------------------------------------------------------- Study Conclusions  - Left ventricle: The cavity size was normal. There was moderate concentric hypertrophy. Systolic function was normal. The estimated ejection fraction was in the range of 50% to 55%. Doppler parameters are consistent with abnormal left ventricular relaxation (grade 1 diastolic dysfunction). Acoustic contrast opacification revealed no evidence ofthrombus. - Regional wall motion abnormality: Hypokinesis of the mid anteroseptal, mid inferoseptal, mid-apical inferior, apical septal, and apical myocardium. - Mitral valve: There was trivial regurgitation. - Right ventricle: Systolic function was normal. - Right atrium: There was a possible, small, fixed mass on the right side of the interatrial septum. This is best seen on image 51. Consider myxoma. - Atrial septum: The septum was thickened. A patent foramen ovale cannot be excluded. There was a  possiblemasson the right side of the interatrial septum. - Tricuspid valve: There was trivial regurgitation.  Impressions:  - 1. Low normal EF, but wall motion abnormalities noted. Hypokinesis of mid to distal anteroseptum, inferoseptum,  and inferior walls as well as the apex. Echo contrast (definity) used to better define endocardial borders and exclude LV thrombus)  2. Apical views suggest the presence of a small, fixed mass adjacent to the intra-atrial septum. Consider mxyoma.  ------------------------------------------------------------------- Study data:   Study status:  Routine.  Procedure:  The patient reported no pain pre or post test. Transthoracic echocardiography for left ventricular function evaluation, for right ventricular function evaluation, and for assessment of valvular function. Image quality was adequate. Intravenous contrast (Definity) was administered to enhance regional wall motion assessment and opacify the LV.  Study completion:  There were no complications. Transthoracic echocardiography.  M-mode, complete 2D, spectral Doppler, and color Doppler.  Birthdate:  Patient birthdate: March 06, 1946.  Age:  Patient is 77 yr old.  Sex:  Gender: female. BMI: 24.5 kg/m^2.  Blood pressure:     118/62  Patient status: Outpatient.  Study date:  Study date: 10/29/2018. Study time: 10:52 AM.  Location:  [facility]  -------------------------------------------------------------------  ------------------------------------------------------------------- Left ventricle:  The cavity size was normal. There was moderate concentric hypertrophy. Systolic function was normal. The estimated ejection fraction was in the range of 50% to 55%.  Acoustic contrast opacification revealed no evidence ofthrombus.  Regional wall motion abnormalities:  Hypokinesis of the mid anteroseptal, mid inferoseptal, mid-apical inferior, apical septal, and apical myocardium. Doppler parameters are  consistent with abnormal left ventricular relaxation (grade 1 diastolic dysfunction).  ------------------------------------------------------------------- Aortic valve:   Trileaflet; normal thickness leaflets. Mobility was not restricted.  Doppler:  Transvalvular velocity was within the normal range. There was no stenosis. There was no regurgitation.  ------------------------------------------------------------------- Aorta:  Aortic root: The aortic root was normal in size.  ------------------------------------------------------------------- Mitral valve:   Structurally normal valve.   Mobility was not restricted.  Doppler:  Transvalvular velocity was within the normal range. There was no evidence for stenosis. There was trivial regurgitation.  ------------------------------------------------------------------- Left atrium:  The atrium was normal in size.  ------------------------------------------------------------------- Atrial septum:  Not well visualized. The septum was thickened. A patent foramen ovale cannot be excluded. There was a possiblemasson the right side of the interatrial septum.  ------------------------------------------------------------------- Right ventricle:  The cavity size was normal. Wall thickness was normal. Systolic function was normal.  ------------------------------------------------------------------- Pulmonic valve:   Poorly visualized.  The valve appears to be grossly normal.    Doppler:  Transvalvular velocity was within the normal range. There was no evidence for stenosis. There was no significant regurgitation.  ------------------------------------------------------------------- Tricuspid valve:   Structurally normal valve.    Doppler: Transvalvular velocity was within the normal range. There was trivial regurgitation.  ------------------------------------------------------------------- Pulmonary artery:   The main pulmonary artery was  normal-sized. Systolic pressure could not be accurately estimated.  ------------------------------------------------------------------- Right atrium:  The atrium was normal in size. There was a possible, small, fixed mass on the right side of the interatrial septum. This is best seen on image 51. Consider myxoma.  ------------------------------------------------------------------- Pericardium:  There was no pericardial effusion.  ------------------------------------------------------------------- Systemic veins: Inferior vena cava: Not visualized.  ------------------------------------------------------------------- Measurements  Left ventricle                          Value        Reference LV ID, ED, PLAX chordal         (L)     36    mm     43 - 52 LV ID, ES, PLAX chordal  25    mm     23 - 38 LV fx shortening, PLAX chordal          31    %      >=29 LV PW thickness, ED                     14    mm     ---------- IVS/LV PW ratio, ED                     1            <=1.3 Stroke volume, 2D                       54    ml     ---------- Stroke volume/bsa, 2D                   30    ml/m^2 ---------- LV e&', lateral                          4.68  cm/s   ---------- LV E/e&', lateral                        13.68        ---------- LV e&', medial                           5.66  cm/s   ---------- LV E/e&', medial                         11.31        ---------- LV e&', average                          5.17  cm/s   ---------- LV E/e&', average                        12.38        ----------  Ventricular septum                      Value        Reference IVS thickness, ED                       14    mm     ----------  LVOT                                    Value        Reference LVOT ID, S                              19    mm     ---------- LVOT area                               2.84  cm^2   ---------- LVOT ID  19    mm     ---------- LVOT  peak velocity, S                   98.9  cm/s   ---------- LVOT mean velocity, S                   71.7  cm/s   ---------- LVOT VTI, S                             18.9  cm     ---------- Stroke volume (SV), LVOT DP             53.6  ml     ---------- Stroke index (SV/bsa), LVOT DP          29.8  ml/m^2 ----------  Aorta                                   Value        Reference Aortic root ID, ED                      31    mm     ---------- Ascending aorta ID, A-P, S              34    mm     ----------  Left atrium                             Value        Reference LA ID, A-P, ES                          38    mm     ---------- LA ID/bsa, A-P                          2.11  cm/m^2 <=2.2 LA volume, S                            33.1  ml     ---------- LA volume/bsa, S                        18.4  ml/m^2 ---------- LA volume, ES, 1-p A4C                  30.4  ml     ---------- LA volume/bsa, ES, 1-p A4C              16.9  ml/m^2 ---------- LA volume, ES, 1-p A2C                  35.3  ml     ---------- LA volume/bsa, ES, 1-p A2C              19.6  ml/m^2 ----------  Mitral valve                            Value        Reference Mitral E-wave peak velocity             64    cm/s   ----------  Mitral A-wave peak velocity             98.5  cm/s   ---------- Mitral deceleration time        (H)     278   ms     150 - 230 Mitral E/A ratio, peak                  0.6          ----------  Right atrium                            Value        Reference RA ID, S-I, ES, A4C             (L)     33.4  mm     34 - 49 RA area, ES, A4C                (L)     7.56  cm^2   8.3 - 19.5 RA volume, ES, A/L                      14    ml     ---------- RA volume/bsa, ES, A/L                  7.8   ml/m^2 ----------  Right ventricle                         Value        Reference RV ID, minor axis, ED, A4C base         24    mm     ---------- TAPSE                                   18.9  mm     ---------- RV s&',  lateral, S                       8.81  cm/s   ----------  Legend: (L)  and  (H)  mark values outside specified reference range.  ------------------------------------------------------------------- Prepared and Electronically Authenticated by  Jodelle Red 2019-11-13T14:40:19   TEE  ECHO TEE 11/24/2018  Interpretation Summary  Mitral valve: Mild leaflet thickening is present. There is a status-post balloon commissurotomy. Mild to moderate regurgitation.  Septum: No Patent Foramen Ovale present.  Left atrium: Patent foramen ovale not present.  Pulmonic valve: Trace regurgitation.  Right ventricle: Normal cavity size, wall thickness and ejection fraction.  Aortic valve: The valve is trileaflet. No stenosis. No regurgitation.  Right ventricle: Normal cavity size and ejection fraction.           Carotid US 03/13/2023: Summary:  Right Carotid: Velocities in the right ICA are consistent with a 1-39%  stenosis.                S/P CEA   Left Carotid: Velocities in the left ICA are consistent with a 1-39%  stenosis.               S/P CEA   Vertebrals:  Bilateral vertebral arteries demonstrate antegrade flow.  Subclavians: Left subclavian artery was stenotic. Bilateral subclavian  artery              flow was disturbed.  EKG:  EKG is not ordered today.    Recent Labs: No results found for requested labs within last 365 days.  Recent Lipid Panel    Component Value Date/Time   CHOL 123 11/14/2020 1614   TRIG 154 (H) 11/14/2020 1614   HDL 47 11/14/2020 1614   CHOLHDL 2.6 11/14/2020 1614   CHOLHDL 2.7 05/05/2018 0316   VLDL 19 05/05/2018 0316   LDLCALC 50 11/14/2020 1614     Risk Assessment/Calculations:           Physical Exam:    VS:  BP 116/66   Pulse 77   Ht 5\' 6"  (1.676 m)   Wt 139 lb 12.8 oz (63.4 kg)   SpO2 95%   BMI 22.56 kg/m     Wt Readings from Last 3 Encounters:  04/17/23 139 lb 12.8 oz (63.4 kg)  03/28/23 141 lb 3.2 oz (64  kg)  03/22/23 143 lb (64.9 kg)     GEN:  Well nourished, well developed in no acute distress HEENT: Normal NECK: No JVD; No carotid bruits LYMPHATICS: No lymphadenopathy CARDIAC: RRR, no murmurs, rubs, gallops RESPIRATORY:  Clear to auscultation without rales, wheezing or rhonchi  ABDOMEN: Soft, non-tender, non-distended MUSCULOSKELETAL:  No edema; No deformity  SKIN: Warm and dry NEUROLOGIC:  Alert and oriented x 3 PSYCHIATRIC:  Normal affect   ASSESSMENT:    1. Bilateral carotid artery disease, unspecified type (HCC)   2. Angina pectoris (HCC)   3. Essential hypertension   4. Mixed hyperlipidemia   5. Pre-procedural cardiovascular examination    PLAN:    In order of problems listed above:  Recent carotid duplex reviewed and demonstrates less than 40% bilateral ICA stenosis.  Continue medical management.  Patient on warfarin. The patient has known multivessel CAD and is status post CABG.  She is having symptoms that are very similar to what she experienced before surgery.  Initially she was felt to have had reflux symptoms but it turns out she had severe multivessel CAD when her initial coronary diagnosis was made.  It is difficult to know whether her symptoms are gastroesophageal or cardiac, but she had no reflux symptoms for a few years after her heart surgery.  Now with recurrence in light of diabetes, I think we need to have a definitive evaluation.  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  With progressive symptoms including an exertional component, I have recommended cardiac catheterization and possible PCI to assess for graft patency. I have reviewed the risks, indications, and alternatives to cardiac catheterization, possible angioplasty, and stenting with the patient. Risks include but are not limited to bleeding, infection, vascular injury, stroke, myocardial infection, arrhythmia, kidney injury,  radiation-related injury in the case of prolonged fluoroscopy use, emergency cardiac surgery, and death. The patient understands the risks of serious complication is 1-2 in 1000 with diagnostic cardiac cath and 1-2% or less with angioplasty/stenting.   Blood pressure is controlled on current regimen.  This includes lisinopril and metoprolol. Treated with a high intensity statin drug on rosuvastatin 40 mg.  LDL cholesterol is 38.      Shared Decision Making/Informed Consent The risks [stroke (1 in 1000), death (1 in 1000), kidney failure [usually temporary] (1 in 500), bleeding (1 in 200), allergic reaction [possibly serious] (1 in 200)], benefits (diagnostic support and management of coronary artery disease) and alternatives of a cardiac catheterization were discussed in detail with Ms. Ratts and she is willing to  proceed.    Medication Adjustments/Labs and Tests Ordered: Current medicines are reviewed at length with the patient today.  Concerns regarding medicines are outlined above.  Orders Placed This Encounter  Procedures   CBC   Basic metabolic panel   Protime-INR   Meds ordered this encounter  Medications   isosorbide mononitrate (IMDUR) 30 MG 24 hr tablet    Sig: Take 0.5 tablets (15 mg total) by mouth daily.    Dispense:  45 tablet    Refill:  3    Patient Instructions  Medication Instructions:  START Isosorbide Mononitrate (Imdur) 15mg  daily *If you need a refill on your cardiac medications before your next appointment, please call your pharmacy*   Lab Work: CBC, BMET, PT-INR (within 3 daysn of procedure) If you have labs (blood work) drawn today and your tests are completely normal, you will receive your results only by: MyChart Message (if you have MyChart) OR A paper copy in the mail If you have any lab test that is abnormal or we need to change your treatment, we will call you to review the results.  Testing/Procedures: L heart catherization Your physician has  requested that you have a cardiac catheterization. Cardiac catheterization is used to diagnose and/or treat various heart conditions. Doctors may recommend this procedure for a number of different reasons. The most common reason is to evaluate chest pain. Chest pain can be a symptom of coronary artery disease (CAD), and cardiac catheterization can show whether plaque is narrowing or blocking your heart's arteries. This procedure is also used to evaluate the valves, as well as measure the blood flow and oxygen levels in different parts of your heart. For further information please visit [email or web address] Please follow instruction sheet, as given.  Follow-Up: At [facility], you and your health needs are our priority.  As part of our continuing mission to provide you with exceptional heart care, we have created designated Provider Care Teams.  These Care Teams include your primary Cardiologist (physician) and Advanced Practice Providers (APPs -  Physician Assistants and Nurse Practitioners) who all work together to provide you with the care you need, when you need it.  Your next appointment:   6 month(s)  Provider:   Suzzanne Cloud, Baxter Kail or Dick     Other Instructions       Cardiac/Peripheral Catheterization   You are scheduled for a Cardiac Catheterization on Tuesday, May 14 with Dr. [name].  1. Please arrive at the [facility] (Main Entrance A) at [facility]: [address] [place], [place] [address] at 7:00 AM (This time is two hour(s) before your procedure to ensure your preparation). Free valet parking service is available. You will check in at ADMITTING. The support person will be asked to wait in the waiting room.  It is OK to have someone drop you off and come back when you are ready to be discharged.        Special note: Every effort is made to have your procedure done on time. Please understand that emergencies sometimes delay scheduled  procedures.  2. Diet: Do not eat solid foods after midnight.  You may have clear liquids until 5 AM the day of the procedure.  3. Labs: 04/26/23. You do not need to be fasting.  4. Medication instructions in preparation for your procedure:   Contrast Allergy: No  HOLD Warfarin for 4 days prior to procedure (last dose 04/25/23, resume 05/01/23) HOLD Lisinopril the day before and day  of procedure TAKE ONLY 1/2 dose of Basaglar insulin the night prior to procedure (12 units) DO NOT TAKE Potassium or Chlorthalidone the day of procedure DO NOT TAKE Metformin the day of procedure and hold for 2 days afterwards (last dose 04/29/23, resume 05/03/23)  On the morning of your procedure, take Aspirin 81 mg and any morning medicines NOT listed above.  You may use sips of water.  5. Plan to go home the same day, you will only stay overnight if medically necessary. 6. You MUST have a responsible adult to drive you home. 7. An adult MUST be with you the first 24 hours after you arrive home. 8. Bring a current list of your medications, and the last time and date medication taken. 9. Bring ID and current insurance cards. 10.Please wear clothes that are easy to get on and off and wear slip-on shoes.  Thank you for allowing [place] to care for you!   -- [facility] Invasive Cardiovascular services    Signed, [name], MD  04/19/2023 10:06 AM    [facility]

## 2023-04-19 ENCOUNTER — Encounter: Payer: Self-pay | Admitting: Cardiovascular Disease

## 2023-04-23 ENCOUNTER — Ambulatory Visit: Payer: Federal, State, Local not specified - PPO | Admitting: Neurology

## 2023-04-23 DIAGNOSIS — I739 Peripheral vascular disease, unspecified: Secondary | ICD-10-CM

## 2023-04-23 DIAGNOSIS — Z789 Other specified health status: Secondary | ICD-10-CM

## 2023-04-23 DIAGNOSIS — F40240 Claustrophobia: Secondary | ICD-10-CM

## 2023-04-23 DIAGNOSIS — G4733 Obstructive sleep apnea (adult) (pediatric): Secondary | ICD-10-CM

## 2023-04-23 DIAGNOSIS — Z951 Presence of aortocoronary bypass graft: Secondary | ICD-10-CM

## 2023-04-23 DIAGNOSIS — I4891 Unspecified atrial fibrillation: Secondary | ICD-10-CM

## 2023-04-25 NOTE — Progress Notes (Signed)
[name] SLEEP TEST REPORT ( by  Watch PAT)   STUDY DATE:  04-23-2023 Adriana [name] Adriana [name] 76 year old female 1946-02-21    ORDERING CLINICIAN:  REFERRING CLINICIAN:  Laurene Footman, MD , cc Dr Adriana [name].    CLINICAL INFORMATION/HISTORY: Stroke patient  with atrial fib, OSA  and chronic hypoxia of sleep. Chief concern according to patient :  Am I an INSPIRE candidate ?  I have the pleasure of seeing Adriana [name] , a meanwhile 77 year old Caucasian married mother for OSA therapy discussion-  with a history of delayed sleep phase syndrome-circadian rhythm disorder, who had also a stroke in the remote past ( 2005) and  sepsis in 2019, heart disease with MI in 2020. She had already had a sleep test previously in [place] ( over 10 years ago)  and had been told that she had severe sleep apnea.   She was diagnosed at [facility]  with mild obstructive sleep apnea which was associated with significant sleep hypoxia.  The patient did not tolerate CPAP at home and has been without any treatment for the last 3 years.   She underwent a CPAP titration in lab which allowed [place] to fit her to a mask and she did tolerate the mask in the lab rather well.  My technician stated that she struggled to breeze with a nasal mask but did not want to try a full facemask so he used a bilevel machine rather than CPAP and used a AirFit N30 I in small.      Epworth sleepiness score: 2 /24.  FSS at 31/ 63 points.    BMI: 22.8 kg/m   Neck Circumference: 17"   FINDINGS:   Sleep Summary:   Total Recording Time (hours, min):    7 hours 9 minutes   Total Sleep Time (hours, min): 5 hours 48 minutes               Percent REM (%):      24.3%                                  Respiratory Indices by AASM criteria:   Calculated pAHI (per hour):     33/h           , and 21.1/h by CMS criteria.             REM pAHI:   33.4/h       and 14.2/h by CMS criteria.                                        NREM pAHI:      32.9/h     , and 23.3/h by CMS criteria                        AHI: By sleep position this patient slept mostly on her right side associated with an AHI of 26/h ,followed by 133 minutes in supine position with an AHI of 44.3/h.      Snoring reached a mean volume of 41 dB and peaked occasionally to over 50 dB.  Mild to moderate snoring level was recorded for 38% of total sleep time.  Oxygen Saturation Statistics:       O2 Saturation Range (%):     Between a nadir at 85% of maximum of 99% with a mean saturation of 94%.  Total number of desaturation events was 125.                                  O2 Saturation (minutes) <89%:      0.7 minutes  Pulse Rate Statistics:   Pulse Mean (bpm):    69 bpm             Pulse Range: Between 50 and 82 bpm               IMPRESSION:  This HST confirms the presence of severe sleep apnea following the same scoring criteria as in her last sleep study.  This time the AHI was higher the time in low oxygen was shorter- Applying the CMS criteria , there was still a moderate severe degree of apnea present.  This is not a REM sleep dependent apnea but the oxygen desaturation events were more common during REM sleep. Overall hypoxia was mild this time.     RECOMMENDATION:I would much prefer for patient with atrial fibrillation to be on positive airway pressure therapy over dental devices or inspire implantation, which reduce apnea often only by 50 to 70% and it is difficult to measure this therapeutic effect without repeating sleep studies.  Positive airway pressure therapy however allows [place] to see diagnostic data.  Given her reduced sleep quality during the previous attempts of positive airway pressure therapy however, I would think that a dental device may be the least invasive "partial therapy". Considering that she may reach a mild degree of apnea by using a dental device it  would be preferable over not achieving any reduction in apnea hypopnea or snoring at all.    INTERPRETING PHYSICIAN:   Adriana Novas, MD    [facility].

## 2023-04-26 ENCOUNTER — Ambulatory Visit: Payer: Federal, State, Local not specified - PPO | Attending: Cardiovascular Disease

## 2023-04-26 DIAGNOSIS — I10 Essential (primary) hypertension: Secondary | ICD-10-CM

## 2023-04-26 DIAGNOSIS — E782 Mixed hyperlipidemia: Secondary | ICD-10-CM

## 2023-04-26 DIAGNOSIS — Z01810 Encounter for preprocedural cardiovascular examination: Secondary | ICD-10-CM

## 2023-04-26 DIAGNOSIS — I209 Angina pectoris, unspecified: Secondary | ICD-10-CM

## 2023-04-26 DIAGNOSIS — I779 Disorder of arteries and arterioles, unspecified: Secondary | ICD-10-CM

## 2023-04-27 LAB — CBC
Hematocrit: 39.7 % (ref 34.0–46.6)
Hemoglobin: 12.8 g/dL (ref 11.1–15.9)
MCH: 29.4 pg (ref 26.6–33.0)
MCHC: 32.2 g/dL (ref 31.5–35.7)
MCV: 91 fL (ref 79–97)
Platelets: 261 10*3/uL (ref 150–450)
RBC: 4.36 x10E6/uL (ref 3.77–5.28)
RDW: 13.2 % (ref 11.7–15.4)
WBC: 8.8 10*3/uL (ref 3.4–10.8)

## 2023-04-27 LAB — BASIC METABOLIC PANEL
BUN/Creatinine Ratio: 18 (ref 12–28)
BUN: 23 mg/dL (ref 8–27)
CO2: 27 mmol/L (ref 20–29)
Calcium: 9.9 mg/dL (ref 8.7–10.3)
Chloride: 100 mmol/L (ref 96–106)
Creatinine, Ser: 1.26 mg/dL — ABNORMAL HIGH (ref 0.57–1.00)
Glucose: 270 mg/dL — ABNORMAL HIGH (ref 70–99)
Potassium: 5.1 mmol/L (ref 3.5–5.2)
Sodium: 141 mmol/L (ref 134–144)
eGFR: 45 mL/min/{1.73_m2} — ABNORMAL LOW (ref >59)

## 2023-04-27 LAB — PROTIME-INR
INR: 3 — ABNORMAL HIGH (ref 0.9–1.2)
Prothrombin Time: 29.7 s — ABNORMAL HIGH (ref 9.1–12.0)

## 2023-04-29 ENCOUNTER — Telehealth: Payer: Self-pay | Admitting: *Deleted

## 2023-04-29 NOTE — Telephone Encounter (Addendum)
Cardiac Catheterization scheduled at [facility] for: Tuesday Apr 30, 2023 9 AM Arrival time [facility] Main Entrance A at 7 AM  Nothing to eat after midnight prior to procedure, clear liquids until 5 AM day of procedure.  Medication instructions: -Hold:  Warfarin-none 04/26/23 until post procedure  Patient reports Dr Evlyn Kanner manages warfarin-she will notify Dr Evlyn Kanner warfarin has been on hold for cath  Insulin-1/2 usual dose HS prior to procedure/no Insulin AM of procedure  Metformin-day of procedure and 48 hours post procedure  Lisinopril -day before and day of procedure-per protocol GFR 45   Chlorthalidone/KCl-day before and day of procedure -per protocol GFR 45 -Other usual morning medications can be taken with sips of water including aspirin 81 mg.  Patient reports she can take this dose aspirin.   Confirmed patient has responsible adult to drive home post procedure and be with patient first 24 hours after arriving home.  Plan to go home the same day, you will only stay overnight if medically necessary.  Reviewed procedure instructions with patient.

## 2023-04-29 NOTE — Procedures (Signed)
[facility]   HOME SLEEP TEST REPORT ( by  Watch PAT)   STUDY DATE:  04-23-2023 Laloni Kasprzyk 77 year old female 1946-05-17    ORDERING CLINICIAN:  REFERRING CLINICIAN:  Laurene Footman, MD , cc Dr Excell Seltzer.    CLINICAL INFORMATION/HISTORY: Stroke patient  with atrial fib, OSA  and chronic hypoxia of sleep. Chief concern according to patient :  Am I an INSPIRE candidate ?  I have the pleasure of seeing [place] L. Reever , a meanwhile 77 year old Caucasian married mother for OSA therapy discussion-  with a history of delayed sleep phase syndrome-circadian rhythm disorder, who had also a stroke in the remote past ( 2005) and  sepsis in 2019, heart disease with MI in 2020. She had already had a sleep test previously in [place] ( over 10 years ago)  and had been told that she had severe sleep apnea.   She was diagnosed at [facility]  with mild obstructive sleep apnea which was associated with significant sleep hypoxia.  The patient did not tolerate CPAP at home and has been without any treatment for the last 3 years.   She underwent a CPAP titration in lab which allowed [place] to fit her to a mask and she did tolerate the mask in the lab rather well.  My technician stated that she struggled to breeze with a nasal mask but did not want to try a full facemask so he used a bilevel machine rather than CPAP and used a AirFit N30 I in small.      Epworth sleepiness score: 2 /24.  FSS at 31/ 63 points.    BMI: 22.8 kg/m   Neck Circumference: 17"   FINDINGS:   Sleep Summary:   Total Recording Time (hours, min):    7 hours 9 minutes   Total Sleep Time (hours, min): 5 hours 48 minutes               Percent REM (%):      24.3%                                  Respiratory Indices by AASM criteria:   Calculated pAHI (per hour):     33/h           , and 21.1/h by CMS criteria.             REM pAHI:   33.4/h       and 14.2/h by CMS criteria.                                        NREM pAHI:      32.9/h     , and 23.3/h by CMS criteria                        AHI: By sleep position this patient slept mostly on her right side associated with an AHI of 26/h ,followed by 133 minutes in supine position with an AHI of 44.3/h.      Snoring reached a mean volume of 41 dB and peaked occasionally to over 50 dB.  Mild to moderate snoring level was recorded for 38% of total sleep time.  Oxygen Saturation Statistics:       O2 Saturation Range (%):     Between a nadir at 85% of maximum of 99% with a mean saturation of 94%.  Total number of desaturation events was 125.                                  O2 Saturation (minutes) <89%:      0.7 minutes  Pulse Rate Statistics:   Pulse Mean (bpm):    69 bpm             Pulse Range: Between 50 and 82 bpm               IMPRESSION:  This HST confirms the presence of severe sleep apnea following the same scoring criteria as in her last sleep study.  This time the AHI was higher the time in low oxygen was shorter- Applying the CMS criteria , there was still a moderate severe degree of apnea present.  This is not a REM sleep dependent apnea but the oxygen desaturation events were more common during REM sleep. Overall hypoxia was mild this time.     RECOMMENDATION:I would much prefer for patient with atrial fibrillation to be on positive airway pressure therapy over dental devices or inspire implantation, which reduce apnea often only by 50 to 70% and it is difficult to measure this therapeutic effect without repeating sleep studies.  Positive airway pressure therapy however allows [place] to see diagnostic data.  Given her reduced sleep quality during the previous attempts of positive airway pressure therapy however, I would think that a dental device may be the least invasive "partial therapy". Considering that she may reach a mild degree of apnea by using a dental device it would be  preferable over not achieving any reduction in apnea hypopnea or snoring at all.    INTERPRETING PHYSICIAN:   Melvyn Novas, MD    [facility] at [facility].

## 2023-04-29 NOTE — Telephone Encounter (Signed)
Per Dr Daine Gip prep should be ok (no pre-procedure hydration).

## 2023-04-29 NOTE — Telephone Encounter (Signed)
Patient reports past history of nausea after anesthesia and is usually given something for nausea prior to anesthesia.

## 2023-04-30 ENCOUNTER — Ambulatory Visit (HOSPITAL_COMMUNITY)
Admission: RE | Admit: 2023-04-30 | Discharge: 2023-04-30 | Disposition: A | Discharge status: Home / Self Care | Payer: Federal, State, Local not specified - PPO | Attending: Cardiovascular Disease | Admitting: Cardiovascular Disease

## 2023-04-30 ENCOUNTER — Ambulatory Visit (HOSPITAL_COMMUNITY)
Admission: RE | Disposition: A | Discharge status: Home / Self Care | Payer: Self-pay | Source: Home / Self Care | Attending: Cardiovascular Disease

## 2023-04-30 ENCOUNTER — Other Ambulatory Visit: Payer: Self-pay

## 2023-04-30 DIAGNOSIS — I2582 Chronic total occlusion of coronary artery: Secondary | ICD-10-CM | POA: Diagnosis not present

## 2023-04-30 DIAGNOSIS — Z01812 Encounter for preprocedural laboratory examination: Secondary | ICD-10-CM

## 2023-04-30 DIAGNOSIS — I10 Essential (primary) hypertension: Secondary | ICD-10-CM | POA: Insufficient documentation

## 2023-04-30 DIAGNOSIS — I2584 Coronary atherosclerosis due to calcified coronary lesion: Secondary | ICD-10-CM | POA: Diagnosis not present

## 2023-04-30 DIAGNOSIS — Z951 Presence of aortocoronary bypass graft: Secondary | ICD-10-CM | POA: Diagnosis not present

## 2023-04-30 DIAGNOSIS — I6523 Occlusion and stenosis of bilateral carotid arteries: Secondary | ICD-10-CM | POA: Diagnosis not present

## 2023-04-30 DIAGNOSIS — E782 Mixed hyperlipidemia: Secondary | ICD-10-CM | POA: Insufficient documentation

## 2023-04-30 DIAGNOSIS — I25119 Atherosclerotic heart disease of native coronary artery with unspecified angina pectoris: Secondary | ICD-10-CM | POA: Diagnosis not present

## 2023-04-30 DIAGNOSIS — Z87891 Personal history of nicotine dependence: Secondary | ICD-10-CM | POA: Diagnosis not present

## 2023-04-30 DIAGNOSIS — I25810 Atherosclerosis of coronary artery bypass graft(s) without angina pectoris: Secondary | ICD-10-CM | POA: Diagnosis not present

## 2023-04-30 HISTORY — PX: LEFT HEART CATH AND CORS/GRAFTS ANGIOGRAPHY: CATH118250

## 2023-04-30 LAB — PROTIME-INR
INR: 1 (ref 0.8–1.2)
Prothrombin Time: 13.5 seconds (ref 11.4–15.2)

## 2023-04-30 LAB — GLUCOSE, CAPILLARY
Glucose-Capillary: 136 mg/dL — ABNORMAL HIGH (ref 70–99)
Glucose-Capillary: 100 mg/dL — ABNORMAL HIGH (ref 70–99)

## 2023-04-30 SURGERY — LEFT HEART CATH AND CORS/GRAFTS ANGIOGRAPHY
Anesthesia: LOCAL

## 2023-04-30 MED ORDER — SODIUM CHLORIDE 0.9% FLUSH: 3.0000 mL | Freq: Two times a day (BID) | INTRAVENOUS | Status: DC

## 2023-04-30 MED ORDER — SODIUM CHLORIDE 0.9 % IV SOLN: 250.0000 mL | INTRAVENOUS | Status: DC | PRN

## 2023-04-30 MED ORDER — FENTANYL CITRATE (PF) 100 MCG/2ML IJ SOLN
INTRAMUSCULAR | Status: DC | PRN
  Administered 2023-04-30 (×2): 25 ug via INTRAVENOUS

## 2023-04-30 MED ORDER — SODIUM CHLORIDE 0.9 % WEIGHT BASED INFUSION: 1.0000 mL/kg/h | INTRAVENOUS | Status: DC

## 2023-04-30 MED ORDER — SODIUM CHLORIDE 0.9% FLUSH: 3.0000 mL | INTRAVENOUS | Status: DC | PRN

## 2023-04-30 MED ORDER — ACETAMINOPHEN 325 MG PO TABS: 650.0000 mg | ORAL_TABLET | ORAL | Status: DC | PRN

## 2023-04-30 MED ORDER — LABETALOL HCL 5 MG/ML IV SOLN: 10.0000 mg | INTRAVENOUS | Status: DC | PRN

## 2023-04-30 MED ORDER — MIDAZOLAM HCL 2 MG/2ML IJ SOLN
INTRAMUSCULAR | Status: DC | PRN
  Administered 2023-04-30 (×2): 1 mg via INTRAVENOUS

## 2023-04-30 MED ORDER — LIDOCAINE HCL (PF) 1 % IJ SOLN
INTRAMUSCULAR | Status: AC
  Filled 2023-04-30: qty 30

## 2023-04-30 MED ORDER — HEPARIN (PORCINE) IN NACL 1000-0.9 UT/500ML-% IV SOLN
INTRAVENOUS | Status: DC | PRN
  Administered 2023-04-30 (×2): 500 mL

## 2023-04-30 MED ORDER — ASPIRIN 81 MG PO CHEW: 81.0000 mg | CHEWABLE_TABLET | ORAL | Status: DC

## 2023-04-30 MED ORDER — IOHEXOL 350 MG/ML SOLN
INTRAVENOUS | Status: DC | PRN
  Administered 2023-04-30: 60 mL

## 2023-04-30 MED ORDER — MIDAZOLAM HCL 2 MG/2ML IJ SOLN
INTRAMUSCULAR | Status: AC
  Filled 2023-04-30: qty 2

## 2023-04-30 MED ORDER — FENTANYL CITRATE (PF) 100 MCG/2ML IJ SOLN
INTRAMUSCULAR | Status: AC
  Filled 2023-04-30: qty 2

## 2023-04-30 MED ORDER — ONDANSETRON HCL 4 MG/2ML IJ SOLN
INTRAMUSCULAR | Status: DC | PRN
  Administered 2023-04-30: 4 mg via INTRAVENOUS

## 2023-04-30 MED ORDER — VERAPAMIL HCL 2.5 MG/ML IV SOLN
INTRAVENOUS | Status: AC
  Filled 2023-04-30: qty 2

## 2023-04-30 MED ORDER — LIDOCAINE HCL (PF) 1 % IJ SOLN
INTRAMUSCULAR | Status: DC | PRN
  Administered 2023-04-30: 15 mL via INTRADERMAL

## 2023-04-30 MED ORDER — SODIUM CHLORIDE 0.9 % WEIGHT BASED INFUSION
3.0000 mL/kg/h | INTRAVENOUS | Status: AC
  Administered 2023-04-30: 3 mL/kg/h via INTRAVENOUS

## 2023-04-30 MED ORDER — HYDRALAZINE HCL 20 MG/ML IJ SOLN: 10.0000 mg | INTRAMUSCULAR | Status: DC | PRN

## 2023-04-30 SURGICAL SUPPLY — 14 items
CATH INFINITI 5 FR MPA2 (CATHETERS) IMPLANT
CATH INFINITI 5FR MULTPACK ANG (CATHETERS) IMPLANT
CLOSURE MYNX CONTROL 5F (Vascular Products) IMPLANT
ELECT DEFIB PAD ADLT CADENCE (PAD) IMPLANT
GLIDESHEATH SLEND SS 6F .021 (SHEATH) IMPLANT
GUIDEWIRE INQWIRE 1.5J.035X260 (WIRE) IMPLANT
INQWIRE 1.5J .035X260CM (WIRE) ×1
KIT HEART LEFT (KITS) ×1 IMPLANT
KIT MICROPUNCTURE NIT STIFF (SHEATH) IMPLANT
PACK CARDIAC CATHETERIZATION (CUSTOM PROCEDURE TRAY) ×1 IMPLANT
SHEATH PINNACLE 5F 10CM (SHEATH) IMPLANT
SHEATH PROBE COVER 6X72 (BAG) IMPLANT
TRANSDUCER W/STOPCOCK (MISCELLANEOUS) ×1 IMPLANT
TUBING CIL FLEX 10 FLL-RA (TUBING) ×1 IMPLANT

## 2023-04-30 NOTE — Interval H&P Note (Signed)
History and Physical Interval Note:  04/30/2023 8:25 AM  Adriana [name]  has presented today for surgery, with the diagnosis of angina.  The various methods of treatment have been discussed with the patient and family. After consideration of risks, benefits and other options for treatment, the patient has consented to  Procedure(s): LEFT HEART CATH AND CORS/GRAFTS ANGIOGRAPHY (N/A) as a surgical intervention.  The patient's history has been reviewed, patient examined, no change in status, stable for surgery.  I have reviewed the patient's chart and labs.  Questions were answered to the patient's satisfaction.     [name]

## 2023-05-01 ENCOUNTER — Other Ambulatory Visit: Payer: Self-pay | Admitting: Cardiovascular Disease

## 2023-05-01 ENCOUNTER — Encounter (HOSPITAL_COMMUNITY): Payer: Self-pay | Admitting: Cardiovascular Disease

## 2023-05-01 MED FILL — Verapamil HCl IV Soln 2.5 MG/ML: INTRAVENOUS | Qty: 2 | Status: AC

## 2023-05-02 ENCOUNTER — Telehealth: Payer: Self-pay

## 2023-05-02 DIAGNOSIS — G4733 Obstructive sleep apnea (adult) (pediatric): Secondary | ICD-10-CM

## 2023-05-02 DIAGNOSIS — Z789 Other specified health status: Secondary | ICD-10-CM

## 2023-05-02 NOTE — Telephone Encounter (Signed)
-----   Message from Melvyn Novas, MD sent at 04/29/2023  6:11 PM EDT ----- Dear Mrs. Furbush,  This HST showed more severe OSA than the last test,but less oxygen desaturation. My first choice is predictably a CPAP or biPAP:  but if you cannot use PAP therapy the second best option would be a dental device for snoring and apnea reduction.  It will not alleviate all apnea, but it can turn a severe apnea degree into a moderate degree.   Let me know if you like a referral to sleep dentist.

## 2023-05-02 NOTE — Telephone Encounter (Signed)
Contacted pt regarding HST, LVM rq call back

## 2023-05-07 ENCOUNTER — Telehealth: Payer: Self-pay | Admitting: Neurology

## 2023-05-07 NOTE — Addendum Note (Signed)
Addended by: Geronimo Running A on: 05/07/2023 03:50 PM   Modules accepted: Orders

## 2023-05-07 NOTE — Telephone Encounter (Signed)
Patient returned my call. I discussed her sleep study results and recommendations. Patient reports that she cannot tolerate PAP therapy and would like to be referred to a sleep dentist for a dental device. She is aware that the dental device will likely only reduce her apnea slightly, perhaps to a moderate degree rather than a sever degree. Pt verbalized understanding of results. Pt had no questions at this time but was encouraged to call back if questions arise.

## 2023-05-07 NOTE — Telephone Encounter (Signed)
Referral sent to [facility] [phone]

## 2023-05-07 NOTE — Telephone Encounter (Signed)
Contacted pt again, LVM rq call back  

## 2023-05-30 NOTE — Progress Notes (Signed)
[facility] - Clinic Note  06/10/2023     CHIEF COMPLAINT Patient presents for Retina Follow Up    HISTORY OF PRESENT ILLNESS: Adriana [name] is a 77 y.o. female who presents to the clinic today for:   HPI     Retina Follow Up   Patient presents with  Diabetic Retinopathy.  In both eyes.  This started 1 year ago.  I, the attending physician,  performed the HPI with the patient and updated documentation appropriately.        Comments   Patient here for 1 year retina follow up for DM exam. Patient states vision doing good. No eye pain.       Last edited by Rennis Chris, MD on 06/10/2023  4:21 PM.    Pt states her last A1c was 8.4 in May   [name] [address] [place],  [place] [address]  HISTORICAL INFORMATION:   Selected notes from the MEDICAL RECORD NUMBER Referred by Ronney Lion, NP for DM exam LEE:  Ocular Hx- PMH-depression, HTN, high cholesterol, stroke, DM (taking metformin)    CURRENT MEDICATIONS: No current outpatient medications on file. (Ophthalmic Drugs)   No current facility-administered medications for this visit. (Ophthalmic Drugs)   Current Outpatient Medications (Other)  Medication Sig   ACCU-CHEK GUIDE test strip CHECK BLOOD SUGAR THREE TIMES DAILY   acetaminophen (TYLENOL) 500 MG tablet Take 500-1,000 mg by mouth every 6 (six) hours as needed (pain.).   buPROPion (WELLBUTRIN XL) 300 MG 24 hr tablet Take 300 mg by mouth in the morning.   chlorthalidone (HYGROTON) 25 MG tablet Take 25 mg by mouth in the morning.   Cholecalciferol (VITAMIN D) 2000 units CAPS Take 2,000 Units by mouth in the morning.   Continuous Blood Gluc Sensor (FREESTYLE LIBRE 14 DAY SENSOR) MISC 1 each by Does not apply route every 14 (fourteen) days. Change every 2 weeks   Cyanocobalamin (RA VITAMIN B12) 2000 MCG TBCR Take 2,000 mcg by mouth every evening.   Insulin Glargine (BASAGLAR KWIKPEN) 100 UNIT/ML  Inject 24 Units into the skin at bedtime.   insulin lispro (HUMALOG) 100 UNIT/ML injection Inject 0.06 mLs (6 Units total) into the skin 3 (three) times daily before meals. (Patient taking differently: Inject 12 Units into the skin 3 (three) times daily before meals.)   isosorbide mononitrate (IMDUR) 30 MG 24 hr tablet Take 0.5 tablets (15 mg total) by mouth daily.   lisinopril (PRINIVIL,ZESTRIL) 5 MG tablet Take 5 mg by mouth in the morning.   metFORMIN (GLUCOPHAGE) 1000 MG tablet Take 1,000 mg by mouth 2 (two) times daily with a meal.   metoprolol tartrate (LOPRESSOR) 25 MG tablet Take 25 mg by mouth 2 (two) times daily.   nitroGLYCERIN (NITROSTAT) 0.4 MG SL tablet Place 1 tablet (0.4 mg total) under the tongue every 5 (five) minutes as needed for chest pain.   potassium citrate (UROCIT-K) 10 MEQ (1080 MG) SR tablet Take 10 mEq by mouth 2 (two) times daily.   promethazine (PHENERGAN) 12.5 MG tablet Take 12.5 mg by mouth every evening.   rosuvastatin (CRESTOR) 40 MG tablet Take 1 tablet (40 mg total) by mouth every evening.   warfarin (COUMADIN) 5 MG tablet Take 5 mg by mouth every evening.   white petrolatum (VASELINE) GEL Apply 1 application topically daily as needed (for psoriasis).   ciclopirox (PENLAC) 8 % solution Apply topically at bedtime. Apply over nail and surrounding skin. Apply daily over  previous coat. After seven (7) days, may remove with alcohol and continue cycle. (Patient not taking: Reported on 04/26/2023)   No current facility-administered medications for this visit. (Other)   REVIEW OF SYSTEMS: ROS   Positive for: Endocrine, Cardiovascular, Eyes, Psychiatric Negative for: Constitutional, Gastrointestinal, Neurological, Skin, Genitourinary, Musculoskeletal, HENT, Respiratory, Allergic/Imm, Heme/Lymph Last edited by Laddie Aquas, COA on 06/10/2023  1:33 PM.     ALLERGIES Allergies  Allergen Reactions   Aspirin Nausea And Vomiting   Propofol Nausea And Vomiting     Confused BP goes up and down.   Boniva [Ibandronic Acid] Other (See Comments)    Unknown-possible nausea   Farxiga [Dapagliflozin] Other (See Comments)    "causes UTIs"     Sulfa Antibiotics Nausea Only   Trulicity [Dulaglutide] Other (See Comments)    unknown   PAST MEDICAL HISTORY Past Medical History:  Diagnosis Date   AKI (acute kidney injury) (HCC) 05/02/2018   Carotid artery disease (HCC)    hx of bilat CEA // Carotid US 12/19: R 40-59; L 1-39   Cellulitis of right leg 05/01/2018   Coronary artery disease    Myoview 10/19 High Risk // LHC 11/19 w/ 3 v CAD // Echo 11/19: EF 50-55, gr 1 DD, ant-sept, inf-sept, apical HK // s/p CABG in 11/2018   Depression    denies   DVT (deep venous thrombosis) (HCC)    in setting of Factor V Leiden mutation   Essential hypertension    Factor V Leiden mutation (HCC)    Chronic Warfarin managed by PCP // hx of recurrent DVT; prior CVA   GERD (gastroesophageal reflux disease)    History of kidney stones    History of sepsis 05/01/2018   History of stroke 2004   right side weakness-resolved   HLD (hyperlipidemia)    Myocardial infarction (HCC)    Pneumonia    x2   PONV (postoperative nausea and vomiting)    blood pressure up/down, confusion   Poorly controlled type 2 diabetes mellitus (HCC)    Seizures (HCC)    Sleep apnea    Past Surgical History:  Procedure Laterality Date   ABDOMINAL HYSTERECTOMY  1989   BACK SURGERY     CARDIAC CATHETERIZATION     Carotid artery endarterectomy     right and left side with stents   CATARACT EXTRACTION Bilateral 2017   CHOLECYSTECTOMY     CORONARY ARTERY BYPASS GRAFT N/A 11/17/2018   Procedure: CORONARY ARTERY BYPASS GRAFTING (CABG), ON PUMP, TIMES FIVE, USING LEFT INTERNAL MAMMARY ARTERY AND ENDOSCOPICALLY HARVESTED LEFT GREATER SAPHENOUS VEIN;  Surgeon: Alleen Borne, MD;  Location: [facility] OR;  Service: Open Heart Surgery;  Laterality: N/A;   LEFT HEART CATH AND CORONARY ANGIOGRAPHY N/A 11/04/2018    Procedure: LEFT HEART CATH AND CORONARY ANGIOGRAPHY;  Surgeon: [name], MD;  Location[facility] INVASIVE CV LAB;  Service: Cardiovascular;  Laterality: N/A;   LEFT HEART CATH AND CORS/GRAFTS ANGIOGRAPHY N/A 04/30/2023   Procedure: LEFT HEART CATH AND CORS/GRAFTS ANGIOGRAPHY;  Surgeon: [name], MD;  Location: [facility]VAS[facility] CV LAB;  Service: Cardiovascular;  Laterality: N/A;   LITHOTRIPSY     x3   TEE WITHOUT CARDIOVERSION N/A 11/17/2018   Procedure: TRANSESOPHAGEAL ECHOCARDIOGRAM (TEE);  Surgeon: Alleen Borne, MD;  Location[facility] OR;  Service: Open Heart Surgery;  Laterality: N/A;   TONSILLECTOMY     trigger fingers left and right Bilateral    TUBAL LIGATION     FAMILY HISTORY Family History  Problem Relation Age of Onset   Stroke Mother    Lung cancer Father    COPD Brother    Diabetes Brother    Cataracts Maternal Grandmother    Diabetes Maternal Grandmother    Amblyopia Neg Hx    Blindness Neg Hx    Glaucoma Neg Hx    Hypertension Neg Hx    Macular degeneration Neg Hx    Retinal detachment Neg Hx    Strabismus Neg Hx    Retinitis pigmentosa Neg Hx    Colon cancer Neg Hx    Esophageal cancer Neg Hx    Pancreatic cancer Neg Hx    Stomach cancer Neg Hx    SOCIAL HISTORY Social History   Tobacco Use   Smoking status: Former    Packs/day: 1.00    Years: 38.00    Additional pack years: 0.00    Total pack years: 38.00    Types: Cigarettes    Quit date: 2004    Years since quitting: 20.4   Smokeless tobacco: Never  Vaping Use   Vaping Use: Never used  Substance Use Topics   Alcohol use: Never   Drug use: Never       OPHTHALMIC EXAM:  Base Eye Exam     Visual Acuity (Snellen - Linear)       Right Left   Dist [place] 20/25 20/20   Dist ph [place] 20/20          Tonometry (Tonopen, 1:32 PM)       Right Left   Pressure 19 22         Pupils       Dark Light Shape React APD   Right 3 2 Round Brisk None   Left 3 2 Round Brisk None         Visual  Fields (Counting fingers)       Left Right    Full Full         Extraocular Movement       Right Left    Full, Ortho Full, Ortho         Neuro/Psych     Oriented x3: Yes   Mood/Affect: Normal         Dilation     Both eyes: 1.0% Mydriacyl, 2.5% Phenylephrine @ 1:32 PM           Slit Lamp and Fundus Exam     Slit Lamp Exam       Right Left   Lids/Lashes Dermatochalasis - upper lid, mild MGD Dermatochalasis - upper lid, mild MGD   Conjunctiva/Sclera White and quiet White and quiet   Cornea Well healed cataract wounds, sub epi scar at 1200, Arcus, trace PEE Well healed cataract wounds, Arcus, mild tear film debris, focal corneal scar / haze at 0730   Anterior Chamber deep and clear Deep and quiet   Iris Round and dilated, No NVI Round and dilated, No NVI   Lens Tecnis multifocal PC IOL in good position, open PC Tecnis multifocal PC IOL in good position, open PC   Anterior Vitreous Vitreous syneresis, Posterior vitreous detachment mild syneresis         Fundus Exam       Right Left   Disc Pink and Sharp mild pallor, Sharp rim   C/D Ratio 0.5 0.4   Macula Blunted foveal reflex, retinal drusen, Retinal pigment epithelial mottling and clumping, No heme or edema Blunted foveal reflex, Epiretinal membrane, drusen, Retinal pigment epithelial mottling  and clumping, no heme or edema   Vessels attenuated, mild tortuosity attenuated, mild tortuosity   Periphery Attached, inferior paving stone, chorioretinal atrophy at 1030, +reticular degeneration, no heme, mild peripheral drusen Attached, scattered paving stone / chorioretinal atrophy, +reticular degeneration, no heme, mild peripheral drusen           IMAGING AND PROCEDURES  Imaging and Procedures for @TODAY @  OCT, Retina - OU - Both Eyes       Right Eye Quality was good. Central Foveal Thickness: 255. Progression has been stable. Findings include normal foveal contour, no IRF, no SRF, retinal drusen .    Left Eye Quality was good. Central Foveal Thickness: 272. Progression has been stable. Findings include normal foveal contour, no IRF, no SRF, retinal drusen , epiretinal membrane, macular pucker (Trace ERM and pucker -- stable).   Notes *Images captured and stored on drive  Diagnosis / Impression:  OD: NFP, No IRF/SRF OS: trace ERM and pucker -- stable No DME OU  Clinical management:  See below  Abbreviations: NFP - Normal foveal profile. CME - cystoid macular edema. PED - pigment epithelial detachment. IRF - intraretinal fluid. SRF - subretinal fluid. EZ - ellipsoid zone. ERM - epiretinal membrane. ORA - outer retinal atrophy. ORT - outer retinal tubulation. SRHM - subretinal hyper-reflective material             ASSESSMENT/PLAN:    ICD-10-CM   1. Diabetes mellitus type 2 without retinopathy (HCC)  E11.9 OCT, Retina - OU - Both Eyes    2. Current use of insulin (HCC)  Z79.4     3. Long term (current) use of oral hypoglycemic drugs  Z79.84     4. Epiretinal membrane (ERM) of left eye  H35.372     5. Early dry stage nonexudative age-related macular degeneration of both eyes  H35.3131     6. Pseudophakia of both eyes  Z96.1      1-3. Diabetes mellitus, type 2 without retinopathy  - pt self reports A1c of  ~8.4 in May 2024 -- pt using Freestyle Libre 2 now - The incidence, risk factors for progression, natural history and treatment options for diabetic retinopathy  were discussed with patient.   - The need for close monitoring of blood glucose, blood pressure, and serum lipids, avoiding cigarette or any type of tobacco, and the need for long term follow up was also discussed with patient. - OCT w/o DME OU - f/u in 1 year, sooner prn  4. Epiretinal membrane OS -- stable - BCVA  20/25 OS -- stable - OCT shows mild ERM and pucker -- stable - not visually significant -- no surgical intervention indicated at this time - cont monitoring - F/U 1 yr, sooner prn -- DFE,  OCT  5. Age related macular degeneration, non-exudative, both eyes  - The incidence, anatomy, and pathology of dry AMD, risk of progression, and the AREDS and AREDS 2 study including smoking risks discussed with patient.  - Recommend amsler grid monitoring  - f/u 1 yr  6. Pseudophakia OU  - s/p CE/IOL OU -- Tecnis multifocal IOLs by Dr. Gershon Cull  - beautiful surgeries, doing well  - monitor  Ophthalmic Meds Ordered this visit:  No orders of the defined types were placed in this encounter.    Return in about 1 year (around 06/09/2024) for f/u DM exam, DFE, OCT.  There are no Patient Instructions on file for this visit.  This document serves as a record of  services personally performed by Karie Chimera, MD, PhD. It was created on their behalf by De Blanch, an ophthalmic technician. The creation of this record is the provider's dictation and/or activities during the visit.    Electronically signed by: De Blanch, OA, 06/10/23  4:21 PM  This document serves as a record of services personally performed by Karie Chimera, MD, PhD. It was created on their behalf by Glee Arvin. Manson Passey, OA an ophthalmic technician. The creation of this record is the provider's dictation and/or activities during the visit.    Electronically signed by: Glee Arvin. Manson Passey, [place] 06.24.2024 4:21 PM  Karie Chimera, M.D., Ph.D. Diseases & Surgery of the Retina and Vitreous [facility]  I have reviewed the above documentation for accuracy and completeness, and I agree with the above. Karie Chimera, M.D., Ph.D. 06/10/23 4:23 PM   Abbreviations: M myopia (nearsighted); A astigmatism; H hyperopia (farsighted); P presbyopia; Mrx spectacle prescription;  CTL contact lenses; OD right eye; OS left eye; OU both eyes  XT exotropia; ET esotropia; PEK punctate epithelial keratitis; PEE punctate epithelial erosions; DES dry eye syndrome; MGD meibomian gland dysfunction; ATs artificial  tears; PFAT's preservative free artificial tears; NSC nuclear sclerotic cataract; PSC posterior subcapsular cataract; ERM epi-retinal membrane; PVD posterior vitreous detachment; RD retinal detachment; DM diabetes mellitus; DR diabetic retinopathy; NPDR non-proliferative diabetic retinopathy; PDR proliferative diabetic retinopathy; CSME clinically significant macular edema; DME diabetic macular edema; dbh dot blot hemorrhages; CWS cotton wool spot; POAG primary open angle glaucoma; C/D cup-to-disc ratio; HVF humphrey visual field; GVF goldmann visual field; OCT optical coherence tomography; IOP intraocular pressure; BRVO Branch retinal vein occlusion; CRVO central retinal vein occlusion; CRAO central retinal artery occlusion; BRAO branch retinal artery occlusion; RT retinal tear; SB scleral buckle; PPV pars plana vitrectomy; VH Vitreous hemorrhage; PRP panretinal laser photocoagulation; IVK intravitreal kenalog; VMT vitreomacular traction; MH Macular hole;  NVD neovascularization of the disc; NVE neovascularization elsewhere; AREDS age related eye disease study; ARMD age related macular degeneration; POAG primary open angle glaucoma; EBMD epithelial/anterior basement membrane dystrophy; ACIOL anterior chamber intraocular lens; IOL intraocular lens; PCIOL posterior chamber intraocular lens; Phaco/IOL phacoemulsification with intraocular lens placement; PRK photorefractive keratectomy; LASIK laser assisted in situ keratomileusis; HTN hypertension; DM diabetes mellitus; COPD chronic obstructive pulmonary disease

## 2023-06-10 ENCOUNTER — Encounter (INDEPENDENT_AMBULATORY_CARE_PROVIDER_SITE_OTHER): Payer: Self-pay | Admitting: Ophthalmology

## 2023-06-10 ENCOUNTER — Ambulatory Visit (INDEPENDENT_AMBULATORY_CARE_PROVIDER_SITE_OTHER): Payer: Federal, State, Local not specified - PPO | Admitting: Ophthalmology

## 2023-06-10 DIAGNOSIS — Z961 Presence of intraocular lens: Secondary | ICD-10-CM

## 2023-06-10 DIAGNOSIS — Z7984 Long term (current) use of oral hypoglycemic drugs: Secondary | ICD-10-CM

## 2023-06-10 DIAGNOSIS — E119 Type 2 diabetes mellitus without complications: Secondary | ICD-10-CM | POA: Diagnosis not present

## 2023-06-10 DIAGNOSIS — H353131 Nonexudative age-related macular degeneration, bilateral, early dry stage: Secondary | ICD-10-CM

## 2023-06-10 DIAGNOSIS — H35372 Puckering of macula, left eye: Secondary | ICD-10-CM

## 2023-06-10 DIAGNOSIS — Z794 Long term (current) use of insulin: Secondary | ICD-10-CM

## 2023-06-13 ENCOUNTER — Ambulatory Visit: Payer: Federal, State, Local not specified - PPO | Admitting: Gastroenterology

## 2023-06-13 VITALS — BP 138/68 | HR 73 | Ht 66.0 in | Wt 142.0 lb

## 2023-06-13 DIAGNOSIS — R197 Diarrhea, unspecified: Secondary | ICD-10-CM

## 2023-06-13 NOTE — Progress Notes (Signed)
Subjective:      Subjective [] Expand by Default Patient ID: Adriana [name], female    DOB: 1946-08-23, 77 y.o.   MRN: [id number]   HPI  Adriana [name] is a pleasant 77 year old white female, established with Dr. Orvan Falconer, who is referred back by Dr. Rinaldo Cloud office regarding abnormal CT of the abdomen and pelvis done on 02/15/2023. She had last been seen here in November 2023 per Dr. Orvan Falconer.  She has had ongoing fairly chronic issues with diarrhea which she says has persisted and has been present for years at this point She has in the past been given a trial of Xifaxan for suspected SIBO in the setting of a known duodenal diverticulum.  Patient says that did not seem to make any difference.  She has tried over-the-counter Imodium etc. in the past without any benefit.  She has been on dicyclomine 10 mg 4 times daily over the past many months and does not feel like that is helping her symptoms either.  Decision has been made not to pursue further endoscopic evaluation given multiple comorbidities and need for chronic anticoagulation. Patient has history of CVA, coronary artery disease status post CABG x 5, atrial fibrillation, factor V Leyden mutation, adult onset diabetes mellitus, chronic GERD, peripheral vascular disease, history of carotid stenosis, chronic kidney disease stage III.  She is on chronic anticoagulation with Coumadin. Also note patient takes metformin 1000 mg twice daily. She underwent a CT of the abdomen and pelvis without IV contrast on 02/15/2023 due to concerns for possible diverticulitis, complaints of abdominal pain and blood in stool.  She is noticed to have fatty liver infiltration, diffuse diverticulosis greatest in the sigmoid region but no evidence of diverticulitis.  There is extensive calcified plaque greatest along the extreme distal abdominal aorta and iliac arteries, and scattered tiny nodes in the mesentery many with components of calcification which are unchanged from  the prior exam.  Report reads there is suggestion of significant stenosis along the extreme distal abdominal aorta on the prior the patent lumen narrowed to a diameter approaching 4 mm, iliac stenosis also seen, preserved IVC, no discrete abnormal lymph node enlargement.   Patient reports that around the time that she had the CT scan her husband had been diagnosed with the flu and she had been given a preventative antiviral which she says she only took for a couple of days and then noticed that her diarrhea was significantly worse so she stopped it.  She says overall she has had loose stools for at least a year but acutely after she had taken the antiviral she had up to 12 bowel movements per day of loose to liquid stool nonbloody.  But did notice some blood in the commode on 2 occasions during that episode.  She has not seen any further blood in the stool over the past week, she says that her abdomen generally is uncomfortable in the mid abdomen she vomits easily which is chronic for her and usually stays nauseated.  Her usual pattern is to have diarrhea within an hour of eating and then frequently has urgency after that with repeated trips back to the bathroom sometimes not passing any further stool.  Currently not having any nocturnal episodes of diarrhea.  She does complain of cramping in her abdomen after eating most meals as well. Interestingly today she says she had an almost normal bowel movement for the first time in many months. I do not have copies of her most recent labs  from Dr. Rinaldo Cloud office but she said that she had labs done about 10 days ago that had showed that her creatinine was mildly elevated and then this was repeated again last Friday and renal function was much improved.   Her weight overall has been stable.   Review of Systems Pertinent positive and negative review of systems were noted in the above HPI section.  All other review of systems was otherwise negative.   Diarrhea  3/day Neg stool studies 15 min Mild abdo pain. Last colon (fatyellve) few yrs.   Wt Readings from Last 3 Encounters:  06/13/23 142 lb (64.4 kg)  04/30/23 137 lb (62.1 kg)  04/17/23 139 lb 12.8 oz (63.4 kg)     Past GI [facility]  CT AP   Cardiac Cath 04/30/2023 1.  Patent but calcified left main, divides into the LAD and circumflex 2.  Total occlusion of the proximal LAD 3.  Severe diffuse stenosis of the first OM branch of the circumflex 4.  Chronic occlusion of the proximal RCA, collateralized from the left coronary artery 5.  Widely patent LIMA to LAD graft with no stenosis 6.  Widely patent sequential saphenous vein graft to OM1 and OM 2 with no stenosis 7.  Total occlusion of the SVG to diagonal and SVG to distal RCA 8.  Mildly elevated LVEDP of 19 mmHg   Recommendations: No targets for PCI.  The right coronary artery is occluded and collateralized.  There is patency of bypass grafts to both the LAD and circumflex.  Continue medical therapy.  Resume warfarin today.       Outpatient Encounter Medications as of 02/25/2023  Medication Sig   ACCU-CHEK GUIDE test strip CHECK BLOOD SUGAR THREE TIMES DAILY   acetaminophen (TYLENOL) 500 MG tablet Take 500 mg by mouth every 6 (six) hours as needed.   buPROPion (WELLBUTRIN XL) 300 MG 24 hr tablet Take 300 mg by mouth daily.    chlorthalidone (HYGROTON) 25 MG tablet Take 25 mg by mouth daily.   Cholecalciferol (VITAMIN D) 2000 units CAPS Take 2,000 Units by mouth daily.    clobetasol cream (TEMOVATE) 0.05 % Apply 1 application topically daily as needed (for psoriasis).    colestipol (COLESTID) 1 g tablet Take 2 tablets (2 g total) by mouth 2 (two) times daily.   Continuous Blood Gluc Sensor (FREESTYLE LIBRE 14 DAY SENSOR) MISC 1 each by Does not apply route every 14 (fourteen) days. Change every 2 weeks   cyanocobalamin 2000 MCG tablet Take 2,000 mcg by mouth daily.   dicyclomine (BENTYL) 10 MG capsule Take 1 capsule (10 mg total) by mouth  4 (four) times daily -  before meals and at bedtime. (Patient not taking: Reported on 02/26/2023)   Insulin Glargine (BASAGLAR KWIKPEN) 100 UNIT/ML Inject 24 Units into the skin at bedtime.   insulin lispro (HUMALOG) 100 UNIT/ML injection Inject 0.06 mLs (6 Units total) into the skin 3 (three) times daily before meals. (Patient taking differently: Inject 12 Units into the skin 3 (three) times daily before meals.)   lisinopril (PRINIVIL,ZESTRIL) 5 MG tablet Take 5 mg by mouth daily.   metoprolol tartrate (LOPRESSOR) 25 MG tablet Take 25 mg by mouth 2 (two) times daily.   potassium citrate (UROCIT-K) 10 MEQ (1080 MG) SR tablet Take 10 mEq by mouth 2 (two) times daily.   PROLIA 60 MG/ML SOSY injection Inject into the skin.   promethazine (PHENERGAN) 25 MG tablet Take 12.5 mg by mouth every evening.  rosuvastatin (CRESTOR) 40 MG tablet TAKE 1 TABLET(40 MG) BY MOUTH DAILY   warfarin (COUMADIN) 5 MG tablet Take by mouth See admin instructions. Take 5 mg every Monday and Friday; 2.5 mg all other days of the week   white petrolatum (VASELINE) GEL Apply 1 application topically daily as needed (for psoriasis).   [DISCONTINUED] metFORMIN (GLUCOPHAGE) 1000 MG tablet Take 1,000 mg by mouth 2 (two) times daily with a meal.   rifaximin (XIFAXAN) 550 MG TABS tablet Take 1 tablet (550 mg total) by mouth 3 (three) times daily.   [DISCONTINUED] pantoprazole (PROTONIX) 40 MG tablet Take 1 tablet (40 mg total) by mouth daily before breakfast.   [DISCONTINUED] scopolamine (TRANSDERM-SCOP) 1 MG/3DAYS 1 patch every 3 (three) days.    No facility-administered encounter medications on file as of 02/25/2023.         Allergies  Allergen Reactions   Aspirin Nausea And Vomiting   Propofol Nausea And Vomiting      Confused BP goes up and down.   Propofol Nausea And Vomiting      Confused BP goes up and down.   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, with stage 1 through stage 4 chronic kidney disease, or unspecified chronic kidney disease 08/02/2022   Multinodular goiter 08/02/2022   Atrial fibrillation, transient (HCC) 09/15/2020   OSA (obstructive sleep apnea) 07/18/2020   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 07/18/2020   Chronic kidney disease, stage 3b (HCC) 12/28/2019   Hyperkalemia 05/22/2019   Anxiety disorder 04/17/2019   Hardening of the aorta (main artery of the heart) (HCC) 04/17/2019   Vitamin B deficiency 04/17/2019   CAD (coronary artery disease) 03/17/2019   Carotid artery disease (HCC)     Postoperative atrial fibrillation (HCC) 12/08/2018   Peripheral vascular disease (HCC) 12/02/2018   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   Abnormal nuclear cardiac imaging test 11/04/2018   Long term (current) use of insulin (HCC) 05/21/2018   AKI (acute kidney injury) (HCC) 05/02/2018   Sepsis (HCC) 05/01/2018   Cellulitis of right leg 05/01/2018   Abdominal pain 05/01/2018   Depression     DVT (deep venous thrombosis) (HCC)     Essential hypertension     Factor V Leiden mutation (HCC)     HLD (hyperlipidemia)     Poorly controlled type 2 diabetes mellitus (HCC)     Stroke (cerebrum) (HCC)     Age-related osteoporosis without current pathological fracture 03/28/2018   Gastro-esophageal reflux disease without esophagitis 03/28/2018   Long term (current) use of anticoagulants 03/28/2018   Occlusion and stenosis of bilateral carotid arteries 03/28/2018   Tobacco user 03/28/2018   Poorly controlled type 2 diabetes mellitus with circulatory disorder (HCC) 03/27/2018   Decreased urine volume 01/23/2016   Gout, unspecified  01/23/2016   Hypercalciuria 01/23/2016   Hyperuricosuria 01/23/2016   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HCC) 01/23/2016   Arthritis 06/22/2014   Recurrent nephrolithiasis 05/31/2012   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    Social History         Socioeconomic  History   Marital status: Married      Spouse name: [name]   Number of children: Not on file   Years of education: 12   Highest education level: High school graduate  Occupational History   Occupation: Retired  Tobacco Use   Smoking status: Former      Packs/day: 1.00      Years: 38.00      Total pack years: 38.00      Types: Cigarettes      Quit date: 2004      Years since quitting: 20.2   Smokeless tobacco: Never  Vaping Use   Vaping Use: Never used  Substance and Sexual Activity   Alcohol use: Never   Drug use: Never   Sexual activity: Not Currently  Other Topics Concern   Not on file  Social History Narrative   Not on file    Social Determinants of Health        Financial Resource Strain: Low Risk  (02/05/2019)    Overall Financial Resource Strain (CARDIA)     Difficulty of Paying Living Expenses: Not hard at all  Food Insecurity: No Food Insecurity (02/05/2019)    Hunger Vital Sign     Worried About Running Out of Food in the Last Year: Never true     Ran Out of Food in the Last Year: Never true  Transportation Needs: No Transportation Needs (02/05/2019)    PRAPARE - Therapist, art (Medical): No     Lack of Transportation (Non-Medical): No  Physical Activity: Inactive (02/05/2019)    Exercise Vital Sign     Days of Exercise per Week: 0 days     Minutes of Exercise per Session: 0 min  Stress: No Stress Concern Present (02/05/2019)    [employer] of Occupational Health - Occupational Stress Questionnaire     Feeling of Stress : Not at all  Social Connections: Not on file  Intimate Partner Violence: Not on file      Ms. Vuolo's family history includes COPD in her brother;  Cataracts in her maternal grandmother; Diabetes in her brother and maternal grandmother; Lung cancer in her father; Stroke in her mother.         Objective:    Objective     Vitals:    02/25/23 1505  BP: 108/64  Pulse: 74  SpO2: 96%      Physical Exam Well-developed well-nourished eld WF  in no acute distress.  Height, ZOXWRU,045 BMI 23.18   HEENT; nontraumatic normocephalic, EOMI, PE R LA, sclera anicteric. Oropharynx;not examined Neck; supple, no JVD Cardiovascular; regular rate and rhythm with S1-S2, no murmur rub or gallop ,sternal incisional scar, Pulmonary; Clear bilaterally Abdomen; soft,  nondistended, there is some tenderness in the epigastrium and hypogastrium no guarding or rebound, no audible bruit no palpable mass or hepatosplenomegaly, bowel sounds are active Rectal;not done today  Skin; benign exam, no jaundice rash or appreciable lesions Extremities; no clubbing cyanosis or edema skin warm and dry Neuro/Psych; alert and oriented x4, grossly nonfocal mood and affect appropriate            Assessment & Plan:    #87 77 year old white female with chronic diarrhea present at least over the past year, usually 4-5 bowel movements per day, most bowel movements occur within an hour of eating, some urgency and cramping/pain associated. No response to a course of Xifaxan for SIBO, no response to over-the-counter antidiarrheals, no response to dicyclomine regular  dosing 4 times daily. Patient had a very recent episode of significant worsening of diarrhea and had a couple of bouts of diarrhea with bright red blood noted in the commode about 2 weeks ago when she tried taking an antiviral for influenza prevention.  She stopped that medication and symptoms are back to baseline at present. CT of the abdomen pelvis without IV contrast on 02/15/2023 shows scattered diverticulosis and suggestion of significant stenosis along the distal aorta (and when compared with most recent CT with  IV contrast felt to narrow down to about 4 mm and also with significant iliac stenosis. No lymphadenopathy but some scattered tiny nodules in the mesentery which are stable under 5 mm and felt to be most likely postinfectious. Negative stool studies including stool for calprotectin, fecal elastase.  Negative celiac screen   CT scan raises question of potential mesenteric insufficiency chronic mesenteric ischemia due to account for patient's postprandial abdominal pain cramping and diarrhea.     #2 factor V Leyden mutation, on chronic Coumadin #3 coronary artery disease status post CABG x 5 #4.  Atrial fibrillation #5.  Chronic kidney disease stage III #6.  History of carotid stenosis and peripheral vascular disease #7.  Sleep apnea #8.  Adult onset diabetes mellitus #9.  Prior CVA #10.  Prior DVT     Attending physician's note   I have taken history, reviewed the chart and examined the patient. I performed a substantive portion of this encounter, including complete performance of at least one of the key components, in conjunction with the APP. I agree with the Advanced Practitioner's note, impression and recommendations.    Diarrhea-likely d/t metformin.    Plan:  -Stop dicyclomine and colestipol. -Adv to stop metformin x 2 weeks (after she sees Dr Evlyn Kanner).  -Please make sure that she is not on any magnesium supplements -If still with problems, trial of cholestryamine followed by consultation with [facility], she has seen Dr. Lavinia Sharps in  past.  She may need abdominal mesenteric  arteriogram -If still with problems, consider FS/colon with Bx off Coumadin. She wants to avoid colonoscopy.  I do agree   Edman Circle, MD [facility] [phone]

## 2023-06-13 NOTE — Patient Instructions (Signed)
_______________________________________________________  If your blood pressure at your visit was 140/90 or greater, please contact your primary care physician to follow up on this.  _______________________________________________________  If you are age 75 or older, your body mass index should be between 23-30. Your Body mass index is 22.92 kg/m. If this is out of the aforementioned range listed, please consider follow up with your Primary Care Provider.  If you are age 78 or younger, your body mass index should be between 19-25. Your Body mass index is 22.92 kg/m. If this is out of the aformentioned range listed, please consider follow up with your Primary Care Provider.   ________________________________________________________  The [facility] providers would like to encourage you to use [facility] to communicate with providers for non-urgent requests or questions.  Due to long hold times on the telephone, sending your provider a message by [facility] may be a faster and more efficient way to get a response.  Please allow 48 business hours for a response.  Please remember that this is for non-urgent requests.  _______________________________________________________  Please contact your PCP   Thank you,  Dr. Lynann Bologna

## 2023-06-19 ENCOUNTER — Ambulatory Visit: Payer: Federal, State, Local not specified - PPO | Admitting: Podiatry

## 2023-06-19 DIAGNOSIS — B351 Tinea unguium: Secondary | ICD-10-CM

## 2023-06-19 DIAGNOSIS — M79674 Pain in right toe(s): Secondary | ICD-10-CM | POA: Diagnosis not present

## 2023-06-19 DIAGNOSIS — M79675 Pain in left toe(s): Secondary | ICD-10-CM | POA: Diagnosis not present

## 2023-06-19 MED ORDER — AMMONIUM LACTATE 12 % EX LOTN: 1.0000 | TOPICAL_LOTION | CUTANEOUS | 0 refills | Status: AC | PRN

## 2023-06-19 NOTE — Progress Notes (Signed)
  Subjective:  Patient ID: Adriana [name], female    DOB: 1946-04-18,  MRN: [id number]  Chief Complaint  Patient presents with   Diabetes    Diabetic foot care   77 y.o. female returns for the above complaint.  Patient presents with thickened and again dystrophic toenails x 10 mild pain on palpation hurts with ambulation worse with pressure would like to have them to be done she is not able to do it herself.  Denies any other acute complaints at this  Objective:  There were no vitals filed for this visit. Podiatric Exam: Vascular: dorsalis pedis and posterior tibial pulses are palpable bilateral. Capillary return is immediate. Temperature gradient is WNL. Skin turgor WNL  Sensorium: Normal Semmes Weinstein monofilament test. Normal tactile sensation bilaterally. Nail Exam: Pt has thick disfigured discolored nails with subungual debris noted bilateral entire nail hallux through fifth toenails.  Pain on palpation to the nails. Ulcer Exam: There is no evidence of ulcer or pre-ulcerative changes or infection. Orthopedic Exam: Muscle tone and strength are WNL. No limitations in general ROM. No crepitus or effusions noted.  Skin: No Porokeratosis. No infection or ulcers    Assessment & Plan:   1. Pain due to onychomycosis of toenails of both feet     Patient was evaluated and treated and all questions answered.  Onychomycosis with pain  -Nails palliatively debrided as below. -Educated on self-care  Procedure: Nail Debridement Rationale: pain  Type of Debridement: manual, sharp debridement. Instrumentation: Nail nipper, rotary burr. Number of Nails: 10  Procedures and Treatment: Consent by patient was obtained for treatment procedures. The patient understood the discussion of treatment and procedures well. All questions were answered thoroughly reviewed. Debridement of mycotic and hypertrophic toenails, 1 through 5 bilateral and clearing of subungual debris. No ulceration, no  infection noted.  Return Visit-Office Procedure: Patient instructed to return to the office for a follow up visit 3 months for continued evaluation and treatment.  Nicholes Rough, DPM    No follow-ups on file.

## 2023-09-11 LAB — LAB REPORT - SCANNED: EGFR (Non-African Amer.): 54.1

## 2023-09-25 ENCOUNTER — Encounter: Payer: Self-pay | Admitting: Podiatry

## 2023-09-25 ENCOUNTER — Ambulatory Visit (INDEPENDENT_AMBULATORY_CARE_PROVIDER_SITE_OTHER): Payer: Federal, State, Local not specified - PPO | Admitting: Podiatry

## 2023-09-25 VITALS — BP 114/74 | HR 54

## 2023-09-25 DIAGNOSIS — B351 Tinea unguium: Secondary | ICD-10-CM | POA: Diagnosis not present

## 2023-09-25 DIAGNOSIS — M79675 Pain in left toe(s): Secondary | ICD-10-CM | POA: Diagnosis not present

## 2023-09-25 DIAGNOSIS — M79674 Pain in right toe(s): Secondary | ICD-10-CM | POA: Diagnosis not present

## 2023-09-25 NOTE — Progress Notes (Signed)
Subjective:  Patient ID: Adriana [name], female    DOB: 11/22/1946,  MRN: [id number]  Chief Complaint  Patient presents with   Diabetes    PATIENT NEEDS NAILS CUT    77 y.o. female returns for the above complaint.  Patient presents with thickened and again dystrophic toenails x 10 mild pain on palpation hurts with ambulation worse with pressure would like to have them to be done she is not able to do it herself.  Denies any other acute complaints at this  Objective:   Vitals:   09/25/23 1548  BP: 114/74  Pulse: (!) 54   Podiatric Exam: Vascular: dorsalis pedis and posterior tibial pulses are palpable bilateral. Capillary return is immediate. Temperature gradient is WNL. Skin turgor WNL  Sensorium: Normal Semmes Weinstein monofilament test. Normal tactile sensation bilaterally. Nail Exam: Pt has thick disfigured discolored nails with subungual debris noted bilateral entire nail hallux through fifth toenails.  Pain on palpation to the nails. Ulcer Exam: There is no evidence of ulcer or pre-ulcerative changes or infection. Orthopedic Exam: Muscle tone and strength are WNL. No limitations in general ROM. No crepitus or effusions noted.  Skin: No Porokeratosis. No infection or ulcers    Assessment & Plan:   1. Pain due to onychomycosis of toenails of both feet      Patient was evaluated and treated and all questions answered.  Onychomycosis with pain  -Nails palliatively debrided as below. -Educated on self-care  Procedure: Nail Debridement Rationale: pain  Type of Debridement: manual, sharp debridement. Instrumentation: Nail nipper, rotary burr. Number of Nails: 10  Procedures and Treatment: Consent by patient was obtained for treatment procedures. The patient understood the discussion of treatment and procedures well. All questions were answered thoroughly reviewed. Debridement of mycotic and hypertrophic toenails, 1 through 5 bilateral and clearing of subungual  debris. No ulceration, no infection noted.  Return Visit-Office Procedure: Patient instructed to return to the office for a follow up visit 3 months for continued evaluation and treatment.  Nicholes Rough, DPM    No follow-ups on file.

## 2023-11-01 ENCOUNTER — Ambulatory Visit: Payer: Federal, State, Local not specified - PPO | Admitting: Physician Assistant

## 2023-11-04 ENCOUNTER — Ambulatory Visit: Payer: Federal, State, Local not specified - PPO | Attending: Physician Assistant | Admitting: Physician Assistant

## 2023-11-04 ENCOUNTER — Encounter: Payer: Self-pay | Admitting: Physician Assistant

## 2023-11-04 VITALS — BP 120/53 | HR 78 | Ht 66.0 in | Wt 151.2 lb

## 2023-11-04 DIAGNOSIS — I6523 Occlusion and stenosis of bilateral carotid arteries: Secondary | ICD-10-CM

## 2023-11-04 DIAGNOSIS — I10 Essential (primary) hypertension: Secondary | ICD-10-CM | POA: Diagnosis not present

## 2023-11-04 DIAGNOSIS — I2510 Atherosclerotic heart disease of native coronary artery without angina pectoris: Secondary | ICD-10-CM | POA: Diagnosis not present

## 2023-11-04 DIAGNOSIS — E7800 Pure hypercholesterolemia, unspecified: Secondary | ICD-10-CM

## 2023-11-04 DIAGNOSIS — D6851 Activated protein C resistance: Secondary | ICD-10-CM

## 2023-11-04 NOTE — Progress Notes (Signed)
Cardiology Office Note:    Date:  11/04/2023  ID:  Hazley, Coglianese 02-Aug-1946, MRN [id number] PCP: [name], MD  [facility] Providers Cardiologist:  [name], MD       Patient Profile:      Coronary artery disease S/p CABG in 11/2018 TTE 10/29/2018: Moderate concentric LVH, EF 50-55, GR 1 DD, anteroseptal, inferoseptal, inferior, apical septal and apical HK, trivial MR, normal RVSF, possible small fixed mass right side of interatrial septum, trivial TR Myoview 03/22/2023: Anterior apical infarct, mild peri-infarct ischemia, low risk LHC 04/30/2023: LM patent, LAD ostial 90, proximal 80/80, D1 ostial 80; OM1 75; RCA proximal 100; SVG-RCA 100, LIMA-LAD patent, SVG-OM1/OM 2 patent, SVG-D1 100>> med Rx Heterozygous factor V Leiden Recurrent VTE's Long-term anticoagulation with warfarin Carotid artery disease Status post bilateral CEA [place] 03/13/2023: Bilateral ICA 1-39 Diabetes mellitus Hyperlipidemia Sleep apnea History of seizures         History of Present Illness:  Discussed the use of AI scribe software for clinical note transcription with the patient, who gave verbal consent to proceed.  Adriana [name] is a 77 y.o. female who returns for follow up of CAD. She was last seen by Dr. Excell Seltzer in May 2024.  She was having chest burning symptoms similar to her previous angina.  Cardiac catheterization was arranged and demonstrated an occluded native RCA.  Vein graft to the diagonal and vein graft to the distal RCA were both occluded.  There was patent bypass grafts to the LAD and LCx.  There were no targets for PCI and medical therapy was continued. The patient reports feeling well overall, with no chest symptoms or shortness of breath. She does experience occasional heartburn, which she attributes to meals. She also reports experiencing lightheadedness two to three times a week, which she attributes to her medication regimen. This lightheadedness  does not seem to be related to standing up quickly, but rather occurs a few minutes after standing and moving around.  She has not had syncope. The patient also reports right ankle swelling if she is on her feet too much, which she attributes to arthritis.       Review of Systems  Respiratory:  Positive for hemoptysis (2-3 mos ago x 1; eval by PCP).   Gastrointestinal:  Negative for hematochezia and melena.  Genitourinary:  Negative for hematuria.  See HPI     Studies Reviewed:        Results   LABS LDL: 38 mg/dL (95/28/4132) Creatinine: 1.26 mg/dL (44/12/270) Hemoglobin: 13.7 g/dL (53/66/4403)       Risk Assessment/Calculations:             Physical Exam:   VS:  BP (!) 120/53   Pulse 78   Ht 5\' 6"  (1.676 m)   Wt 151 lb 3.2 oz (68.6 kg)   SpO2 96%   BMI 24.40 kg/m    Wt Readings from Last 3 Encounters:  11/04/23 151 lb 3.2 oz (68.6 kg)  06/13/23 142 lb (64.4 kg)  04/30/23 137 lb (62.1 kg)    Constitutional:      Appearance: Healthy appearance. Not in distress.  Neck:     Vascular: JVD normal.  Pulmonary:     Breath sounds: Normal breath sounds. No wheezing. No rales.  Cardiovascular:     Normal rate. Regular rhythm.     Murmurs: There is no murmur.  Edema:    Peripheral edema present.    Ankle: trace edema of the right  ankle. Abdominal:     Palpations: Abdomen is soft.        Assessment and Plan:   Assessment & Plan Coronary artery disease involving native coronary artery of native heart without angina pectoris S/p CABG in 2019. Cardiac catheterization in May 2024 showed occluded vein graft to RCA and patent grafts to LAD, OM-1, and OM-2. No new stents required. No chest discomfort or shortness of breath. Current occlusions not causing significant issues. Prefers current management. - Continue Imdur 15 mg daily - Continue metoprolol tartrate 25 mg twice daily - Continue NTG prn - Continue Crestor 40 mg daily - Continue Coumadin as monitored by primary  care - Follow up 1 year.  Essential hypertension Blood pressure well-controlled.  She reports dizziness likely due to orthostatic symptoms.  We discussed decreasing her dose of chlorthalidone.  However she prefers to hold off on decreasing chlorthalidone for now.  If her symptoms increase, we can certainly decrease chlorthalidone at that time. - Continue chlorthalidone 25 mg daily - Continue Imdur 15 mg once daily. - Continue lisinopril 5 mg daily - Continue metoprolol tartrate 25 mg twice daily Pure hypercholesterolemia LDL at 38 mg/dL as of 93/23/5573. Lipid levels well-controlled. - Continue Crestor 40 mg daily - Request most recent lipid panel from primary care Bilateral carotid artery stenosis Carotid ultrasound in March 2024 showed patent bilateral CEA sites. - Continue Crestor 40 mg daily Factor V Leiden mutation (HCC) Coumadin monitored by primary care.       Dispo:  Return in about 1 year (around 11/03/2024) for Routine Follow Up, w/ Dr. Excell Seltzer.  Signed, Tereso Newcomer, PA-C

## 2023-11-04 NOTE — Assessment & Plan Note (Signed)
Coumadin monitored by primary care.

## 2023-11-04 NOTE — Assessment & Plan Note (Signed)
Blood pressure well-controlled.  She reports dizziness likely due to orthostatic symptoms.  We discussed decreasing her dose of chlorthalidone.  However she prefers to hold off on decreasing chlorthalidone for now.  If her symptoms increase, we can certainly decrease chlorthalidone at that time. - Continue chlorthalidone 25 mg daily - Continue Imdur 15 mg once daily. - Continue lisinopril 5 mg daily - Continue metoprolol tartrate 25 mg twice daily

## 2023-11-04 NOTE — Assessment & Plan Note (Signed)
S/p CABG in 2019. Cardiac catheterization in May 2024 showed occluded vein graft to RCA and patent grafts to LAD, OM-1, and OM-2. No new stents required. No chest discomfort or shortness of breath. Current occlusions not causing significant issues. Prefers current management. - Continue Imdur 15 mg daily - Continue metoprolol tartrate 25 mg twice daily - Continue NTG prn - Continue Crestor 40 mg daily - Continue Coumadin as monitored by primary care - Follow up 1 year.

## 2023-11-04 NOTE — Patient Instructions (Signed)
Medication Instructions:  Your physician recommends that you continue on your current medications as directed. Please refer to the Current Medication list given to you today. *If you need a refill on your cardiac medications before your next appointment, please call your pharmacy*   Lab Work: We will get labs from your primary care provider   Testing/Procedures: None ordered   Follow-Up: At [facility], you and your health needs are our priority.  As part of our continuing mission to provide you with exceptional heart care, we have created designated Provider Care Teams.  These Care Teams include your primary Cardiologist (physician) and Advanced Practice Providers (APPs -  Physician Assistants and Nurse Practitioners) who all work together to provide you with the care you need, when you need it.  We recommend signing up for the patient portal called "MyChart".  Sign up information is provided on this After Visit Summary.  MyChart is used to connect with patients for Virtual Visits (Telemedicine).  Patients are able to view lab/test results, encounter notes, upcoming appointments, etc.  Non-urgent messages can be sent to your provider as well.   To learn more about what you can do with MyChart, go to [email or web address].    Your next appointment:   12 month(s)  Provider:   [name], MD     Other Instructions

## 2023-11-04 NOTE — Assessment & Plan Note (Signed)
LDL at 38 mg/dL as of 96/29/5284. Lipid levels well-controlled. - Continue Crestor 40 mg daily - Request most recent lipid panel from primary care

## 2023-11-04 NOTE — Assessment & Plan Note (Signed)
Carotid ultrasound in March 2024 showed patent bilateral CEA sites. - Continue Crestor 40 mg daily

## 2023-11-27 ENCOUNTER — Encounter: Payer: Self-pay | Admitting: Podiatry

## 2023-11-27 ENCOUNTER — Ambulatory Visit (INDEPENDENT_AMBULATORY_CARE_PROVIDER_SITE_OTHER): Payer: Federal, State, Local not specified - PPO | Admitting: Podiatry

## 2023-11-27 DIAGNOSIS — M79675 Pain in left toe(s): Secondary | ICD-10-CM

## 2023-11-27 DIAGNOSIS — B351 Tinea unguium: Secondary | ICD-10-CM | POA: Diagnosis not present

## 2023-11-27 DIAGNOSIS — M79674 Pain in right toe(s): Secondary | ICD-10-CM | POA: Diagnosis not present

## 2023-11-27 NOTE — Progress Notes (Signed)
  Subjective:  Patient ID: Adriana [name], female    DOB: Mar 14, 1946,  MRN: [id number]  Chief Complaint  Patient presents with   [facility]   77 y.o. female returns for the above complaint.  Patient presents with thickened and again dystrophic toenails x 10 mild pain on palpation hurts with ambulation worse with pressure would like to have them to be done she is not able to do it herself.  Denies any other acute complaints at this  Objective:   There were no vitals filed for this visit.  Podiatric Exam: Vascular: dorsalis pedis and posterior tibial pulses are palpable bilateral. Capillary return is immediate. Temperature gradient is WNL. Skin turgor WNL  Sensorium: Normal Semmes Weinstein monofilament test. Normal tactile sensation bilaterally. Nail Exam: Pt has thick disfigured discolored nails with subungual debris noted bilateral entire nail hallux through fifth toenails.  Pain on palpation to the nails. Ulcer Exam: There is no evidence of ulcer or pre-ulcerative changes or infection. Orthopedic Exam: Muscle tone and strength are WNL. No limitations in general ROM. No crepitus or effusions noted.  Skin: No Porokeratosis. No infection or ulcers    Assessment & Plan:   No diagnosis found.    Patient was evaluated and treated and all questions answered.  Onychomycosis with pain  -Nails palliatively debrided as below. -Educated on self-care  Procedure: Nail Debridement Rationale: pain  Type of Debridement: manual, sharp debridement. Instrumentation: Nail nipper, rotary burr. Number of Nails: 10  Procedures and Treatment: Consent by patient was obtained for treatment procedures. The patient understood the discussion of treatment and procedures well. All questions were answered thoroughly reviewed. Debridement of mycotic and hypertrophic toenails, 1 through 5 bilateral and clearing of subungual debris. No ulceration, no infection noted.  Return Visit-Office Procedure: Patient  instructed to return to the office for a follow up visit 3 months for continued evaluation and treatment.  Nicholes Rough, DPM    No follow-ups on file.

## 2024-02-12 ENCOUNTER — Other Ambulatory Visit: Payer: Self-pay | Admitting: Cardiovascular Disease

## 2024-02-12 DIAGNOSIS — I209 Angina pectoris, unspecified: Secondary | ICD-10-CM

## 2024-02-12 DIAGNOSIS — I779 Disorder of arteries and arterioles, unspecified: Secondary | ICD-10-CM

## 2024-02-12 DIAGNOSIS — I10 Essential (primary) hypertension: Secondary | ICD-10-CM

## 2024-02-12 DIAGNOSIS — E782 Mixed hyperlipidemia: Secondary | ICD-10-CM

## 2024-02-12 DIAGNOSIS — Z01810 Encounter for preprocedural cardiovascular examination: Secondary | ICD-10-CM

## 2024-02-26 ENCOUNTER — Ambulatory Visit (INDEPENDENT_AMBULATORY_CARE_PROVIDER_SITE_OTHER): Payer: Medicare Other | Admitting: Podiatry

## 2024-02-26 DIAGNOSIS — B351 Tinea unguium: Secondary | ICD-10-CM | POA: Diagnosis not present

## 2024-02-26 DIAGNOSIS — M79675 Pain in left toe(s): Secondary | ICD-10-CM | POA: Diagnosis not present

## 2024-02-26 DIAGNOSIS — M79674 Pain in right toe(s): Secondary | ICD-10-CM

## 2024-02-26 NOTE — Progress Notes (Signed)
  Subjective:  Patient ID: Adriana [name], female    DOB: 01/17/1946,  MRN: [id number]  Chief Complaint  Patient presents with   Diabetes    Nail trim   78 y.o. female returns for the above complaint.  Patient presents with thickened and again dystrophic toenails x 10 mild pain on palpation hurts with ambulation worse with pressure would like to have them to be done she is not able to do it herself.  Denies any other acute complaints at this  Objective:   There were no vitals filed for this visit.  Podiatric Exam: Vascular: dorsalis pedis and posterior tibial pulses are palpable bilateral. Capillary return is immediate. Temperature gradient is WNL. Skin turgor WNL  Sensorium: Normal Semmes Weinstein monofilament test. Normal tactile sensation bilaterally. Nail Exam: Pt has thick disfigured discolored nails with subungual debris noted bilateral entire nail hallux through fifth toenails.  Pain on palpation to the nails. Ulcer Exam: There is no evidence of ulcer or pre-ulcerative changes or infection. Orthopedic Exam: Muscle tone and strength are WNL. No limitations in general ROM. No crepitus or effusions noted.  Skin: No Porokeratosis. No infection or ulcers    Assessment & Plan:   No diagnosis found.    Patient was evaluated and treated and all questions answered.  Onychomycosis with pain  -Nails palliatively debrided as below. -Educated on self-care  Procedure: Nail Debridement Rationale: pain  Type of Debridement: manual, sharp debridement. Instrumentation: Nail nipper, rotary burr. Number of Nails: 10  Procedures and Treatment: Consent by patient was obtained for treatment procedures. The patient understood the discussion of treatment and procedures well. All questions were answered thoroughly reviewed. Debridement of mycotic and hypertrophic toenails, 1 through 5 bilateral and clearing of subungual debris. No ulceration, no infection noted.  Return Visit-Office  Procedure: Patient instructed to return to the office for a follow up visit 3 months for continued evaluation and treatment.  Nicholes Rough, DPM    No follow-ups on file.

## 2024-03-24 ENCOUNTER — Ambulatory Visit (HOSPITAL_COMMUNITY)
Admission: RE | Admit: 2024-03-24 | Discharge: 2024-03-24 | Disposition: A | Discharge status: Home / Self Care | Source: Ambulatory Visit | Attending: Cardiology | Admitting: Cardiology

## 2024-03-24 DIAGNOSIS — I6523 Occlusion and stenosis of bilateral carotid arteries: Secondary | ICD-10-CM | POA: Insufficient documentation

## 2024-03-27 ENCOUNTER — Telehealth: Payer: Self-pay

## 2024-03-27 DIAGNOSIS — I6523 Occlusion and stenosis of bilateral carotid arteries: Secondary | ICD-10-CM

## 2024-03-27 NOTE — Telephone Encounter (Signed)
Called and spoke with patient who verbalized understanding. Repeat order for next year placed at this time.

## 2024-03-27 NOTE — Telephone Encounter (Signed)
-----   Message from [name] sent at 03/27/2024 12:21 PM EDT ----- Patent carotid arteries bilaterally.  Recommend 1 year follow-up.

## 2024-04-15 ENCOUNTER — Other Ambulatory Visit: Payer: Self-pay | Admitting: Endocrinology

## 2024-04-15 DIAGNOSIS — E041 Nontoxic single thyroid nodule: Secondary | ICD-10-CM

## 2024-04-21 ENCOUNTER — Ambulatory Visit
Admission: RE | Admit: 2024-04-21 | Discharge: 2024-04-21 | Disposition: A | Discharge status: Home / Self Care | Source: Ambulatory Visit | Attending: Endocrinology | Admitting: Endocrinology

## 2024-04-21 DIAGNOSIS — E041 Nontoxic single thyroid nodule: Secondary | ICD-10-CM

## 2024-04-22 ENCOUNTER — Other Ambulatory Visit (HOSPITAL_COMMUNITY): Payer: Self-pay | Admitting: *Deleted

## 2024-04-23 ENCOUNTER — Ambulatory Visit (HOSPITAL_COMMUNITY)
Admission: RE | Admit: 2024-04-23 | Discharge: 2024-04-23 | Disposition: A | Discharge status: Home / Self Care | Source: Ambulatory Visit | Attending: Endocrinology | Admitting: Endocrinology

## 2024-04-23 DIAGNOSIS — M810 Age-related osteoporosis without current pathological fracture: Secondary | ICD-10-CM | POA: Diagnosis present

## 2024-04-23 MED ORDER — DENOSUMAB 60 MG/ML ~~LOC~~ SOSY
60.0000 mg | PREFILLED_SYRINGE | Freq: Once | SUBCUTANEOUS | Status: AC
  Administered 2024-04-23: 60 mg via SUBCUTANEOUS

## 2024-04-23 MED ORDER — DENOSUMAB 60 MG/ML ~~LOC~~ SOSY
PREFILLED_SYRINGE | SUBCUTANEOUS | Status: AC
  Filled 2024-04-23: qty 1

## 2024-05-04 ENCOUNTER — Other Ambulatory Visit: Payer: Self-pay | Admitting: Nephrology

## 2024-05-04 DIAGNOSIS — N1832 Chronic kidney disease, stage 3b: Secondary | ICD-10-CM

## 2024-05-12 ENCOUNTER — Ambulatory Visit
Admission: RE | Admit: 2024-05-12 | Discharge: 2024-05-12 | Disposition: A | Discharge status: Home / Self Care | Source: Ambulatory Visit | Attending: Nephrology | Admitting: Nephrology

## 2024-05-12 DIAGNOSIS — N1832 Chronic kidney disease, stage 3b: Secondary | ICD-10-CM

## 2024-05-27 NOTE — Progress Notes (Signed)
 [facility] - Clinic Note  06/08/2024     CHIEF COMPLAINT Patient presents for Retina Follow Up    HISTORY OF PRESENT ILLNESS: Adriana [name] is a 78 y.o. female who presents to the clinic today for:   HPI     Retina Follow Up   In both eyes.  This started 1 year ago.  Duration of 1 year.  Since onset it is stable.  I, the attending physician,  performed the HPI with the patient and updated documentation appropriately.        Comments   1 year retina follow up DM exam pt is reporting no vision changes noticed she denies any flashes or floaters last reading 257 today her last A1C 9.1      Last edited by Valdemar Rogue, MD on 06/09/2024 12:11 AM.     Pt states she has not had any new medical history in the last year, she states her blood sugar is 238 right now, it was 120 this morning before she ate, she is only on insulin    Referring physician: Nichole Senior, MD [address] [place],  [place] [address]  HISTORICAL INFORMATION:   Selected notes from the MEDICAL RECORD NUMBER Referred by Corean Bohr, NP for DM exam LEE:  Ocular Hx- PMH-depression, HTN, high cholesterol, stroke, DM (taking metformin)    CURRENT MEDICATIONS: No current outpatient medications on file. (Ophthalmic Drugs)   No current facility-administered medications for this visit. (Ophthalmic Drugs)   Current Outpatient Medications (Other)  Medication Sig   ACCU-CHEK GUIDE test strip CHECK BLOOD SUGAR THREE TIMES DAILY   acetaminophen  (TYLENOL ) 500 MG tablet Take 500-1,000 mg by mouth every 6 (six) hours as needed (pain.).   ammonium lactate  (AMLACTIN DAILY) 12 % lotion Apply 1 Application topically as needed for dry skin.   buPROPion  (WELLBUTRIN  XL) 300 MG 24 hr tablet Take 300 mg by mouth in the morning.   chlorthalidone  (HYGROTON ) 25 MG tablet Take 25 mg by mouth in the morning.   Cholecalciferol  (VITAMIN D ) 2000 units CAPS Take 2,000 Units by mouth in the  morning.   ciclopirox  (PENLAC ) 8 % solution Apply topically at bedtime. Apply over nail and surrounding skin. Apply daily over previous coat. After seven (7) days, may remove with alcohol and continue cycle.   Continuous Blood Gluc Sensor (FREESTYLE LIBRE 14 DAY SENSOR) MISC 1 each by Does not apply route every 14 (fourteen) days. Change every 2 weeks   Cyanocobalamin (RA VITAMIN B12) 2000 MCG TBCR Take 2,000 mcg by mouth every evening.   HUMALOG  KWIKPEN 100 UNIT/ML KwikPen Inject 10 Units into the skin 3 (three) times daily.   Insulin  Glargine (BASAGLAR  KWIKPEN) 100 UNIT/ML Inject 35 Units into the skin at bedtime.   isosorbide  mononitrate (IMDUR ) 30 MG 24 hr tablet TAKE 1/2 TABLET(15 MG) BY MOUTH DAILY   lisinopril  (PRINIVIL ,ZESTRIL ) 5 MG tablet Take 5 mg by mouth in the morning.   metoprolol  tartrate (LOPRESSOR ) 25 MG tablet Take 25 mg by mouth 2 (two) times daily.   nitroGLYCERIN  (NITROSTAT ) 0.4 MG SL tablet Place 1 tablet (0.4 mg total) under the tongue every 5 (five) minutes as needed for chest pain.   potassium citrate  (UROCIT-K ) 10 MEQ (1080 MG) SR tablet Take 10 mEq by mouth 2 (two) times daily.   promethazine  (PHENERGAN ) 12.5 MG tablet Take 12.5 mg by mouth every evening.   rosuvastatin  (CRESTOR ) 40 MG tablet Take 1 tablet (40 mg total) by mouth every evening.  warfarin (COUMADIN ) 5 MG tablet Take 5 mg by mouth every evening.   white petrolatum  (VASELINE) GEL Apply 1 application topically daily as needed (for psoriasis).   No current facility-administered medications for this visit. (Other)   REVIEW OF SYSTEMS: ROS   Positive for: Endocrine, Cardiovascular, Eyes, Psychiatric Negative for: Constitutional, Gastrointestinal, Neurological, Skin, Genitourinary, Musculoskeletal, HENT, Respiratory, Allergic/Imm, Heme/Lymph Last edited by Resa Delon ORN, COT on 06/08/2024  1:46 PM.      ALLERGIES Allergies  Allergen Reactions   Aspirin  Nausea And Vomiting   Propofol  Nausea And  Vomiting    Confused BP goes up and down.   Boniva [Ibandronate] Other (See Comments)    Unknown-possible nausea   Farxiga [Dapagliflozin] Other (See Comments)    causes UTIs     Sulfa  Antibiotics Nausea Only   Trulicity [Dulaglutide] Other (See Comments)    unknown   PAST MEDICAL HISTORY Past Medical History:  Diagnosis Date   AKI (acute kidney injury) (HCC) 05/02/2018   Carotid artery disease (HCC)    hx of bilat CEA // Carotid US  12/19: R 40-59; L 1-39   Cellulitis of right leg 05/01/2018   Coronary artery disease    Myoview  10/19 High Risk // LHC 11/19 w/ 3 v CAD // Echo 11/19: EF 50-55, gr 1 DD, ant-sept, inf-sept, apical HK // s/p CABG in 11/2018   Depression    denies   DVT (deep venous thrombosis) (HCC)    in setting of Factor V Leiden mutation   Essential hypertension    Factor V Leiden mutation (HCC)    Chronic Warfarin managed by PCP // hx of recurrent DVT; prior CVA   GERD (gastroesophageal reflux disease)    History of kidney stones    History of sepsis 05/01/2018   History of stroke 2004   right side weakness-resolved   HLD (hyperlipidemia)    Myocardial infarction (HCC)    Pneumonia    x2   PONV (postoperative nausea and vomiting)    blood pressure up/down, confusion   Poorly controlled type 2 diabetes mellitus (HCC)    Seizures (HCC)    Sleep apnea    Past Surgical History:  Procedure Laterality Date   ABDOMINAL HYSTERECTOMY  1989   BACK SURGERY     CARDIAC CATHETERIZATION     Carotid artery endarterectomy     right and left side with stents   CATARACT EXTRACTION Bilateral 2017   CHOLECYSTECTOMY     CORONARY ARTERY BYPASS GRAFT N/A 11/17/2018   Procedure: CORONARY ARTERY BYPASS GRAFTING (CABG), ON PUMP, TIMES FIVE, USING LEFT INTERNAL MAMMARY ARTERY AND ENDOSCOPICALLY HARVESTED LEFT GREATER SAPHENOUS VEIN;  Surgeon: Lucas Dorise POUR, MD;  Location: [facility] OR;  Service: Open Heart Surgery;  Laterality: N/A;   LEFT HEART CATH AND CORONARY ANGIOGRAPHY N/A  11/04/2018   Procedure: LEFT HEART CATH AND CORONARY ANGIOGRAPHY;  Surgeon: Wonda Sharper, MD;  Location: [facility] INVASIVE CV LAB;  Service: Cardiovascular;  Laterality: N/A;   LEFT HEART CATH AND CORS/GRAFTS ANGIOGRAPHY N/A 04/30/2023   Procedure: LEFT HEART CATH AND CORS/GRAFTS ANGIOGRAPHY;  Surgeon: Wonda Sharper, MD;  Location: [facility] INVASIVE CV LAB;  Service: Cardiovascular;  Laterality: N/A;   LITHOTRIPSY     x3   TEE WITHOUT CARDIOVERSION N/A 11/17/2018   Procedure: TRANSESOPHAGEAL ECHOCARDIOGRAM (TEE);  Surgeon: Lucas Dorise POUR, MD;  Location: [facility] OR;  Service: Open Heart Surgery;  Laterality: N/A;   TONSILLECTOMY     trigger fingers left and right Bilateral    TUBAL LIGATION  FAMILY HISTORY Family History  Problem Relation Age of Onset   Stroke Mother    Lung cancer Father    COPD Brother    Diabetes Brother    Cataracts Maternal Grandmother    Diabetes Maternal Grandmother    Amblyopia Neg Hx    Blindness Neg Hx    Glaucoma Neg Hx    Hypertension Neg Hx    Macular degeneration Neg Hx    Retinal detachment Neg Hx    Strabismus Neg Hx    Retinitis pigmentosa Neg Hx    Colon cancer Neg Hx    Esophageal cancer Neg Hx    Pancreatic cancer Neg Hx    Stomach cancer Neg Hx    SOCIAL HISTORY Social History   Tobacco Use   Smoking status: Former    Current packs/day: 0.00    Average packs/day: 1 pack/day for 38.0 years (38.0 ttl pk-yrs)    Types: Cigarettes    Start date: 38    Quit date: 2004    Years since quitting: 21.4   Smokeless tobacco: Never  Vaping Use   Vaping status: Never Used  Substance Use Topics   Alcohol use: Never   Drug use: Never       OPHTHALMIC EXAM:  Base Eye Exam     Visual Acuity (Snellen - Linear)       Right Left   Dist [place] 20/60 -2 20/25 -2   Dist ph [place] 20/30 -2 NI         Tonometry (Tonopen, 1:52 PM)       Right Left   Pressure 20 21         Pupils       Pupils Dark Light Shape React APD   Right PERRL 3 2 Round  Brisk None   Left PERRL 3 2 Round Brisk None         Visual Fields       Left Right    Full Full         Extraocular Movement       Right Left    Full, Ortho Full, Ortho         Neuro/Psych     Oriented x3: Yes   Mood/Affect: Normal         Dilation     Both eyes: 2.5% Phenylephrine  @ 1:52 PM           Slit Lamp and Fundus Exam     Slit Lamp Exam       Right Left   Lids/Lashes Dermatochalasis - upper lid, mild MGD Dermatochalasis - upper lid, mild MGD   Conjunctiva/Sclera White and quiet White and quiet   Cornea Well healed cataract wounds, sub epi scar at 1200, Arcus, trace PEE Well healed cataract wounds, Arcus, mild tear film debris, focal corneal scar / haze at 0730 and temporally   Anterior Chamber deep and clear Deep and quiet   Iris Round and dilated, No NVI Round and dilated, No NVI   Lens Tecnis multifocal PC IOL in good position, open PC Tecnis multifocal PC IOL in good position, open PC   Anterior Vitreous Vitreous syneresis, Posterior vitreous detachment mild syneresis         Fundus Exam       Right Left   Disc Pink and Sharp mild pallor, Sharp rim   C/D Ratio 0.5 0.4   Macula Blunted foveal reflex, retinal drusen, Retinal pigment epithelial mottling and clumping, No heme or edema  Blunted foveal reflex, Epiretinal membrane, drusen, Retinal pigment epithelial mottling and clumping, no heme or edema   Vessels attenuated, mild tortuosity attenuated, mild tortuosity   Periphery Attached, inferior paving stone, chorioretinal atrophy at 1030, +reticular degeneration, no heme, mild peripheral drusen Attached, scattered paving stone / chorioretinal atrophy, +reticular degeneration, no heme, mild peripheral drusen           IMAGING AND PROCEDURES  Imaging and Procedures for @TODAY @  OCT, Retina - OU - Both Eyes       Right Eye Quality was good. Central Foveal Thickness: 258. Progression has been stable. Findings include normal foveal  contour, no IRF, no SRF, retinal drusen .   Left Eye Quality was good. Central Foveal Thickness: 270. Progression has been stable. Findings include normal foveal contour, no IRF, no SRF, retinal drusen , epiretinal membrane, macular pucker (Trace ERM and pucker -- stable).   Notes *Images captured and stored on drive  Diagnosis / Impression:  OD: NFP, No IRF/SRF OS: trace ERM and pucker -- stable No DME OU  Clinical management:  See below  Abbreviations: NFP - Normal foveal profile. CME - cystoid macular edema. PED - pigment epithelial detachment. IRF - intraretinal fluid. SRF - subretinal fluid. EZ - ellipsoid zone. ERM - epiretinal membrane. ORA - outer retinal atrophy. ORT - outer retinal tubulation. SRHM - subretinal hyper-reflective material              ASSESSMENT/PLAN:    ICD-10-CM   1. Diabetes mellitus type 2 without retinopathy (HCC)  E11.9 OCT, Retina - OU - Both Eyes    2. Current use of insulin  (HCC)  Z79.4     3. Long term (current) use of oral hypoglycemic drugs  Z79.84     4. Epiretinal membrane (ERM) of left eye  H35.372     5. Early dry stage nonexudative age-related macular degeneration of both eyes  H35.3131     6. Pseudophakia of both eyes  Z96.1       1-3. Diabetes mellitus, type 2 without retinopathy  - pt self reports A1c of  ~8.4 in May 2024 -- pt using Freestyle Libre 2 now - The incidence, risk factors for progression, natural history and treatment options for diabetic retinopathy  were discussed with patient.   - The need for close monitoring of blood glucose, blood pressure, and serum lipids, avoiding cigarette or any type of tobacco, and the need for long term follow up was also discussed with patient. - OCT w/o DME OU - f/u in 1 year, sooner prn  4. Epiretinal membrane OS -- stable - BCVA  20/25 OS -- stable - OCT shows mild ERM and pucker -- stable - not visually significant -- no surgical intervention indicated at this time - cont  monitoring - f/u 1 yr, sooner prn -- DFE, OCT  5. Age related macular degeneration, non-exudative, both eyes -- stable  - The incidence, anatomy, and pathology of dry AMD, risk of progression, and the AREDS and AREDS 2 study including smoking risks discussed with patient.  - Recommend amsler grid monitoring  - f/u 1 yr  6. Pseudophakia OU  - s/p CE/IOL OU -- Tecnis multifocal IOLs by Dr. Jacquelene Blanch  - beautiful surgeries, doing well  - monitor  Ophthalmic Meds Ordered this visit:  No orders of the defined types were placed in this encounter.    Return in about 1 year (around 06/08/2025) for f/u DM exam, DFE, OCT.  There are no Patient  Instructions on file for this visit.  This document serves as a record of services personally performed by Redell JUDITHANN Hans, MD, PhD. It was created on their behalf by Almetta Pesa, an ophthalmic technician. The creation of this record is the provider's dictation and/or activities during the visit.    This document serves as a record of services personally performed by Redell JUDITHANN Hans, MD, PhD. It was created on their behalf by Avelina Pereyra, COA an ophthalmic technician. The creation of this record is the provider's dictation and/or activities during the visit.   Electronically signed by: Avelina GORMAN Pereyra, COT  06/09/24  12:12 AM   This document serves as a record of services personally performed by Redell JUDITHANN Hans, MD, PhD. It was created on their behalf by Alan PARAS. Delores, OA an ophthalmic technician. The creation of this record is the provider's dictation and/or activities during the visit.    Electronically signed by: Alan PARAS. Delores, OA 06/09/24 12:12 AM  Redell JUDITHANN Hans, M.D., Ph.D. Diseases & Surgery of the Retina and Vitreous [facility]  I have reviewed the above documentation for accuracy and completeness, and I agree with the above. Redell JUDITHANN Hans, M.D., Ph.D. 06/09/24 12:13 AM   Abbreviations: M myopia  (nearsighted); A astigmatism; H hyperopia (farsighted); P presbyopia; Mrx spectacle prescription;  CTL contact lenses; OD right eye; OS left eye; OU both eyes  XT exotropia; ET esotropia; PEK punctate epithelial keratitis; PEE punctate epithelial erosions; DES dry eye syndrome; MGD meibomian gland dysfunction; ATs artificial tears; PFAT's preservative free artificial tears; NSC nuclear sclerotic cataract; PSC posterior subcapsular cataract; ERM epi-retinal membrane; PVD posterior vitreous detachment; RD retinal detachment; DM diabetes mellitus; DR diabetic retinopathy; NPDR non-proliferative diabetic retinopathy; PDR proliferative diabetic retinopathy; CSME clinically significant macular edema; DME diabetic macular edema; dbh dot blot hemorrhages; CWS cotton wool spot; POAG primary open angle glaucoma; C/D cup-to-disc ratio; HVF humphrey visual field; GVF goldmann visual field; OCT optical coherence tomography; IOP intraocular pressure; BRVO Branch retinal vein occlusion; CRVO central retinal vein occlusion; CRAO central retinal artery occlusion; BRAO branch retinal artery occlusion; RT retinal tear; SB scleral buckle; PPV pars plana vitrectomy; VH Vitreous hemorrhage; PRP panretinal laser photocoagulation; IVK intravitreal kenalog; VMT vitreomacular traction; MH Macular hole;  NVD neovascularization of the disc; NVE neovascularization elsewhere; AREDS age related eye disease study; ARMD age related macular degeneration; POAG primary open angle glaucoma; EBMD epithelial/anterior basement membrane dystrophy; ACIOL anterior chamber intraocular lens; IOL intraocular lens; PCIOL posterior chamber intraocular lens; Phaco/IOL phacoemulsification with intraocular lens placement; PRK photorefractive keratectomy; LASIK laser assisted in situ keratomileusis; HTN hypertension; DM diabetes mellitus; COPD chronic obstructive pulmonary disease

## 2024-06-03 ENCOUNTER — Ambulatory Visit (INDEPENDENT_AMBULATORY_CARE_PROVIDER_SITE_OTHER): Admitting: Podiatry

## 2024-06-03 DIAGNOSIS — M79674 Pain in right toe(s): Secondary | ICD-10-CM

## 2024-06-03 DIAGNOSIS — B351 Tinea unguium: Secondary | ICD-10-CM | POA: Diagnosis not present

## 2024-06-03 DIAGNOSIS — M79675 Pain in left toe(s): Secondary | ICD-10-CM | POA: Diagnosis not present

## 2024-06-03 NOTE — Progress Notes (Signed)
  Subjective:  Patient ID: Adriana [name], female    DOB: 1946/06/24,  MRN: [id number]  Chief Complaint  Patient presents with   Nail Problem    Nail trim    78 y.o. female returns for the above complaint.  Patient presents with thickened and again dystrophic toenails x 10 mild pain on palpation hurts with ambulation worse with pressure would like to have them to be done she is not able to do it herself.  Denies any other acute complaints at this  Objective:   There were no vitals filed for this visit.  Podiatric Exam: Vascular: dorsalis pedis and posterior tibial pulses are palpable bilateral. Capillary return is immediate. Temperature gradient is WNL. Skin turgor WNL  Sensorium: Normal Semmes Weinstein monofilament test. Normal tactile sensation bilaterally. Nail Exam: Pt has thick disfigured discolored nails with subungual debris noted bilateral entire nail hallux through fifth toenails.  Pain on palpation to the nails. Ulcer Exam: There is no evidence of ulcer or pre-ulcerative changes or infection. Orthopedic Exam: Muscle tone and strength are WNL. No limitations in general ROM. No crepitus or effusions noted.  Skin: No Porokeratosis. No infection or ulcers    Assessment & Plan:   1. Pain due to onychomycosis of toenails of both feet       Patient was evaluated and treated and all questions answered.  Onychomycosis with pain  -Nails palliatively debrided as below. -Educated on self-care  Procedure: Nail Debridement Rationale: pain  Type of Debridement: manual, sharp debridement. Instrumentation: Nail nipper, rotary burr. Number of Nails: 10  Procedures and Treatment: Consent by patient was obtained for treatment procedures. The patient understood the discussion of treatment and procedures well. All questions were answered thoroughly reviewed. Debridement of mycotic and hypertrophic toenails, 1 through 5 bilateral and clearing of subungual debris. No ulceration,  no infection noted.  Return Visit-Office Procedure: Patient instructed to return to the office for a follow up visit 3 months for continued evaluation and treatment.  Tinnie Forehand, DPM    No follow-ups on file.

## 2024-06-08 ENCOUNTER — Ambulatory Visit (INDEPENDENT_AMBULATORY_CARE_PROVIDER_SITE_OTHER): Payer: Medicare Other | Admitting: Ophthalmology

## 2024-06-08 ENCOUNTER — Encounter (INDEPENDENT_AMBULATORY_CARE_PROVIDER_SITE_OTHER): Payer: Self-pay | Admitting: Ophthalmology

## 2024-06-08 DIAGNOSIS — Z794 Long term (current) use of insulin: Secondary | ICD-10-CM | POA: Diagnosis not present

## 2024-06-08 DIAGNOSIS — H353131 Nonexudative age-related macular degeneration, bilateral, early dry stage: Secondary | ICD-10-CM

## 2024-06-08 DIAGNOSIS — H35372 Puckering of macula, left eye: Secondary | ICD-10-CM | POA: Diagnosis not present

## 2024-06-08 DIAGNOSIS — Z961 Presence of intraocular lens: Secondary | ICD-10-CM

## 2024-06-08 DIAGNOSIS — E119 Type 2 diabetes mellitus without complications: Secondary | ICD-10-CM | POA: Diagnosis not present

## 2024-06-08 DIAGNOSIS — Z7984 Long term (current) use of oral hypoglycemic drugs: Secondary | ICD-10-CM | POA: Diagnosis not present

## 2024-06-09 ENCOUNTER — Encounter (INDEPENDENT_AMBULATORY_CARE_PROVIDER_SITE_OTHER): Payer: Self-pay | Admitting: Ophthalmology

## 2024-07-30 ENCOUNTER — Other Ambulatory Visit: Payer: Self-pay | Admitting: Cardiovascular Disease

## 2024-08-19 ENCOUNTER — Ambulatory Visit (INDEPENDENT_AMBULATORY_CARE_PROVIDER_SITE_OTHER): Admitting: Podiatry

## 2024-08-19 ENCOUNTER — Encounter: Payer: Self-pay | Admitting: Podiatry

## 2024-08-19 DIAGNOSIS — L600 Ingrowing nail: Secondary | ICD-10-CM | POA: Diagnosis not present

## 2024-08-19 NOTE — Patient Instructions (Signed)

## 2024-08-19 NOTE — Progress Notes (Signed)
 Patient complains of painful ingrown lateral border border(s) hallux right.. Patient denies fevers, chills, nausea, vomiting.  Objective:  Vitals: Reviewed  General: Well developed, nourished, in no acute distress, alert and oriented x3   Vascular: DP pulse 2/4 bilateral. PT pulse 2/4 bilateral.  Mild to moderate edema hallux right.  Dermatology: Erythema, edema, incurvated nail border lateral border hallux right with clear drainage . Tenderness present with palpation. Normal skin tone and texture feet with normal hair growth.  Neurological: Grossly intact. Normal reflexes.   Musculoskeletal: Tenderness with palpation of the distal hallux right. No tenderness or painful ROM at IPJ.  Diagnosis: Ingrown nail lateral border hallux nail right  Plan: -discussed etiology and treatment of ingrown nails. Discussed surgical vs conservative treatment. -Consent signed for appropriate matrixectomy affected nail(s).   Procedure(s):   - Matrixectomy(s) lateral border hallux nail right: Toe anesthetized with 3cc 2:1 mixture 2% Lidocaine  with epinephrine : Sodium Bicarbonate . Surgical site prepped. Digital tourniquet applied.  Avulsion of nail border. performed. Matrixecomy performed with three 30 second applications of phenol to nail matrix. Site irrigated with alcohol.  Tourniquet released with good vascularity noticed in digit.  Applied triple antibiotic to nailbed and applied gauze and Coban dressing. - Written and oral postoperative instructions given.  -Return for post-op 2 weeks.  JINNY Prentice Binder, DPM

## 2024-09-02 ENCOUNTER — Ambulatory Visit (INDEPENDENT_AMBULATORY_CARE_PROVIDER_SITE_OTHER): Admitting: Podiatry

## 2024-09-02 DIAGNOSIS — M79675 Pain in left toe(s): Secondary | ICD-10-CM

## 2024-09-02 DIAGNOSIS — M79674 Pain in right toe(s): Secondary | ICD-10-CM | POA: Diagnosis not present

## 2024-09-02 DIAGNOSIS — B351 Tinea unguium: Secondary | ICD-10-CM | POA: Diagnosis not present

## 2024-09-02 NOTE — Progress Notes (Signed)
  Subjective:  Patient ID: Adriana [name], female    DOB: February 18, 1946,  MRN: [id number]  Chief Complaint  Patient presents with   Nail Problem    Nail trim    78 y.o. female returns for the above complaint.  Patient presents with thickened and again dystrophic toenails x 10 mild pain on palpation hurts with ambulation worse with pressure would like to have them to be done she is not able to do it herself.  Denies any other acute complaints at this  Objective:   There were no vitals filed for this visit.  Podiatric Exam: Vascular: dorsalis pedis and posterior tibial pulses are palpable bilateral. Capillary return is immediate. Temperature gradient is WNL. Skin turgor WNL  Sensorium: Normal Semmes Weinstein monofilament test. Normal tactile sensation bilaterally. Nail Exam: Pt has thick disfigured discolored nails with subungual debris noted bilateral entire nail hallux through fifth toenails.  Pain on palpation to the nails. Ulcer Exam: There is no evidence of ulcer or pre-ulcerative changes or infection. Orthopedic Exam: Muscle tone and strength are WNL. No limitations in general ROM. No crepitus or effusions noted.  Skin: No Porokeratosis. No infection or ulcers    Assessment & Plan:   No diagnosis found.     Patient was evaluated and treated and all questions answered.  Onychomycosis with pain  -Nails palliatively debrided as below. -Educated on self-care  Procedure: Nail Debridement Rationale: pain  Type of Debridement: manual, sharp debridement. Instrumentation: Nail nipper, rotary burr. Number of Nails: 10  Procedures and Treatment: Consent by patient was obtained for treatment procedures. The patient understood the discussion of treatment and procedures well. All questions were answered thoroughly reviewed. Debridement of mycotic and hypertrophic toenails, 1 through 5 bilateral and clearing of subungual debris. No ulceration, no infection noted.  Return  Visit-Office Procedure: Patient instructed to return to the office for a follow up visit 3 months for continued evaluation and treatment.  Franky Blanch, DPM    No follow-ups on file.

## 2024-09-09 ENCOUNTER — Ambulatory Visit: Admitting: Podiatry

## 2024-09-20 ENCOUNTER — Emergency Department (HOSPITAL_COMMUNITY)

## 2024-09-20 ENCOUNTER — Ambulatory Visit (HOSPITAL_COMMUNITY): Admission: EM | Admit: 2024-09-20 | Discharge: 2024-09-20 | Disposition: A | Discharge status: Home / Self Care

## 2024-09-20 ENCOUNTER — Encounter (HOSPITAL_COMMUNITY): Payer: Self-pay | Admitting: *Deleted

## 2024-09-20 ENCOUNTER — Encounter (HOSPITAL_COMMUNITY): Payer: Self-pay

## 2024-09-20 ENCOUNTER — Emergency Department (HOSPITAL_COMMUNITY)
Admission: EM | Admit: 2024-09-20 | Discharge: 2024-09-20 | Disposition: A | Discharge status: Home / Self Care | Attending: Emergency Medicine | Admitting: Emergency Medicine

## 2024-09-20 ENCOUNTER — Other Ambulatory Visit: Payer: Self-pay

## 2024-09-20 DIAGNOSIS — R112 Nausea with vomiting, unspecified: Secondary | ICD-10-CM | POA: Insufficient documentation

## 2024-09-20 DIAGNOSIS — Z794 Long term (current) use of insulin: Secondary | ICD-10-CM | POA: Diagnosis not present

## 2024-09-20 DIAGNOSIS — I482 Chronic atrial fibrillation, unspecified: Secondary | ICD-10-CM | POA: Insufficient documentation

## 2024-09-20 DIAGNOSIS — R197 Diarrhea, unspecified: Secondary | ICD-10-CM | POA: Insufficient documentation

## 2024-09-20 DIAGNOSIS — Z7901 Long term (current) use of anticoagulants: Secondary | ICD-10-CM | POA: Insufficient documentation

## 2024-09-20 DIAGNOSIS — K921 Melena: Secondary | ICD-10-CM | POA: Diagnosis not present

## 2024-09-20 DIAGNOSIS — I2510 Atherosclerotic heart disease of native coronary artery without angina pectoris: Secondary | ICD-10-CM | POA: Insufficient documentation

## 2024-09-20 DIAGNOSIS — R1013 Epigastric pain: Secondary | ICD-10-CM | POA: Insufficient documentation

## 2024-09-20 DIAGNOSIS — N189 Chronic kidney disease, unspecified: Secondary | ICD-10-CM | POA: Insufficient documentation

## 2024-09-20 LAB — COMPREHENSIVE METABOLIC PANEL WITH GFR
ALT: 64 U/L — ABNORMAL HIGH (ref 0–44)
AST: 54 U/L — ABNORMAL HIGH (ref 15–41)
Albumin: 3.3 g/dL — ABNORMAL LOW (ref 3.5–5.0)
Alkaline Phosphatase: 77 U/L (ref 38–126)
Anion gap: 12 (ref 5–15)
BUN: 23 mg/dL (ref 8–23)
CO2: 25 mmol/L (ref 22–32)
Calcium: 8 mg/dL — ABNORMAL LOW (ref 8.9–10.3)
Chloride: 103 mmol/L (ref 98–111)
Creatinine, Ser: 1.32 mg/dL — ABNORMAL HIGH (ref 0.44–1.00)
GFR, Estimated: 42 mL/min — ABNORMAL LOW (ref >60)
Glucose, Bld: 149 mg/dL — ABNORMAL HIGH (ref 70–99)
Potassium: 4.7 mmol/L (ref 3.5–5.1)
Sodium: 140 mmol/L (ref 135–145)
Total Bilirubin: 0.4 mg/dL (ref 0.0–1.2)
Total Protein: 6.8 g/dL (ref 6.5–8.1)

## 2024-09-20 LAB — CBG MONITORING, ED: Glucose-Capillary: 64 mg/dL — ABNORMAL LOW (ref 70–99)

## 2024-09-20 LAB — PROTIME-INR
INR: 2.7 — ABNORMAL HIGH (ref 0.8–1.2)
Prothrombin Time: 29.8 s — ABNORMAL HIGH (ref 11.4–15.2)

## 2024-09-20 LAB — TYPE AND SCREEN
ABO/RH(D): B POS
Antibody Screen: NEGATIVE

## 2024-09-20 LAB — CBC
HCT: 44.2 % (ref 36.0–46.0)
Hemoglobin: 13.9 g/dL (ref 12.0–15.0)
MCH: 29.7 pg (ref 26.0–34.0)
MCHC: 31.4 g/dL (ref 30.0–36.0)
MCV: 94.4 fL (ref 80.0–100.0)
Platelets: 216 K/uL (ref 150–400)
RBC: 4.68 MIL/uL (ref 3.87–5.11)
RDW: 14.1 % (ref 11.5–15.5)
WBC: 6.4 K/uL (ref 4.0–10.5)
nRBC: 0 % (ref 0.0–0.2)

## 2024-09-20 LAB — POC OCCULT BLOOD, ED: Fecal Occult Bld: NEGATIVE

## 2024-09-20 LAB — LIPASE, BLOOD: Lipase: 29 U/L (ref 11–51)

## 2024-09-20 MED ORDER — ONDANSETRON HCL 4 MG/2ML IJ SOLN: 4.0000 mg | Freq: Once | INTRAMUSCULAR | Status: DC

## 2024-09-20 MED ORDER — SODIUM CHLORIDE 0.9 % IV BOLUS
1000.0000 mL | Freq: Once | INTRAVENOUS | Status: AC
  Administered 2024-09-20: 1000 mL via INTRAVENOUS

## 2024-09-20 MED ORDER — ONDANSETRON HCL 4 MG/2ML IJ SOLN
INTRAMUSCULAR | Status: AC
  Filled 2024-09-20: qty 2

## 2024-09-20 MED ORDER — ONDANSETRON HCL 4 MG/2ML IJ SOLN
4.0000 mg | Freq: Once | INTRAMUSCULAR | Status: AC
  Administered 2024-09-20: 4 mg via INTRAVENOUS
  Filled 2024-09-20: qty 2

## 2024-09-20 MED ORDER — DEXTROSE 50 % IV SOLN
INTRAVENOUS | Status: AC
  Administered 2024-09-20: 50 mL
  Filled 2024-09-20: qty 50

## 2024-09-20 MED ORDER — SODIUM CHLORIDE 0.9 % IV BOLUS: 500.0000 mL | Freq: Once | INTRAVENOUS | Status: DC

## 2024-09-20 MED ORDER — ONDANSETRON HCL 4 MG/2ML IJ SOLN
8.0000 mg | Freq: Once | INTRAMUSCULAR | Status: AC
  Administered 2024-09-20: 8 mg via INTRAMUSCULAR

## 2024-09-20 MED ORDER — IOHEXOL 350 MG/ML SOLN
100.0000 mL | Freq: Once | INTRAVENOUS | Status: AC | PRN
  Administered 2024-09-20: 100 mL via INTRAVENOUS

## 2024-09-20 MED ORDER — PANTOPRAZOLE SODIUM 40 MG IV SOLR
40.0000 mg | Freq: Two times a day (BID) | INTRAVENOUS | Status: DC
  Administered 2024-09-20: 40 mg via INTRAVENOUS

## 2024-09-20 MED ORDER — ONDANSETRON 4 MG PO TBDP: 4.0000 mg | ORAL_TABLET | Freq: Three times a day (TID) | ORAL | 0 refills | Status: AC | PRN

## 2024-09-20 MED ORDER — HYDROMORPHONE HCL 1 MG/ML IJ SOLN
0.5000 mg | Freq: Once | INTRAMUSCULAR | Status: AC
  Administered 2024-09-20: 0.5 mg via INTRAVENOUS
  Filled 2024-09-20: qty 1

## 2024-09-20 MED ORDER — PANTOPRAZOLE SODIUM 40 MG IV SOLR
40.0000 mg | INTRAVENOUS | Status: AC
  Filled 2024-09-20: qty 10

## 2024-09-20 MED ORDER — PANTOPRAZOLE SODIUM 20 MG PO TBEC: 20.0000 mg | DELAYED_RELEASE_TABLET | Freq: Every day | ORAL | 2 refills | Status: AC

## 2024-09-20 NOTE — Discharge Instructions (Addendum)
 Please report to the ER for further evaluation.  With dark liquid stools we need to get imaging studies to ensure that you are not having a GI bleed.

## 2024-09-20 NOTE — ED Triage Notes (Signed)
 Also, patient states she has upper gastric pain.

## 2024-09-20 NOTE — ED Provider Notes (Signed)
 [facility]    CSN: [id number] Arrival date & time: 09/20/24  1226      History   Chief Complaint Chief Complaint  Patient presents with   Diarrhea   Emesis    HPI Adriana [name] is a 78 y.o. female.   Patient presents today due to 6 days worth of watery, black stools, nausea, and vomiting.  Patient states that she uses warfarin chronically for anticoagulation.  Patient states that she has been experiencing 3 episodes of vomiting daily which have become dry heaves and is experiencing dark liquid stools every 1-2 hours.  Patient states that she is experiencing persistent 8/10 epigastric abdominal pain.  States that she has been trying to eat BRAT diet but is unable to tolerate.   Diarrhea Associated symptoms: vomiting   Emesis Associated symptoms: diarrhea     Past Medical History:  Diagnosis Date   AKI (acute kidney injury) 05/02/2018   Carotid artery disease    hx of bilat CEA // Carotid US  12/19: R 40-59; L 1-39   Cellulitis of right leg 05/01/2018   Coronary artery disease    Myoview  10/19 High Risk // LHC 11/19 w/ 3 v CAD // Echo 11/19: EF 50-55, gr 1 DD, ant-sept, inf-sept, apical HK // s/p CABG in 11/2018   Depression    denies   DVT (deep venous thrombosis) (HCC)    in setting of Factor V Leiden mutation   Essential hypertension    Factor V Leiden mutation    Chronic Warfarin managed by PCP // hx of recurrent DVT; prior CVA   GERD (gastroesophageal reflux disease)    History of kidney stones    History of sepsis 05/01/2018   History of stroke 2004   right side weakness-resolved   HLD (hyperlipidemia)    Myocardial infarction (HCC)    Pneumonia    x2   PONV (postoperative nausea and vomiting)    blood pressure up/down, confusion   Poorly controlled type 2 diabetes mellitus (HCC)    Seizures (HCC)    Sleep apnea     Patient Active Problem List   Diagnosis Date Noted   Intolerance of continuous positive airway pressure (CPAP)  ventilation 03/28/2023   Chronic intermittent hypoxia with obstructive sleep apnea 03/28/2023   Acquired thrombophilia 08/02/2022   Diarrhea 08/02/2022   History of stroke 08/02/2022   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, with stage 1 through stage 4 chronic kidney disease, or unspecified chronic kidney disease 08/02/2022   Multinodular goiter 08/02/2022   Atrial fibrillation, transient (HCC) 09/15/2020   OSA (obstructive sleep apnea) 07/18/2020   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 07/18/2020   Chronic kidney disease, stage 3b (HCC) 12/28/2019   Hyperkalemia 05/22/2019   Anxiety disorder 04/17/2019   Hardening of the aorta (main artery of the heart) 04/17/2019   Vitamin B deficiency 04/17/2019   CAD (coronary artery disease) 03/17/2019   Carotid artery disease    Postoperative atrial fibrillation (HCC) 12/08/2018   Peripheral vascular disease 12/02/2018   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   Abnormal nuclear cardiac imaging test 11/04/2018   Long term (current) use of insulin  (HCC) 05/21/2018   AKI (acute kidney injury) 05/02/2018   Sepsis (HCC) 05/01/2018   Cellulitis of right leg 05/01/2018   Abdominal pain 05/01/2018   Depression    DVT (deep venous thrombosis) (HCC)    Essential hypertension    Factor V Leiden mutation    HLD (hyperlipidemia)  Poorly controlled type 2 diabetes mellitus (HCC)    Stroke (cerebrum) (HCC)    Age-related osteoporosis without current pathological fracture 03/28/2018   Gastro-esophageal reflux disease without esophagitis 03/28/2018   Long term (current) use of anticoagulants 03/28/2018   Occlusion and stenosis of bilateral carotid arteries 03/28/2018   Tobacco user 03/28/2018   Poorly controlled type 2 diabetes mellitus with circulatory disorder (HCC) 03/27/2018   Decreased urine volume 01/23/2016   Gout, unspecified 01/23/2016   Hypercalciuria 01/23/2016   Hyperuricosuria 01/23/2016    Primary hyperoxaluria 01/23/2016   Arthritis 06/22/2014   Recurrent nephrolithiasis 05/31/2012   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    Past Surgical History:  Procedure Laterality Date   ABDOMINAL HYSTERECTOMY  1989   BACK SURGERY     CARDIAC CATHETERIZATION     Carotid artery endarterectomy     right and left side with stents   CATARACT EXTRACTION Bilateral 2017   CHOLECYSTECTOMY     CORONARY ARTERY BYPASS GRAFT N/A 11/17/2018   Procedure: CORONARY ARTERY BYPASS GRAFTING (CABG), ON PUMP, TIMES FIVE, USING LEFT INTERNAL MAMMARY ARTERY AND ENDOSCOPICALLY HARVESTED LEFT GREATER SAPHENOUS VEIN;  Surgeon: Lucas Dorise POUR, MD;  Location: [facility] OR;  Service: Open Heart Surgery;  Laterality: N/A;   LEFT HEART CATH AND CORONARY ANGIOGRAPHY N/A 11/04/2018   Procedure: LEFT HEART CATH AND CORONARY ANGIOGRAPHY;  Surgeon: Wonda Sharper, MD;  Location: [facility] INVASIVE CV LAB;  Service: Cardiovascular;  Laterality: N/A;   LEFT HEART CATH AND CORS/GRAFTS ANGIOGRAPHY N/A 04/30/2023   Procedure: LEFT HEART CATH AND CORS/GRAFTS ANGIOGRAPHY;  Surgeon: Wonda Sharper, MD;  Location: [facility] INVASIVE CV LAB;  Service: Cardiovascular;  Laterality: N/A;   LITHOTRIPSY     x3   TEE WITHOUT CARDIOVERSION N/A 11/17/2018   Procedure: TRANSESOPHAGEAL ECHOCARDIOGRAM (TEE);  Surgeon: Lucas Dorise POUR, MD;  Location: [facility] OR;  Service: Open Heart Surgery;  Laterality: N/A;   TONSILLECTOMY     trigger fingers left and right Bilateral    TUBAL LIGATION      OB History   No obstetric history on file.      Home Medications    Prior to Admission medications   Medication Sig Start Date End Date Taking? Authorizing Provider  ACCU-CHEK GUIDE test strip CHECK BLOOD SUGAR THREE TIMES DAILY 03/18/21  Yes [provider]  buPROPion  (WELLBUTRIN  XL) 300 MG 24 hr tablet Take 300 mg by mouth in the morning. 07/30/18  Yes [provider]  chlorthalidone  (HYGROTON ) 25 MG tablet Take 25 mg by mouth in the morning.    Yes [provider]  Cholecalciferol  (VITAMIN D ) 2000 units CAPS Take 2,000 Units by mouth in the morning.   Yes [provider]  Continuous Blood Gluc Sensor (FREESTYLE LIBRE 14 DAY SENSOR) MISC 1 each by Does not apply route every 14 (fourteen) days. Change every 2 weeks 03/27/18  Yes Trixie File, MD  Cyanocobalamin (RA VITAMIN B12) 2000 MCG TBCR Take 2,000 mcg by mouth every evening.   Yes [provider]  HUMALOG  KWIKPEN 100 UNIT/ML KwikPen Inject 10 Units into the skin 3 (three) times daily. 10/23/23  Yes [provider]  Insulin  Glargine (BASAGLAR  KWIKPEN) 100 UNIT/ML Inject 35 Units into the skin at bedtime.   Yes [provider]  isosorbide  mononitrate (IMDUR ) 30 MG 24 hr tablet TAKE 1/2 TABLET(15 MG) BY MOUTH DAILY 02/13/24  Yes Wonda Sharper, MD  lisinopril  (PRINIVIL ,ZESTRIL ) 5 MG tablet Take 5 mg by mouth in the morning.   Yes [provider]  metoprolol  tartrate (LOPRESSOR ) 25 MG tablet Take 25 mg by mouth 2 (two) times daily.   Yes [provider]  nitroGLYCERIN  (NITROSTAT ) 0.4 MG SL tablet Place 1 tablet (0.4 mg total) under the tongue every 5 (five) minutes as needed for chest pain. 02/26/23  Yes Conte, Tessa N, PA-C  potassium citrate  (UROCIT-K ) 10 MEQ (1080 MG) SR tablet Take 10 mEq by mouth 2 (two) times daily. 11/17/19  Yes [provider]  promethazine  (PHENERGAN ) 12.5 MG tablet Take 12.5 mg by mouth every evening. 04/14/18  Yes [provider]  rosuvastatin  (CRESTOR ) 40 MG tablet TAKE 1 TABLET(40 MG) BY MOUTH EVERY EVENING 07/30/24  Yes Wonda Sharper, MD  warfarin (COUMADIN ) 5 MG tablet Take 5 mg by mouth every evening.   Yes [provider]  white petrolatum  (VASELINE) GEL Apply 1 application topically daily as needed (for psoriasis).   Yes [provider]  acetaminophen  (TYLENOL ) 500 MG tablet Take 500-1,000 mg by mouth every 6 (six) hours as needed (pain.).    [provider]  ammonium lactate  (AMLACTIN DAILY) 12 % lotion Apply 1 Application topically as needed for dry skin. 06/19/23   Tobie Franky SQUIBB, DPM  ciclopirox  (PENLAC ) 8 % solution Apply topically at bedtime. Apply over nail and surrounding skin. Apply daily over previous coat. After seven (7) days, may remove with alcohol and continue cycle. 03/13/23   Tobie Franky SQUIBB, DPM    Family History Family History  Problem Relation Age of Onset   Stroke Mother    Lung cancer Father    COPD Brother    Diabetes Brother    Cataracts Maternal Grandmother    Diabetes Maternal Grandmother    Amblyopia Neg Hx    Blindness Neg Hx    Glaucoma Neg Hx    Hypertension Neg Hx    Macular degeneration Neg Hx    Retinal detachment Neg Hx    Strabismus Neg Hx    Retinitis pigmentosa Neg Hx    Colon cancer Neg Hx    Esophageal cancer Neg Hx    Pancreatic cancer Neg Hx    Stomach cancer Neg Hx     Social History Social History   Tobacco Use   Smoking status: Former    Current packs/day: 0.00    Average packs/day: 1 pack/day for 38.0 years (38.0 ttl pk-yrs)    Types: Cigarettes    Start date: 1966    Quit date: 2004    Years since quitting: 21.7   Smokeless tobacco: Never  Vaping Use   Vaping status: Never Used  Substance Use Topics   Alcohol use: Never   Drug use: Never     Allergies   Aspirin , Propofol , Boniva [ibandronate], Farxiga [dapagliflozin], Sulfa  antibiotics, and Trulicity [dulaglutide]   Review of Systems Review of Systems  Gastrointestinal:  Positive for diarrhea and vomiting.     Physical Exam Triage Vital Signs ED Triage Vitals  Encounter Vitals Group     BP 09/20/24 1349 119/60     Girls Systolic BP Percentile --      Girls Diastolic BP Percentile --      Boys Systolic BP Percentile --      Boys Diastolic BP Percentile --      Pulse Rate 09/20/24 1349 68     Resp 09/20/24 1349 18     Temp 09/20/24 1349 97.8 F (36.6 C)     Temp Source 09/20/24 1349 Oral      SpO2 09/20/24 1349 95 %  Weight --      Height --      Head Circumference --      Peak Flow --      Pain Score 09/20/24 1350 7     Pain Loc --      Pain Education --      Exclude from Growth Chart --    No data found.  Updated Vital Signs BP 119/60 (BP Location: Left Arm)   Pulse 68   Temp 97.8 F (36.6 C) (Oral)   Resp 18   SpO2 95%   Visual Acuity Right Eye Distance:   Left Eye Distance:   Bilateral Distance:    Right Eye Near:   Left Eye Near:    Bilateral Near:     Physical Exam Vitals and nursing note reviewed.  Constitutional:      General: She is not in acute distress.    Appearance: Normal appearance. She is ill-appearing. She is not toxic-appearing or diaphoretic.  Eyes:     General: No scleral icterus. Cardiovascular:     Rate and Rhythm: Normal rate and regular rhythm.     Heart sounds: Normal heart sounds.  Pulmonary:     Effort: Pulmonary effort is normal. No respiratory distress.     Breath sounds: Normal breath sounds. No wheezing or rhonchi.  Abdominal:     General: Bowel sounds are decreased. There is distension.     Tenderness: There is abdominal tenderness in the epigastric area.     Comments: Marked tenderness to palpation of epigastrium   Skin:    General: Skin is warm.  Neurological:     Mental Status: She is alert and oriented to person, place, and time.  Psychiatric:        Mood and Affect: Mood normal.        Behavior: Behavior normal.      UC Treatments / Results  Labs (all labs ordered are listed, but only abnormal results are displayed) Labs Reviewed - No data to display  EKG   Radiology No results found.  Procedures Procedures (including critical care time)  Medications Ordered in UC Medications  ondansetron  (ZOFRAN ) injection 8 mg (has no administration in time range)    Initial Impression / Assessment and Plan / UC Course  I have reviewed the triage vital signs and the nursing notes.  Pertinent labs &  imaging results that were available during my care of the patient were reviewed by me and considered in my medical decision making (see chart for details).     Melena-concern for GI bleed will send to ER for imaging studies.  Given Zofran  for nausea before sending to ER. Final Clinical Impressions(s) / UC Diagnoses   Final diagnoses:  Melena  Nausea and vomiting, unspecified vomiting type  Abdominal pain, epigastric     Discharge Instructions      Please report to the ER for further evaluation.  With dark liquid stools we need to get imaging studies to ensure that you are not having a GI bleed.    ED Prescriptions   None    PDMP not reviewed this encounter.   Andra Corean BROCKS, PA-C 09/20/24 1446

## 2024-09-20 NOTE — ED Notes (Signed)
 Pt brought to [facility], unable to provide stool sample at this time. Pt states I got everything out that was in me. MD made aware.

## 2024-09-20 NOTE — ED Provider Notes (Signed)
 [place] EMERGENCY DEPARTMENT AT [facility] Provider Note   CSN: [id number] Arrival date & time: 09/20/24  1457     Patient presents with: Abdominal Pain and Emesis   Adriana [name] is a 78 y.o. female.  {Add pertinent medical, surgical, social history, OB history to HPI:1646} 78 year old female with a history of atrial fibrillation on warfarin, CAD, CKD, CVA, and factor V Leiden who presents to the emergency department with abdominal pain, nausea, vomiting and diarrhea.  Symptoms started 5 days ago.  Started having dark black stools.  Says that they are very runny.  Says that she is having them almost every hour.  Also has had some dry heaving and vomiting 2-3 times a day.  No blood in it.  Tried taking Pepto-Bismol but says the black stool started prior to this.  Also has been having epigastric abdominal pain.  Went to urgent care who referred her to the emergency department.  Says she has had C. difficile before but no recent antibiotics, healthcare exposures, or foreign travel       Prior to Admission medications   Medication Sig Start Date End Date Taking? Authorizing Provider  ACCU-CHEK GUIDE test strip CHECK BLOOD SUGAR THREE TIMES DAILY 03/18/21   [provider]  acetaminophen  (TYLENOL ) 500 MG tablet Take 500-1,000 mg by mouth every 6 (six) hours as needed (pain.).    [provider]  ammonium lactate  (AMLACTIN DAILY) 12 % lotion Apply 1 Application topically as needed for dry skin. 06/19/23   Tobie Franky SQUIBB, DPM  buPROPion  (WELLBUTRIN  XL) 300 MG 24 hr tablet Take 300 mg by mouth in the morning. 07/30/18   [provider]  chlorthalidone  (HYGROTON ) 25 MG tablet Take 25 mg by mouth in the morning.    [provider]  Cholecalciferol  (VITAMIN D ) 2000 units CAPS Take 2,000 Units by mouth in the morning.    [provider]  ciclopirox  (PENLAC ) 8 % solution Apply topically at bedtime. Apply over nail and surrounding skin.  Apply daily over previous coat. After seven (7) days, may remove with alcohol and continue cycle. 03/13/23   Tobie Franky SQUIBB, DPM  Continuous Blood Gluc Sensor (FREESTYLE LIBRE 14 DAY SENSOR) MISC 1 each by Does not apply route every 14 (fourteen) days. Change every 2 weeks 03/27/18   Trixie File, MD  Cyanocobalamin (RA VITAMIN B12) 2000 MCG TBCR Take 2,000 mcg by mouth every evening.    [provider]  HUMALOG  KWIKPEN 100 UNIT/ML KwikPen Inject 10 Units into the skin 3 (three) times daily. 10/23/23   [provider]  Insulin  Glargine (BASAGLAR  KWIKPEN) 100 UNIT/ML Inject 35 Units into the skin at bedtime.    [provider]  isosorbide  mononitrate (IMDUR ) 30 MG 24 hr tablet TAKE 1/2 TABLET(15 MG) BY MOUTH DAILY 02/13/24   Wonda Sharper, MD  lisinopril  (PRINIVIL ,ZESTRIL ) 5 MG tablet Take 5 mg by mouth in the morning.    [provider]  metoprolol  tartrate (LOPRESSOR ) 25 MG tablet Take 25 mg by mouth 2 (two) times daily.    [provider]  nitroGLYCERIN  (NITROSTAT ) 0.4 MG SL tablet Place 1 tablet (0.4 mg total) under the tongue every 5 (five) minutes as needed for chest pain. 02/26/23   Lucien Orren SAILOR, PA-C  potassium citrate  (UROCIT-K ) 10 MEQ (1080 MG) SR tablet Take 10 mEq by mouth 2 (two) times daily. 11/17/19   [provider]  promethazine  (PHENERGAN ) 12.5 MG tablet Take 12.5 mg by mouth every evening.  04/14/18   [provider]  rosuvastatin  (CRESTOR ) 40 MG tablet TAKE 1 TABLET(40 MG) BY MOUTH EVERY EVENING 07/30/24   Wonda Sharper, MD  warfarin (COUMADIN ) 5 MG tablet Take 5 mg by mouth every evening.    [provider]  white petrolatum  (VASELINE) GEL Apply 1 application topically daily as needed (for psoriasis).    [provider]    Allergies: Aspirin , Propofol , Boniva [ibandronate], Farxiga [dapagliflozin], Sulfa  antibiotics, and Trulicity [dulaglutide]    Review of Systems  Updated Vital Signs BP (!)  155/61   Pulse 61   Temp 97.9 F (36.6 C)   Resp 16   Ht 5' 6 (1.676 m)   Wt 68.6 kg   SpO2 95%   BMI 24.41 kg/m   Physical Exam Vitals and nursing note reviewed.  Constitutional:      General: She is not in acute distress.    Appearance: She is well-developed.  HENT:     Head: Normocephalic and atraumatic.     Right Ear: External ear normal.     Left Ear: External ear normal.     Nose: Nose normal.  Eyes:     Extraocular Movements: Extraocular movements intact.     Conjunctiva/sclera: Conjunctivae normal.     Pupils: Pupils are equal, round, and reactive to light.  Abdominal:     General: Abdomen is flat. There is no distension.     Palpations: Abdomen is soft. There is no mass.     Tenderness: There is abdominal tenderness (Epigastrium). There is no guarding.  Genitourinary:    Comments: Chaperoned by patient's RN Adriana [name]. No external hemorrhoids or fissures noted on external inspection. No internal masses or hemorroids noted. Normal rectal tone.  Black stool in rectal vault. No bright red blood.  Skin:    General: Skin is warm and dry.  Neurological:     Mental Status: She is alert and oriented to person, place, and time. Mental status is at baseline.  Psychiatric:        Mood and Affect: Mood normal.     (all labs ordered are listed, but only abnormal results are displayed) Labs Reviewed  COMPREHENSIVE METABOLIC PANEL WITH GFR - Abnormal; Notable for the following components:      Result Value   Glucose, Bld 149 (*)    Creatinine, Ser 1.32 (*)    Calcium  8.0 (*)    Albumin  3.3 (*)    AST 54 (*)    ALT 64 (*)    GFR, Estimated 42 (*)    All other components within normal limits  PROTIME-INR - Abnormal; Notable for the following components:   Prothrombin Time 29.8 (*)    INR 2.7 (*)    All other components within normal limits  C DIFFICILE QUICK SCREEN W PCR REFLEX    GASTROINTESTINAL PANEL BY PCR, STOOL (REPLACES STOOL CULTURE)  CBC  LIPASE, BLOOD   URINALYSIS, ROUTINE W REFLEX MICROSCOPIC  POC OCCULT BLOOD, ED  TYPE AND SCREEN    EKG: None  Radiology: No results found.  {Document cardiac monitor, telemetry assessment procedure when appropriate:32947} Procedures   Medications Ordered in the ED  pantoprazole  (PROTONIX ) injection 40 mg ( Intravenous Canceled Entry 09/20/24 1640)    Followed by  pantoprazole  (PROTONIX ) injection 40 mg (has no administration in time range)  ondansetron  (ZOFRAN ) injection 4 mg (has no administration in time range)  sodium chloride  0.9 % bolus 1,000 mL (has no administration in time range)  HYDROmorphone (DILAUDID) injection 0.5  mg (has no administration in time range)    Clinical Course as of 09/20/24 2100  Sun Sep 20, 2024  2012 Blood sugar was 164 [RP]    Clinical Course User Index [RP] Yolande Lamar BROCKS, MD   {Click here for ABCD2, HEART and other calculators REFRESH Note before signing:1}                              Medical Decision Making Amount and/or Complexity of Data Reviewed Labs: ordered. Radiology: ordered.  Risk Prescription drug management.   Sheika Coutts is a 78 year old female with a history of atrial fibrillation on warfarin, CAD, CKD, CVA, and factor V Leiden who presents to the emergency department with abdominal pain, nausea, vomiting and diarrhea.   Initial Ddx:  ***   MDM/Course:  *** Upon re-evaluation ***  This patient presents to the ED for concern of complaints listed in HPI, this involves an extensive number of treatment options, and is a complaint that carries with it a high risk of complications and morbidity. Disposition including potential need for admission considered.   Dispo: {Disposition:28069}  Additional history obtained from {Additional History:28067} Records reviewed {Records Reviewed:28068} The following labs were independently interpreted: {labs interpreted:28064} and show {lab findings:28250} I independently reviewed the  following imaging with scope of interpretation limited to determining acute life threatening conditions related to emergency care: {imaging interpreted:28065} and agree with the radiologist interpretation with the following exceptions: none I personally reviewed and interpreted cardiac monitoring: {cardiac monitoring:28251} I personally reviewed and interpreted the pt's EKG: see above for interpretation  I have reviewed the patients home medications and made adjustments as needed Consults: {Consultants:28063} Social Determinants of health:  ***  Portions of this note were generated with Scientist, clinical (histocompatibility and immunogenetics). Dictation errors may occur despite best attempts at proofreading.     Final diagnoses:  None    ED Discharge Orders     None

## 2024-09-20 NOTE — ED Notes (Signed)
 Pt eating crackers and drinking unsweet tea without n/v/d at this time

## 2024-09-20 NOTE — ED Triage Notes (Signed)
 Patient was sent to ED from [facility] states she has had N/V abd. Pain onset 5 days ago.

## 2024-09-20 NOTE — ED Notes (Signed)
 BG rechecked, 194 at this time.

## 2024-09-20 NOTE — ED Triage Notes (Signed)
 Patient presents with a 6-day history of vomiting and diarrhea. Patient states she woke up wet in black stools.

## 2024-09-20 NOTE — Discharge Instructions (Addendum)
 You were seen for your abdominal pain and diarrhea in the emergency department.   At home, please stay well-hydrated.  Take the Protonix  to prevent stomach ulcers performing.  Take the Zofran  as needed for any nausea or vomiting that you may have.    Check your MyChart online for the results of any tests that had not resulted by the time you left the emergency department.   Follow-up with your primary doctor in 2-3 days regarding your visit.  Follow-up with GI about your diarrhea if it continues  Return immediately to the emergency department if you experience any of the following: Severe pain, fainting, or any other concerning symptoms.    Thank you for visiting our Emergency Department. It was a pleasure taking care of you today.

## 2024-09-21 LAB — CBG MONITORING, ED: Glucose-Capillary: 195 mg/dL — ABNORMAL HIGH (ref 70–99)

## 2024-10-06 ENCOUNTER — Other Ambulatory Visit (HOSPITAL_COMMUNITY): Payer: Self-pay | Admitting: Pharmacy Technician

## 2024-10-06 ENCOUNTER — Telehealth (HOSPITAL_COMMUNITY): Payer: Self-pay

## 2024-10-06 NOTE — Telephone Encounter (Signed)
 Auth Submission: NO AUTH NEEDED Site of care: Site of care: [facility] Payer: [employer] Federal Medication & CPT/J Code(s) submitted: Prolia  (Denosumab ) [id number] Diagnosis Code: M81.0 Route of submission (phone, fax, portal):  Phone # Fax # Auth type: Buy/Bill HB Units/visits requested: 60mg  q29months Reference number:  Approval from: 10/06/24 to 12/16/24

## 2024-11-02 ENCOUNTER — Ambulatory Visit (HOSPITAL_COMMUNITY)
Admission: RE | Admit: 2024-11-02 | Discharge: 2024-11-02 | Disposition: A | Discharge status: Home / Self Care | Source: Ambulatory Visit | Attending: Endocrinology | Admitting: Endocrinology

## 2024-11-02 VITALS — BP 109/57 | HR 91 | Temp 97.6°F | Resp 16

## 2024-11-02 DIAGNOSIS — Z79620 Long term (current) use of immunosuppressive biologic: Secondary | ICD-10-CM | POA: Diagnosis not present

## 2024-11-02 DIAGNOSIS — M810 Age-related osteoporosis without current pathological fracture: Secondary | ICD-10-CM | POA: Insufficient documentation

## 2024-11-02 MED ORDER — DENOSUMAB 60 MG/ML ~~LOC~~ SOSY
PREFILLED_SYRINGE | SUBCUTANEOUS | Status: AC
  Filled 2024-11-02: qty 1

## 2024-11-02 MED ORDER — DENOSUMAB 60 MG/ML ~~LOC~~ SOSY
60.0000 mg | PREFILLED_SYRINGE | Freq: Once | SUBCUTANEOUS | Status: AC
  Administered 2024-11-02: 60 mg via SUBCUTANEOUS

## 2024-12-02 ENCOUNTER — Ambulatory Visit: Admitting: Podiatry

## 2024-12-02 DIAGNOSIS — M79675 Pain in left toe(s): Secondary | ICD-10-CM | POA: Diagnosis not present

## 2024-12-02 DIAGNOSIS — M79674 Pain in right toe(s): Secondary | ICD-10-CM | POA: Diagnosis not present

## 2024-12-02 DIAGNOSIS — B351 Tinea unguium: Secondary | ICD-10-CM

## 2024-12-02 NOTE — Progress Notes (Unsigned)
  Subjective:  Patient ID: Adriana [name], female    DOB: 1946/06/24,  MRN: [id number]  Chief Complaint  Patient presents with   Nail Problem    Nail trim    78 y.o. female returns for the above complaint.  Patient presents with thickened and again dystrophic toenails x 10 mild pain on palpation hurts with ambulation worse with pressure would like to have them to be done she is not able to do it herself.  Denies any other acute complaints at this  Objective:   There were no vitals filed for this visit.  Podiatric Exam: Vascular: dorsalis pedis and posterior tibial pulses are palpable bilateral. Capillary return is immediate. Temperature gradient is WNL. Skin turgor WNL  Sensorium: Normal Semmes Weinstein monofilament test. Normal tactile sensation bilaterally. Nail Exam: Pt has thick disfigured discolored nails with subungual debris noted bilateral entire nail hallux through fifth toenails.  Pain on palpation to the nails. Ulcer Exam: There is no evidence of ulcer or pre-ulcerative changes or infection. Orthopedic Exam: Muscle tone and strength are WNL. No limitations in general ROM. No crepitus or effusions noted.  Skin: No Porokeratosis. No infection or ulcers    Assessment & Plan:   1. Pain due to onychomycosis of toenails of both feet       Patient was evaluated and treated and all questions answered.  Onychomycosis with pain  -Nails palliatively debrided as below. -Educated on self-care  Procedure: Nail Debridement Rationale: pain  Type of Debridement: manual, sharp debridement. Instrumentation: Nail nipper, rotary burr. Number of Nails: 10  Procedures and Treatment: Consent by patient was obtained for treatment procedures. The patient understood the discussion of treatment and procedures well. All questions were answered thoroughly reviewed. Debridement of mycotic and hypertrophic toenails, 1 through 5 bilateral and clearing of subungual debris. No ulceration,  no infection noted.  Return Visit-Office Procedure: Patient instructed to return to the office for a follow up visit 3 months for continued evaluation and treatment.  Tinnie Forehand, DPM    No follow-ups on file.

## 2025-03-03 ENCOUNTER — Ambulatory Visit: Admitting: Podiatry

## 2025-05-03 ENCOUNTER — Encounter (HOSPITAL_COMMUNITY)

## 2025-05-14 ENCOUNTER — Ambulatory Visit: Admitting: Cardiovascular Disease

## 2025-06-08 ENCOUNTER — Encounter (INDEPENDENT_AMBULATORY_CARE_PROVIDER_SITE_OTHER): Admitting: Ophthalmology
